# Patient Record
Sex: Female | Born: 1960 | Race: White | Hispanic: No | Marital: Married | Smoking: Former smoker
Health system: Southern US, Community
[De-identification: ages and names within clinical notes are randomized; demographics above are authoritative.]

## PROBLEM LIST (undated history)

## (undated) DIAGNOSIS — C801 Malignant (primary) neoplasm, unspecified: Secondary | ICD-10-CM

## (undated) DIAGNOSIS — F32A Depression, unspecified: Secondary | ICD-10-CM

## (undated) DIAGNOSIS — E039 Hypothyroidism, unspecified: Secondary | ICD-10-CM

## (undated) DIAGNOSIS — K5790 Diverticulosis of intestine, part unspecified, without perforation or abscess without bleeding: Secondary | ICD-10-CM

## (undated) DIAGNOSIS — K219 Gastro-esophageal reflux disease without esophagitis: Secondary | ICD-10-CM

## (undated) DIAGNOSIS — M1990 Unspecified osteoarthritis, unspecified site: Secondary | ICD-10-CM

## (undated) DIAGNOSIS — F329 Major depressive disorder, single episode, unspecified: Secondary | ICD-10-CM

## (undated) DIAGNOSIS — F419 Anxiety disorder, unspecified: Secondary | ICD-10-CM

## (undated) DIAGNOSIS — I10 Essential (primary) hypertension: Secondary | ICD-10-CM

## (undated) DIAGNOSIS — R51 Headache: Secondary | ICD-10-CM

## (undated) DIAGNOSIS — J449 Chronic obstructive pulmonary disease, unspecified: Secondary | ICD-10-CM

## (undated) DIAGNOSIS — R0602 Shortness of breath: Secondary | ICD-10-CM

## (undated) HISTORY — PX: OTHER SURGICAL HISTORY: SHX169

## (undated) HISTORY — PX: INNER EAR SURGERY: SHX679

## (undated) HISTORY — PX: HEMORRHOID SURGERY: SHX153

## (undated) HISTORY — PX: ABDOMINAL HYSTERECTOMY: SHX81

## (undated) HISTORY — PX: COLONOSCOPY W/ BIOPSIES AND POLYPECTOMY: SHX1376

---

## 2011-04-02 ENCOUNTER — Other Ambulatory Visit: Payer: Self-pay | Admitting: Obstetrics and Gynecology

## 2011-04-02 DIAGNOSIS — Z1231 Encounter for screening mammogram for malignant neoplasm of breast: Secondary | ICD-10-CM

## 2011-04-02 DIAGNOSIS — M1990 Unspecified osteoarthritis, unspecified site: Secondary | ICD-10-CM

## 2011-04-17 ENCOUNTER — Ambulatory Visit
Admission: RE | Admit: 2011-04-17 | Discharge: 2011-04-17 | Disposition: A | Discharge status: Home / Self Care | Payer: BC Managed Care – PPO | Source: Ambulatory Visit | Attending: Obstetrics and Gynecology | Admitting: Obstetrics and Gynecology

## 2011-04-17 DIAGNOSIS — M1990 Unspecified osteoarthritis, unspecified site: Secondary | ICD-10-CM

## 2011-04-17 DIAGNOSIS — Z1231 Encounter for screening mammogram for malignant neoplasm of breast: Secondary | ICD-10-CM

## 2013-01-14 ENCOUNTER — Inpatient Hospital Stay (HOSPITAL_COMMUNITY): Payer: BC Managed Care – PPO

## 2013-01-14 ENCOUNTER — Inpatient Hospital Stay (HOSPITAL_COMMUNITY)
Admission: RE | Admit: 2013-01-14 | Discharge: 2013-01-15 | DRG: 086 | Disposition: A | Discharge status: Home / Self Care | Payer: BC Managed Care – PPO | Source: Other Acute Inpatient Hospital | Attending: Internal Medicine | Admitting: Internal Medicine

## 2013-01-14 ENCOUNTER — Encounter (HOSPITAL_COMMUNITY): Payer: Self-pay | Admitting: *Deleted

## 2013-01-14 DIAGNOSIS — F3289 Other specified depressive episodes: Secondary | ICD-10-CM

## 2013-01-14 DIAGNOSIS — I10 Essential (primary) hypertension: Secondary | ICD-10-CM

## 2013-01-14 DIAGNOSIS — IMO0002 Reserved for concepts with insufficient information to code with codable children: Secondary | ICD-10-CM

## 2013-01-14 DIAGNOSIS — F329 Major depressive disorder, single episode, unspecified: Secondary | ICD-10-CM | POA: Diagnosis present

## 2013-01-14 DIAGNOSIS — J90 Pleural effusion, not elsewhere classified: Secondary | ICD-10-CM

## 2013-01-14 DIAGNOSIS — Z720 Tobacco use: Secondary | ICD-10-CM | POA: Diagnosis present

## 2013-01-14 DIAGNOSIS — C3490 Malignant neoplasm of unspecified part of unspecified bronchus or lung: Secondary | ICD-10-CM | POA: Diagnosis present

## 2013-01-14 DIAGNOSIS — K219 Gastro-esophageal reflux disease without esophagitis: Secondary | ICD-10-CM | POA: Diagnosis present

## 2013-01-14 DIAGNOSIS — R599 Enlarged lymph nodes, unspecified: Secondary | ICD-10-CM | POA: Diagnosis present

## 2013-01-14 DIAGNOSIS — F411 Generalized anxiety disorder: Secondary | ICD-10-CM | POA: Diagnosis present

## 2013-01-14 DIAGNOSIS — Z87891 Personal history of nicotine dependence: Secondary | ICD-10-CM

## 2013-01-14 DIAGNOSIS — R590 Localized enlarged lymph nodes: Secondary | ICD-10-CM

## 2013-01-14 DIAGNOSIS — E039 Hypothyroidism, unspecified: Secondary | ICD-10-CM

## 2013-01-14 DIAGNOSIS — R918 Other nonspecific abnormal finding of lung field: Secondary | ICD-10-CM

## 2013-01-14 HISTORY — DX: Gastro-esophageal reflux disease without esophagitis: K21.9

## 2013-01-14 HISTORY — DX: Major depressive disorder, single episode, unspecified: F32.9

## 2013-01-14 HISTORY — DX: Hypothyroidism, unspecified: E03.9

## 2013-01-14 HISTORY — DX: Essential (primary) hypertension: I10

## 2013-01-14 HISTORY — DX: Anxiety disorder, unspecified: F41.9

## 2013-01-14 HISTORY — DX: Depression, unspecified: F32.A

## 2013-01-14 LAB — COMPREHENSIVE METABOLIC PANEL
ALT: 48 U/L — ABNORMAL HIGH (ref 0–35)
Alkaline Phosphatase: 100 U/L (ref 39–117)
CO2: 25 mEq/L (ref 19–32)
Chloride: 95 mEq/L — ABNORMAL LOW (ref 96–112)
GFR calc Af Amer: >90 mL/min (ref >90)
GFR calc non Af Amer: >90 mL/min (ref >90)
Glucose, Bld: 86 mg/dL (ref 70–99)
Potassium: 4 mEq/L (ref 3.5–5.1)
Sodium: 133 mEq/L — ABNORMAL LOW (ref 135–145)
Total Bilirubin: 0.2 mg/dL — ABNORMAL LOW (ref 0.3–1.2)
Total Protein: 6.2 g/dL (ref 6.0–8.3)

## 2013-01-14 LAB — BODY FLUID CELL COUNT WITH DIFFERENTIAL
Eos, Fluid: 2 %
Monocyte-Macrophage-Serous Fluid: 4 % — ABNORMAL LOW (ref 50–90)
Neutrophil Count, Fluid: 38 % — ABNORMAL HIGH (ref 0–25)
Total Nucleated Cell Count, Fluid: 1160 cu mm — ABNORMAL HIGH (ref 0–1000)

## 2013-01-14 LAB — CBC WITH DIFFERENTIAL/PLATELET
Basophils Absolute: 0 10*3/uL (ref 0.0–0.1)
Basophils Relative: 0 % (ref 0–1)
HCT: 38.2 % (ref 36.0–46.0)
MCHC: 33.8 g/dL (ref 30.0–36.0)
Monocytes Absolute: 1.5 10*3/uL — ABNORMAL HIGH (ref 0.1–1.0)
Neutro Abs: 11.5 10*3/uL — ABNORMAL HIGH (ref 1.7–7.7)
Platelets: 290 10*3/uL (ref 150–400)
RDW: 13.8 % (ref 11.5–15.5)

## 2013-01-14 LAB — TSH: TSH: 0.824 u[IU]/mL (ref 0.350–4.500)

## 2013-01-14 LAB — MRSA PCR SCREENING: MRSA by PCR: NEGATIVE

## 2013-01-14 MED ORDER — ONDANSETRON HCL 4 MG/2ML IJ SOLN
4.0000 mg | Freq: Four times a day (QID) | INTRAMUSCULAR | Status: DC | PRN
  Administered 2013-01-15: 4 mg via INTRAVENOUS
  Filled 2013-01-14: qty 2

## 2013-01-14 MED ORDER — OXYBUTYNIN CHLORIDE ER 10 MG PO TB24
10.0000 mg | ORAL_TABLET | Freq: Every day | ORAL | Status: DC
  Administered 2013-01-14: 10 mg via ORAL
  Filled 2013-01-14 (×2): qty 1

## 2013-01-14 MED ORDER — IPRATROPIUM BROMIDE 0.02 % IN SOLN
0.5000 mg | Freq: Four times a day (QID) | RESPIRATORY_TRACT | Status: DC
  Administered 2013-01-14 – 2013-01-15 (×6): 0.5 mg via RESPIRATORY_TRACT
  Filled 2013-01-14 (×6): qty 2.5

## 2013-01-14 MED ORDER — ALBUTEROL SULFATE (5 MG/ML) 0.5% IN NEBU: 2.5000 mg | INHALATION_SOLUTION | RESPIRATORY_TRACT | Status: DC | PRN

## 2013-01-14 MED ORDER — HYDROMORPHONE HCL PF 1 MG/ML IJ SOLN
2.0000 mg | INTRAMUSCULAR | Status: DC | PRN
  Administered 2013-01-14: 1 mg via INTRAVENOUS
  Filled 2013-01-14: qty 1

## 2013-01-14 MED ORDER — ENSURE COMPLETE PO LIQD
237.0000 mL | Freq: Two times a day (BID) | ORAL | Status: DC
  Administered 2013-01-15 (×2): 237 mL via ORAL

## 2013-01-14 MED ORDER — HYDROMORPHONE HCL PF 1 MG/ML IJ SOLN: 1.0000 mg | INTRAMUSCULAR | Status: DC | PRN

## 2013-01-14 MED ORDER — HYDROCODONE-ACETAMINOPHEN 5-325 MG PO TABS
1.0000 | ORAL_TABLET | Freq: Four times a day (QID) | ORAL | Status: DC | PRN
  Administered 2013-01-14 – 2013-01-15 (×4): 2 via ORAL
  Filled 2013-01-14 (×4): qty 2

## 2013-01-14 MED ORDER — HYDRALAZINE HCL 20 MG/ML IJ SOLN: 10.0000 mg | INTRAMUSCULAR | Status: DC | PRN

## 2013-01-14 MED ORDER — SODIUM CHLORIDE 0.9 % IV SOLN
INTRAVENOUS | Status: AC
  Administered 2013-01-14: 06:00:00 via INTRAVENOUS

## 2013-01-14 MED ORDER — ACETAMINOPHEN 325 MG PO TABS: 650.0000 mg | ORAL_TABLET | Freq: Four times a day (QID) | ORAL | Status: DC | PRN

## 2013-01-14 MED ORDER — AZITHROMYCIN 500 MG IV SOLR
500.0000 mg | INTRAVENOUS | Status: DC
  Administered 2013-01-14 – 2013-01-15 (×2): 500 mg via INTRAVENOUS
  Filled 2013-01-14 (×3): qty 500

## 2013-01-14 MED ORDER — ACETAMINOPHEN 650 MG RE SUPP: 650.0000 mg | Freq: Four times a day (QID) | RECTAL | Status: DC | PRN

## 2013-01-14 MED ORDER — DEXTROSE 5 % IV SOLN
1.0000 g | INTRAVENOUS | Status: DC
  Administered 2013-01-14 – 2013-01-15 (×2): 1 g via INTRAVENOUS
  Filled 2013-01-14 (×3): qty 10

## 2013-01-14 MED ORDER — SODIUM CHLORIDE 0.9 % IJ SOLN
3.0000 mL | Freq: Two times a day (BID) | INTRAMUSCULAR | Status: DC
  Administered 2013-01-14 – 2013-01-15 (×2): 3 mL via INTRAVENOUS

## 2013-01-14 MED ORDER — ADULT MULTIVITAMIN W/MINERALS CH
1.0000 | ORAL_TABLET | Freq: Every day | ORAL | Status: DC
  Administered 2013-01-14 – 2013-01-15 (×2): 1 via ORAL
  Filled 2013-01-14 (×2): qty 1

## 2013-01-14 MED ORDER — LEVOTHYROXINE SODIUM 50 MCG PO TABS
50.0000 ug | ORAL_TABLET | Freq: Every day | ORAL | Status: DC
  Administered 2013-01-15: 50 ug via ORAL
  Filled 2013-01-14 (×2): qty 1

## 2013-01-14 MED ORDER — ALBUTEROL SULFATE (5 MG/ML) 0.5% IN NEBU
2.5000 mg | INHALATION_SOLUTION | Freq: Four times a day (QID) | RESPIRATORY_TRACT | Status: DC
  Administered 2013-01-14 – 2013-01-15 (×6): 2.5 mg via RESPIRATORY_TRACT
  Filled 2013-01-14 (×6): qty 0.5

## 2013-01-14 MED ORDER — IOHEXOL 300 MG/ML  SOLN
80.0000 mL | Freq: Once | INTRAMUSCULAR | Status: AC | PRN
  Administered 2013-01-14: 80 mL via INTRAVENOUS

## 2013-01-14 MED ORDER — ATENOLOL 50 MG PO TABS
75.0000 mg | ORAL_TABLET | Freq: Two times a day (BID) | ORAL | Status: DC
  Administered 2013-01-14 – 2013-01-15 (×2): 75 mg via ORAL
  Filled 2013-01-14 (×3): qty 1

## 2013-01-14 MED ORDER — ONDANSETRON HCL 4 MG PO TABS: 4.0000 mg | ORAL_TABLET | Freq: Four times a day (QID) | ORAL | Status: DC | PRN

## 2013-01-14 NOTE — H&P (Signed)
[facility] History and Physical  Jodi Shepherd WUJ:[id number] DOB: 1960/10/01 DOA: 01/14/2013  Referring physician: Patient was transferred from [place]. PCP: No PCP Per Patient   Chief Complaint: Shortness of breath and bilateral bloody pleural effusions.  HPI: Jodi Shepherd is a 52 y.o. female with history of hypertension, hypothyroidism and depression was admitted at [facility] in [place] for shortness of breath and was found to have bilateral pleural effusion loculated with bilateral airspace disease. Patient was started on empiric antibiotics and had CT chest the results of which are not available and eventually patient underwent right-sided thoracentesis followed by left-sided thoracentesis each day and patient has been transferred to [facility] for further management. As per the report received patient had bloody pleural effusion. Patient states that she has been having shortness of breath for some time over the last few weeks but acutely worsened since May 13. Patient also has been having pleuritic type of chest pain. She went to the hospital 3 days ago. Patient denies any productive cough or any fever chills. Has noticed 30 pounds weight loss in last 3 months. Denies any nausea vomiting diarrhea. Has not had any joint swellings. Denies any insect bite or any foreign travel. Had no contact with people with TB.   Labs done at the other hospital showed hemoglobin of 4.2 WBC of 14.3 platelets of 310 creatinine 0.6 glucose 101 albumin of 2 and chest x-ray was read as bilateral loculated pleural effusion with bilateral airspace disease. CT chest results are not available.  Review of Systems: As presented in the history of presenting illness, rest negative.  Past Medical History  Diagnosis Date  . Hypertension   . Hypothyroidism   . Anxiety   . Depression   . GERD (gastroesophageal reflux disease)    Past Surgical History  Procedure Laterality Date  . Abdominal  hysterectomy    . Inner ear surgery     Social History:  reports that she quit smoking about 2 weeks ago. Her smoking use included Cigarettes. She smoked 0.00 packs per day. She has never used smokeless tobacco. She reports that she does not drink alcohol or use illicit drugs. Lives at home. where does patient live-- Can do ADLs. Can patient participate in ADLs?  No Known Allergies  Family History  Problem Relation Age of Onset  . Diabetes Mellitus II Father       Prior to Admission medications   Not on File   Physical Exam: Filed Vitals:   01/14/13 0236 01/14/13 0242  BP: 137/69   Pulse: 71 72  Temp: 98.7 F (37.1 C)   TempSrc: Oral   Resp: 24 16  Height: 5\' 5"  (1.651 m)   Weight: 76 kg (167 lb 8.8 oz)   SpO2: 89% 91%     General:  Well-developed and nourished.  Eyes: Anicteric no pallor.  ENT: No discharge from the ears eyes nose mouth.  Neck: No mass felt.  Cardiovascular: S1-S2 heard.  Respiratory: No rhonchi or crepitations.  Abdomen: Soft nontender bowel sounds present.  Skin: No rash.  Musculoskeletal: No edema.  Psychiatric: Appears normal.  Neurologic: Alert awake oriented to time place and person. Moves all extremities.  Labs on Admission:  Basic Metabolic Panel: No results found for this basename: NA, K, CL, CO2, GLUCOSE, BUN, CREATININE, CALCIUM, MG, PHOS,  in the last 168 hours Liver Function Tests: No results found for this basename: AST, ALT, ALKPHOS, BILITOT, PROT, ALBUMIN,  in the last 168 hours No results found for  this basename: LIPASE, AMYLASE,  in the last 168 hours No results found for this basename: AMMONIA,  in the last 168 hours CBC: No results found for this basename: WBC, NEUTROABS, HGB, HCT, MCV, PLT,  in the last 168 hours Cardiac Enzymes: No results found for this basename: CKTOTAL, CKMB, CKMBINDEX, TROPONINI,  in the last 168 hours  BNP (last 3 results) No results found for this basename: PROBNP,  in the last 8760  hours CBG: No results found for this basename: GLUCAP,  in the last 168 hours  Radiological Exams on Admission: No results found.   Assessment/Plan Principal Problem:   Pleural effusion, bilateral Active Problems:   HTN (hypertension)   Depression   Hypothyroidism   Tobacco abuse   1. Bilateral bloody pleural effusion loculated with possible bilateral airspace disease and mediastinal adenopathy - at this time I have discussed with on-call pulmonologist. Patient will be continued on antibiotics. Patient's thoracentesis fluid is available done from the hospital. This will be sent for cytology. Further recommendations per pulmonary. I have ordered a repeat chest x-ray. We need to get patient's blood culture reports and CT reports from [facility] in [place]. 2. Hypertension - need to verify home medications and start. For now I have placed patient on when necessary IV hydralazine for systolic blood pressure more than 160. 3. Hypothyroidism - continue Synthroid check TSH. 4. History of depression - continue home medications. 5. Tobacco abuse - patient recently quit after patient was having shortness of breath.  Patient's labs and chest x-ray are pending.    Code Status: Full code.  Family Communication: None.  Disposition Plan: Admit to inpatient.    Jazz Biddy N. [facility] Pager [phone].  If 7PM-7AM, please contact night-coverage [email or web address] Password [facility] 01/14/2013, 4:26 AM

## 2013-01-14 NOTE — Progress Notes (Signed)
[facility] Progress Note  TEAM 1 - Stepdown/ICU TEAM   Talana Slatten ZOX:[id number] DOB: 10-Jun-1961 DOA: 01/14/2013 PCP: No PCP Per Patient  Brief narrative: Jodi Shepherd is a 52 y.o. female with history of hypertension, hypothyroidism and depression was admitted at [facility] in [place] for shortness of breath and was found to have bilateral pleural effusion loculated with bilateral airspace disease. Patient was started on empiric antibiotics and had CT chest the results of which are not available and eventually patient underwent right-sided thoracentesis followed by left-sided thoracentesis each day and patient has been transferred to [facility] for further management. As per the report received patient had bloody pleural effusion. Patient states that she has been having shortness of breath for some time over the last few weeks but acutely worsened since May 13. Patient also has been having pleuritic type of chest pain. She went to the hospital 3 days ago. Patient denies any productive cough or any fever chills. Has noticed 30 pounds weight loss in last 3 months. Denies any nausea vomiting diarrhea. Has not had any joint swellings. Denies any insect bite or any foreign travel. Had no contact with people with TB.   Assessment/Plan: Principal Problem:   Pleural effusion, bilateral/ lymphadenopathy/ lung mass with mets - pt understands that diagnosis is likely cancer and would like to go home from the hospital and have outpt work up and treatment- not very keen on getting chemo but amenable to radiation if needed.  - await cytology from pleural fluid -understands that pleural fluid may re-collect rapidly - titrate down O2- discussed need for home O2 with case manager  Active Problems:   HTN (hypertension) -resume Atenolol    Hypothyroidism -resume synthroid    Tobacco abuse -quit 2 wks ago    Code Status: full code Family Communication: none Disposition Plan:  follow in SDU  Consultants: pulm  Procedures: none  Antibiotics: Rocephin/ Zithromax   DVT prophylaxis: SCDs  HPI/Subjective: Cough present/ no significant dyspnea or pain today   Objective: Blood pressure 150/73, pulse 85, temperature 97.8 F (36.6 C), temperature source Oral, resp. rate 11, height 5\' 5"  (1.651 m), weight 76 kg (167 lb 8.8 oz), SpO2 96.00%.  Intake/Output Summary (Last 24 hours) at 01/14/13 2152 Last data filed at 01/14/13 2000  Gross per 24 hour  Intake 1307.5 ml  Output    550 ml  Net  757.5 ml     Exam: General: No acute respiratory distress Lungs: b/l basilar crackles Cardiovascular: Regular rate and rhythm without murmur gallop or rub normal S1 and S2 Abdomen: Nontender, nondistended, soft, bowel sounds positive, no rebound, no ascites, no appreciable mass Extremities: No significant cyanosis, clubbing, or edema bilateral lower extremities  Data Reviewed: Basic Metabolic Panel:  Recent Labs Lab 01/14/13 0530  NA 133*  K 4.0  CL 95*  CO2 25  GLUCOSE 86  BUN 8  CREATININE 0.34*  CALCIUM 8.7   Liver Function Tests:  Recent Labs Lab 01/14/13 0530  AST 25  ALT 48*  ALKPHOS 100  BILITOT 0.2*  PROT 6.2  ALBUMIN 2.1*   No results found for this basename: LIPASE, AMYLASE,  in the last 168 hours No results found for this basename: AMMONIA,  in the last 168 hours CBC:  Recent Labs Lab 01/14/13 0530  WBC 14.7*  NEUTROABS 11.5*  HGB 12.9  HCT 38.2  MCV 85.5  PLT 290   Cardiac Enzymes: No results found for this basename: CKTOTAL, CKMB, CKMBINDEX, TROPONINI,  in the last 168 hours BNP (last 3 results) No results found for this basename: PROBNP,  in the last 8760 hours CBG: No results found for this basename: GLUCAP,  in the last 168 hours  Recent Results (from the past 240 hour(s))  MRSA PCR SCREENING     Status: None   Collection Time    01/14/13  2:37 AM      Result Value Range Status   MRSA by PCR NEGATIVE  NEGATIVE  Final   Comment:            The GeneXpert MRSA Assay (FDA     approved for NASAL specimens     only), is one component of a     comprehensive MRSA colonization     surveillance program. It is not     intended to diagnose MRSA     infection nor to guide or     monitor treatment for     MRSA infections.     Studies:  Recent x-ray studies have been reviewed in detail by the Attending Physician  Scheduled Meds:  Scheduled Meds: . albuterol  2.5 mg Nebulization Q6H  . azithromycin  500 mg Intravenous Q24H  . cefTRIAXone (ROCEPHIN)  IV  1 g Intravenous Q24H  . feeding supplement  237 mL Oral BID BM  . ipratropium  0.5 mg Nebulization Q6H  . multivitamin with minerals  1 tablet Oral Daily  . sodium chloride  3 mL Intravenous Q12H   Continuous Infusions: . sodium chloride 50 mL/hr at 01/14/13 1900    Time spent on care of this patient: 35 min   Dulse Rutan,MD [phone]  [facility] Office  [phone] Pager - Text Page per Loretha Stapler as per below:  On-Call/Text Page:      [email or web address]      password [email or web address]  If 7PM-7AM, please contact night-coverage [email or web address] Password [facility] 01/14/2013, 9:52 PM   LOS: 0 days

## 2013-01-14 NOTE — Progress Notes (Signed)
INITIAL NUTRITION ASSESSMENT  DOCUMENTATION CODES Per approved criteria  -Not Applicable   INTERVENTION: Provide Ensure Complete po BID, each supplement provides 350 kcal and 13 grams of protein. Provide Multivitamin with minerals daily  NUTRITION DIAGNOSIS: Inadequate oral intake related to poor appetite as evidenced by pt's report of no PO intake today and 1 meal yesterday.   Goal: Pt to meet >/= 90% of their estimated nutrition needs  Monitor:  PO intake Weight Labs  Reason for Assessment: Malnutrition Screening Tool, Score of 4  52 y.o. female  Admitting Dx: Pleural effusion, bilateral  ASSESSMENT: 52 y.o. female with history of hypertension, hypothyroidism and depression was admitted at [facility] in [place] for shortness of breath and was found to have bilateral pleural effusion loculated with bilateral airspace disease. Patient was started on empiric antibiotics and had CT chest the results of which are not available and eventually patient underwent right-sided thoracentesis followed by left-sided thoracentesis each day and patient has been transferred to [facility] for further management.   Pt reports that she has had a poor appetite for the past 2 days with nothing to eat today and only 1 meal yesterday. Pt states that she never eats much but, is eating less than usual. Pt reports that her usual body weight is 170 lbs. Pt states that she eats healthy at home and she has no further questions or concerns at this time.   Height: Ht Readings from Last 1 Encounters:  01/14/13 5\' 5"  (1.651 m)    Weight: Wt Readings from Last 1 Encounters:  01/14/13 167 lb 8.8 oz (76 kg)    Ideal Body Weight: 125 lbs  % Ideal Body Weight: 134%  Wt Readings from Last 10 Encounters:  01/14/13 167 lb 8.8 oz (76 kg)    Usual Body Weight: 170 lbs  % Usual Body Weight: 98%  BMI:  Body mass index is 27.88 kg/(m^2).  Estimated Nutritional Needs: Kcal: 1900-2050 Protein:  76-91 grams Fluid: 2.3 L  Skin: dry, intact; incision on back  Diet Order: Cardiac  EDUCATION NEEDS: -No education needs identified at this time   Intake/Output Summary (Last 24 hours) at 01/14/13 1354 Last data filed at 01/14/13 0951  Gross per 24 hour  Intake  442.5 ml  Output    550 ml  Net -107.5 ml    Last BM: 5/24   Labs:   Recent Labs Lab 01/14/13 0530  NA 133*  K 4.0  CL 95*  CO2 25  BUN 8  CREATININE 0.34*  CALCIUM 8.7  GLUCOSE 86    CBG (last 3)  No results found for this basename: GLUCAP,  in the last 72 hours  Scheduled Meds: . albuterol  2.5 mg Nebulization Q6H  . azithromycin  500 mg Intravenous Q24H  . cefTRIAXone (ROCEPHIN)  IV  1 g Intravenous Q24H  . ipratropium  0.5 mg Nebulization Q6H  . sodium chloride  3 mL Intravenous Q12H    Continuous Infusions: . sodium chloride 50 mL/hr at 01/14/13 0533    Past Medical History  Diagnosis Date  . Hypertension   . Hypothyroidism   . Anxiety   . Depression   . GERD (gastroesophageal reflux disease)     Past Surgical History  Procedure Laterality Date  . Abdominal hysterectomy    . Inner ear surgery      Ian Malkin RD, LDN Inpatient Clinical Dietitian Pager: [phone] After Hours Pager: [phone]

## 2013-01-14 NOTE — Progress Notes (Signed)
PT transferred from [facility] via [facility]. Pt Ax4, able to move self to bed, sats 92% on 5L O2, all other VVS. Pt transferred with approximately 1500cc of thoracic fluid from thoracentesis earlier on 5/23. Pt having 8/10 mid upper back pain. Admitting MD paged. Will continue to monitor.

## 2013-01-14 NOTE — Consult Note (Signed)
PULMONARY  / CRITICAL CARE MEDICINE  Name: Jodi Shepherd MRN: [id number] DOB: 1961/04/03    ADMISSION DATE:  01/14/2013 CONSULTATION DATE:  01/14/13  REFERRING MD :  Eduard Clos, MD PRIMARY SERVICE: [facility]  CHIEF COMPLAINT:  Pleural Effusions  BRIEF PATIENT DESCRIPTION: 52 yo former smoker with bilateral lymphocytic bloody pleural effusions.   CULTURES: None  ANTIBIOTICS: Ceftriaxone 5/21 >> Azithromycin 5/21 >>>  HISTORY OF PRESENT ILLNESS:  52 yo F transferred from OSH for evaluation and management of bilateral pleural effusions.  She presented to [facility] in [place] on 5/21. CXR showed bilateral pleural effusions.  She was started on ceftriaxone and azithromycin.  CT scan showed "multiple pulmonary and hilar masses with pleural effusions".  Right thora performed on 5/22 with 1300 ml of bloody fluid removed.  Left performed yesterday and pleural fluid was sent with her on transfer which appears serosanguinous.   Her shortness of breath started at the beginning of this month and progressed.  She has had ~ 25 lbs of weight loss over 6 months and has had pleuritic chest pain. She has a chronic cough which has not worsened and not been productive. She denies fever, chills, night sweats, or hemoptysis.  She denies TB and HIV risk factors or known exposure.  She denies recent sick contacts. 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 She quit smoking earlier this month.  PAST MEDICAL HISTORY :  Past Medical History  Diagnosis Date  . Hypertension   . Hypothyroidism   . Anxiety   . Depression   . GERD (gastroesophageal reflux disease)    Past Surgical History  Procedure Laterality Date  . Abdominal hysterectomy    . Inner ear surgery     Prior to Admission medications   Not on File   No Known Allergies  FAMILY HISTORY:  Family History  Problem Relation Age of Onset  . Diabetes Mellitus II Father    SOCIAL  HISTORY:  She reports that she quit smoking about 2 weeks ago. Her smoking use included Cigarettes. She smoked up to 1.5 packs per day. She has never used smokeless tobacco. She reports that she does not drink alcohol or use illicit drugs.  REVIEW OF SYSTEMS:  Review of 10 systems negative except as listed in HPI  SUBJECTIVE:   VITAL SIGNS: Temp:  [98.7 F (37.1 C)] 98.7 F (37.1 C) (05/24 0236) Pulse Rate:  [71-72] 72 (05/24 0242) Resp:  [16-24] 16 (05/24 0242) BP: (137)/(69) 137/69 mmHg (05/24 0236) SpO2:  [89 %-91 %] 91 % (05/24 0242) Weight:  [76 kg (167 lb 8.8 oz)] 76 kg (167 lb 8.8 oz) (05/24 0236)   PHYSICAL EXAMINATION: General:  Sitting in bed, NAD Neuro:  A&Ox3, no focal deficits HEENT:  Sclera clear, MMM, no oral lesions, EOMI Neck:Supple, no thyromegaly or cervical LAD Cardiovascular:  RRR no m/r/g Lungs: Decreased in bases Abdomen:  Soft, NT, ND, normal BS Musculoskeletal:  Joints wnl Skin:  No rash   LABS: No results found for this basename: HGB, WBC, PLT, NA, K, CL, CO2, GLUCOSE, BUN, CREATININE, CALCIUM, MG, PHOS, AST, ALT, ALKPHOS, BILITOT, PROT, ALBUMIN, APTT, INR, LATICACIDVEN, TROPONINI, PROCALCITON, PROBNP, O2SATVEN, PHART, PCO2ART, PO2ART,  in the last 168 hours No results found for this basename: GLUCAP,  in the last 168 hours  CXR: pending  OSH CXR report - loculated bilateral pleural effusions with bibasilar airspace disease  CT scan report not provided - per  notes  "multiple pulmonary and hilar masses with pleural effusions"  Disc with OSH imaging does not seem to have any imaged  OSH pleural fluid cell count Red, slightly cloudy 6.7 WBC 38% PMNs 52% lymphs  ASSESSMENT / PLAN:  52 yo F with bilateral bloody lymphocytic pleural effusions.  Given OSH radiology reports and clinical history appear most likely to be malignant.  Alternative DDX that can cause this type of effusion include parapneumonic, TB and rhematologic disease which all seem  less likely based on history and reports.  We will need to repeat imaging here to confirm OSH findings.  Recs: - Agree with CAP coverage for now - Fluid sent from OSH sent for cytology, cell count, LD, protein and ADA. - Will repeat CT scan as previous unavailable and to evaulate for improvement following previous thoracenteses. - If cytology is positive would provide diagnosis and staging.  I have personally obtained a history, examined the patient, evaluated laboratory and imaging results, formulated the assessment and plan and placed orders.   Daniel Ritthaler M.D. Pulmonary and Critical Care Medicine [facility] Pager: [phone]  01/14/2013, 5:42 AM

## 2013-01-14 NOTE — Progress Notes (Signed)
Patient desat 87-88% on 4L O2 via [place] increased to 5L via [place] with minimal change 87%. Increased to venti mask and escalated 40% O2 to 50%. Patient SpO2 response 88-89%.  Currently patient on non rebreather SpO2 94-96%. Patient not exhibiting overt signs of distress and denies any difficulty breathing. Notified respiratory for additional interventions and notified Dr. Butler Denmark of assessment and interventions

## 2013-01-15 ENCOUNTER — Inpatient Hospital Stay (HOSPITAL_COMMUNITY): Payer: BC Managed Care – PPO

## 2013-01-15 DIAGNOSIS — R599 Enlarged lymph nodes, unspecified: Secondary | ICD-10-CM

## 2013-01-15 DIAGNOSIS — R222 Localized swelling, mass and lump, trunk: Secondary | ICD-10-CM

## 2013-01-15 DIAGNOSIS — C3490 Malignant neoplasm of unspecified part of unspecified bronchus or lung: Secondary | ICD-10-CM | POA: Diagnosis present

## 2013-01-15 MED ORDER — ALPRAZOLAM 0.25 MG PO TABS
0.2500 mg | ORAL_TABLET | Freq: Three times a day (TID) | ORAL | Status: DC | PRN
  Administered 2013-01-15 (×2): 0.25 mg via ORAL
  Filled 2013-01-15 (×2): qty 1

## 2013-01-15 MED ORDER — ALPRAZOLAM 0.25 MG PO TABS: 0.2500 mg | ORAL_TABLET | Freq: Three times a day (TID) | ORAL | Status: AC | PRN

## 2013-01-15 MED ORDER — OXYCODONE HCL 5 MG PO TABS
5.0000 mg | ORAL_TABLET | ORAL | Status: DC | PRN
  Administered 2013-01-15: 10 mg via ORAL
  Filled 2013-01-15: qty 2

## 2013-01-15 NOTE — Progress Notes (Signed)
Subjective: Pt stable overnight.  No increased wob.  2  Objective: Vital signs in last 24 hours: Blood pressure 119/64, pulse 63, temperature 98.3 F (36.8 C), temperature source Oral, resp. rate 18, height 5\' 5"  (1.651 m), weight 76 kg (167 lb 8.8 oz), SpO2 92.00%.  Intake/Output from previous day: 05/24 0701 - 05/25 0700 In: 957.5 [P.O.:360; I.V.:597.5] Out: 0    Physical Exam:   overweight female in nad Nose without purulence or d/c OP clear Neck with +left cervical LN, no TMG Chest with crackles both bases, no wheezing Cor with rrr abd soft, nt/nd, bs+ LE without edema, no cyanosis Alert and oriented, moves all 4.    Lab Results:  Recent Labs  01/14/13 0530  WBC 14.7*  HGB 12.9  HCT 38.2  PLT 290   BMET  Recent Labs  01/14/13 0530  NA 133*  K 4.0  CL 95*  CO2 25  GLUCOSE 86  BUN 8  CREATININE 0.34*  CALCIUM 8.7    Studies/Results: Ct Chest W Contrast  01/14/2013   *RADIOLOGY REPORT*  Clinical Data: Malignancy suspected.  Abnormal radiograph.  CT CHEST WITH CONTRAST  Technique:  Multidetector CT imaging of the chest was performed following the standard protocol during bolus administration of intravenous contrast.  Contrast: 80mL OMNIPAQUE IOHEXOL 300 MG/ML  SOLN  Comparison: None.  Findings: Abnormal cervical and mediastinal adenopathy is present. 2.0 cm right level IV cervical node.  1.8 cm left level IV cervical node.  Several mediastinal nodes at the thoracic inlet.  2.9 cm right nodal mass short axis diameter on image 19.  1.7 cm prevascular node on image 20.  2.6 cm prevascular node on image 24. 3.8 cm subcarinal lymph node mass.  2.4 cm left hilar lymph node. 1.9 cm precarinal lymph node.  1.2 cm right hilar node.  10 mm right pericardiophrenic angle node. Bilateral and large supraclavicular nodes.  Abnormal adenopathy about the celiac axis in the upper abdomen is suspected.  Bilateral small adrenal nodules are worrisome for metastatic disease.  Bilateral  pleural effusions are present.  They extend anteriorly suggesting complicated pleural effusion that one would have with malignancy.  There is a small loculated retrosternal fluid collection on image 22 extending inferiorly of unknown significance.  This may represent a segment of the loculated pleural effusion.  Advanced centrilobular emphysema.  There is volume loss at both lung bases associated with heterogeneous pulmonary opacities. There is no dominant lung mass.  The left hilar mass designated  as a lymph node may actually represent the primary lung mass.  No destructive bone lesion.  Ground-glass opacity and interlobular septal thickening in both upper lobes anteriorly is present worse on the left.  Lymphangitic spread of tumor cannot be excluded.  IMPRESSION: The above findings are compatible with advanced thoracic malignancy characterized by mediastinal adenopathy, neck adenopathy, abdominal metastatic disease, pulmonary disease, and pleural disease.  Neck adenopathy is amenable to percutaneous biopsy.   Original Report Authenticated By: Jolaine Click, M.D.   Dg Chest Port 1 View  01/15/2013   *RADIOLOGY REPORT*  Clinical Data: Follow-up effusions  PORTABLE CHEST - 1 VIEW  Comparison: 01/14/2013  Findings: Artifact overlies the chest.  Bilateral pleural effusions persist, the same more minimally larger.  There is associated atelectasis in the lower lungs.  Vascularity shows venous hypertension possibly with early interstitial edema.  The aorta is unfolded.  There is mediastinal widening.  IMPRESSION: Persistent bilateral effusions, possibly minimally larger.  Lower lobe atelectasis and/or infiltrate.  Mediastinal widening.   Original Report Authenticated By: Paulina Fusi, M.D.   Dg Chest Port 1 View  01/14/2013   *RADIOLOGY REPORT*  Clinical Data: Bilateral pleural effusions.  PORTABLE CHEST - 1 VIEW 01/14/2013 0609 hours:  Comparison: None.  Findings: Cardiac silhouette upper normal in size for AP  portable technique. Thoracic aorta tortuous and atherosclerotic.  Right paratracheal opacity which may represent an ectatic ascending thoracic aorta.  Mild pulmonary venous hypertension without overt edema.  Large bilateral pleural effusions and associated consolidation in the lower lobes.  Patchy airspace opacities in the lingula and right middle lobe.  Biapical pleuroparenchymal scarring.  IMPRESSION:  1.  Large bilateral pleural effusions with associated atelectasis and/or pneumonia in the lower lobes.  Patchy pneumonia is suspected in the lingula and right middle lobe. 2.  Right paratracheal opacity which may represent an ectatic ascending thoracic aorta.   Original Report Authenticated By: Hulan Saas, M.D.    Assessment/Plan:  Extensive Mediastinal LN with bilat effusion This most likely represents small cell lung cancer clinically, although would also consider lymphoma.  Await path on pleural fluid (exudative).  She has left cervical LN that is large and easily palpable on exam.  This could be easily biopsied if needed.   -f/u pleural fluid cytology.  If +, refer to oncology -if pleural fluid cytology negative, will need FNA/core bx of left cervical node for tissue diagnosis.   Will check again on Tues.  Please call if problems arise.    Barbaraann Share, M.D. 01/15/2013, 12:36 PM

## 2013-01-15 NOTE — Progress Notes (Signed)
Discharge instructions given to pt and her husband. Verbalized and taught-back understanding of activity level, diet, follow-up appts, home meds, when to call MD/EMS and O2 use. Renette Butters, Viona Gilmore

## 2013-01-15 NOTE — Progress Notes (Addendum)
Pt to discharge home with needs for home oxygen.  The address in EPIC is correct, [address], [place], [place] [address].  That is in [place]. The phone number to use [phone].  Alternate number is [phone].  Pt reports that [employer] is in her area. Will start with that company for her.   1620pm:  Faxed orders and other requested documentation to [employer] in [place].  All documents sent to 3300 unit for RN in case there is an issue before discharge since it is the end of the day. [place] office of [employer] notified of need for a portable tank for d/c home.  They are planning delivery in next hour.  Assigned RN is aware of all of this and patient is aware that they should call the [place] office when they are ready to drive home so that the technician can meet them at their home upon their arrival to set up the oxygen concentrator.

## 2013-01-15 NOTE — Progress Notes (Signed)
Pt's O2 sat 87% at rest while on room air. With ambulation, pt's sat 83% on RA, 86% on 1L [place] O2, and 92% on 2L [place] O2. Pt's sat 93% at rest on 2L [place]. Renette Butters, Viona Gilmore

## 2013-01-17 ENCOUNTER — Telehealth: Payer: Self-pay

## 2013-01-17 NOTE — Telephone Encounter (Signed)
Patient husband calling requesting results from lung fluid labs done while in hospital 01/14/13 for Bil. PE. Message to be forwarded to Dr Shelle Iron to advise if final results are available for all that was ordered in hospital.

## 2013-01-17 NOTE — Telephone Encounter (Signed)
Please let pt know that I checked this afternoon, and are still not back. Will check again in am.

## 2013-01-18 NOTE — Telephone Encounter (Signed)
Left detailed message on machine in regards to labs

## 2013-01-18 NOTE — Progress Notes (Signed)
Utilization review completed.  P.J. Quinnten Calvin,RN,BSN Case Manager [phone]  

## 2013-01-19 ENCOUNTER — Telehealth: Payer: Self-pay | Admitting: *Deleted

## 2013-01-19 NOTE — Telephone Encounter (Signed)
I spoke with pt and advised her Jodi Shepherd was not in this afternoon but will get message over to him when he returns tomorrow. Please advise Jodi Shepherd thanks

## 2013-01-19 NOTE — Telephone Encounter (Signed)
Error.  Duplicate message.  Holly D Pryor °- °

## 2013-01-19 NOTE — Telephone Encounter (Signed)
Pt called checking on status of results.  Can be reached at [phone] or w/in the hour at [phone]. Pt is calling to get results from [facility] or his nurse. Antionette Fairy

## 2013-01-20 ENCOUNTER — Other Ambulatory Visit: Payer: Self-pay | Admitting: Pulmonary Disease

## 2013-01-20 ENCOUNTER — Telehealth: Payer: Self-pay | Admitting: Pulmonary Disease

## 2013-01-20 DIAGNOSIS — R918 Other nonspecific abnormal finding of lung field: Secondary | ICD-10-CM

## 2013-01-20 DIAGNOSIS — J90 Pleural effusion, not elsewhere classified: Secondary | ICD-10-CM

## 2013-01-20 MED ORDER — HYDROCODONE-ACETAMINOPHEN 5-325 MG PO TABS: ORAL_TABLET | ORAL | Status: DC

## 2013-01-20 NOTE — Telephone Encounter (Signed)
Please call in to [phone] vicodin 5mg  tabs, take 1-2 every 6 hrs if needed for pain #30 no fills.

## 2013-01-20 NOTE — Telephone Encounter (Signed)
Called and spoke with pt and she is aware that the vicodin has been called to the new pharmacy since [facility] has a power outage.  Nothing further is needed.

## 2013-01-20 NOTE — Telephone Encounter (Signed)
Called and spoke with Tammy at [facility]ology She states that the specimen was received late Wed 5/28 and needs to be reviewed by Pathologist Dr Ferne Reus She will ask him to review today and he will call us with report and then will forward to [facility] Will hold in triage to ensure this is done

## 2013-01-20 NOTE — Telephone Encounter (Signed)
Spoke with [facility] [place] [facility] and he wanted vicodin 5/325 mg  Can be called into the pharmacy for the pt.  This has been called to her pharmacy and i called and spoke with pt and she is aware. Nothing further is needed.

## 2013-01-20 NOTE — Telephone Encounter (Signed)
Called and spoke with pt and she stated that she is having pain in her neck and pain behind her left breast.  She stated that it feels like a chest type pressure in her chest.  She stated that she knows she is not having a heart attack, and is not having any swelling.. She is taking in plenty of fluids but she feels that she is not putting out enough of fluids.  She is having a little more of SOB and sleeps with her pillows propped up.  She is on chronic pain meds but she cannot get these filled until June 1 st.  Takes oxycodone 20 mg.  She is not requesting anything very strong just enough to get her through the weekend or until she can be seen.  Maybe something to take to get some extra fluid off too.  KC please advise. She still has not heard anything about the results of the fluid.   Thanks.    No Known Allergies

## 2013-01-20 NOTE — Telephone Encounter (Signed)
I do not see her results in computer under labs.  These should be back already.  Please call pathology at [facility] and see if ready.  If so, let them know not in computer, and have them fax written report to [place] so I can call pt.

## 2013-01-23 ENCOUNTER — Telehealth: Payer: Self-pay | Admitting: Pulmonary Disease

## 2013-01-23 ENCOUNTER — Other Ambulatory Visit: Payer: Self-pay | Admitting: Radiology

## 2013-01-23 NOTE — Telephone Encounter (Signed)
Have already spoken with pt.

## 2013-01-23 NOTE — Telephone Encounter (Signed)
Spoke with pt and her spouse and notified of recs per [facility] She is set to see TP tomorrow am at 11:30

## 2013-01-23 NOTE — Telephone Encounter (Signed)
Spoke with tammy at [facility]. Per Tammy Dr. Ferne Reus has already spoken with Dr. Shelle Iron (on Friday)  Dr. Shelle Iron please advise if patient has been made aware of results. Thank you

## 2013-01-23 NOTE — Telephone Encounter (Signed)
She probably needs to be seen sooner to have cxr and make sure her pleural effusions have not worsened.  See if I have any available spots, and if not, TP or someone else.  Will need cxr same day as apptm.

## 2013-01-23 NOTE — Telephone Encounter (Signed)
I spoke with the pt and she states she feels that she needs a follow-up with Dr. Shelle Iron. She states she is still having a pain in hr chest "that feels like the cancer is squeezing her heart" and some increased SOB. Pt denies any cough at this time. She states she does nto feel like the SOB is "life threatening", but does feel like it is worse then it was while she was inpatient.  I do not see any mention of follow-up. Pt states she is schedule to have biopsy of her lymph node on Thursday and was wanting to f/u with [facility] then. I advised there are no appts available on Thursday.  KC please advise if this pt needs to f/u here? Oncology? PCP? Carron Curie, CMA

## 2013-01-24 ENCOUNTER — Ambulatory Visit (INDEPENDENT_AMBULATORY_CARE_PROVIDER_SITE_OTHER): Payer: BC Managed Care – PPO | Admitting: Adult Health

## 2013-01-24 ENCOUNTER — Encounter: Payer: Self-pay | Admitting: Adult Health

## 2013-01-24 ENCOUNTER — Ambulatory Visit (INDEPENDENT_AMBULATORY_CARE_PROVIDER_SITE_OTHER)
Admission: RE | Admit: 2013-01-24 | Discharge: 2013-01-24 | Disposition: A | Discharge status: Home / Self Care | Payer: BC Managed Care – PPO | Source: Ambulatory Visit | Attending: Adult Health | Admitting: Adult Health

## 2013-01-24 VITALS — BP 114/70 | HR 66 | Temp 98.6°F | Ht 66.0 in | Wt 163.6 lb

## 2013-01-24 DIAGNOSIS — J90 Pleural effusion, not elsewhere classified: Secondary | ICD-10-CM

## 2013-01-24 NOTE — Assessment & Plan Note (Signed)
NO significant increase in effusion size , sats adequate  Advised to continue on O2.  Will cont to follow , once tissue dx made , next step to be determined with PET /Oncology referral .  If increased size in effusion may need to consider [facility] referral for consideration of pleurX vs therapeutic tap.  Case discussed with Dr. Shelle Iron

## 2013-01-24 NOTE — Patient Instructions (Addendum)
Continue on current regimen  Follow up on 01/26/13 for Biopsy .  We will be in touch with results.  Please contact office for sooner follow up if symptoms do not improve or worsen or seek emergency care

## 2013-01-24 NOTE — Progress Notes (Signed)
  Subjective:    Patient ID: Jodi Shepherd, female    DOB: 12/25/1960, 52 y.o.   MRN: [id number]  HPI 52 yo female transferred from [facility] in [place] for bilateral bloody plureral effusions on 01/14/13 to [facility] .  Pulmonary was consulted > CT chest showed large bil. Pleural effusion w/ patchy PNA in lower lobes. With extensive cervical /medistinal adenopathy . Worrisome adrenal nodules .  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  She was set up for OP IR bx for amenable cervical adenopathy on 01/26/13 .  Discharged on o2. At 2l/m  Of note no discharge available for todays visit.   Since discharge she feels more shortness of breath, anxiety and  chest tightness .  CXR today shows Cardiomegaly without pulmonary edema.  Bilateral pleural effusions with loculated component seen in the right lung apex.. Bulky mediastinal and hilar adenopathy. Using oxycodone for chronic pain.    SH:  Lives [place]  Retired Paramedic-disablity for chronic back pain (on chronic pain  meds w/ oxycodone)  Former smoker - quit 11/2012  No etoh use.  No rec drugs  Neg HIV 01/2013  Adult kids-2 , grand kids.      Review of Systems Constitutional:   No  weight loss, night sweats,  Fevers, chills, +fatigue, or  lassitude.  HEENT:   No headaches,  Difficulty swallowing,  Tooth/dental problems, or  Sore throat,                No sneezing, itching, ear ache, nasal congestion, post nasal drip,   CV:  No chest pain,  Orthopnea, PND, swelling in lower extremities, anasarca, dizziness, palpitations, syncope.   GI  No heartburn, indigestion, abdominal pain, nausea, vomiting, diarrhea, change in bowel habits, loss of appetite, bloody stools.   Resp:    No change in color of mucus.  No wheezing.  No chest wall deformity  Skin: no rash or lesions.  GU: no dysuria, change in color of urine, no urgency or frequency.  No flank pain, no hematuria   MS:  No joint pain or swelling.  No decreased  range of motion.  No back pain.  Psych:  No change in mood or affect. No depression or anxiety.  No memory loss.         Objective:   Physical Exam GEN: A/Ox3; pleasant , NAD, well nourished -on O2   HEENT:  [place]/AT,  EACs-clear, TMs-wnl, NOSE-clear, THROAT-clear, no lesions, no postnasal drip or exudate noted.   NECK:  Supple w/ fair ROM; no JVD; normal carotid impulses w/o bruits; no thyromegaly or nodules palpated; +cervical adenopathy.  RESP  Decreased BS in bases, faint crackles in left baseno accessory muscle use, no dullness to percussion  CARD:  RRR, no m/r/g  , no peripheral edema, pulses intact, no cyanosis or clubbing.   GI:   Soft & nt; nml bowel sounds; no organomegaly or masses detected.  Musco: Warm bil, no deformities or joint swelling noted.   Neuro: alert, no focal deficits noted.    Skin: Warm, no lesions or rashes   CXR 01/24/2013 Cardiomegaly without pulmonary edema. Bilateral pleural effusions with loculated component seen in the right lung apex.. Bulky mediastinal and hilar adenopathy.        Assessment & Plan:

## 2013-01-25 LAB — MISCELLANEOUS TEST

## 2013-01-26 ENCOUNTER — Telehealth: Payer: Self-pay | Admitting: *Deleted

## 2013-01-26 ENCOUNTER — Ambulatory Visit (HOSPITAL_COMMUNITY)
Admission: RE | Admit: 2013-01-26 | Discharge: 2013-01-26 | Disposition: A | Discharge status: Home / Self Care | Payer: BC Managed Care – PPO | Source: Ambulatory Visit | Attending: Pulmonary Disease | Admitting: Pulmonary Disease

## 2013-01-26 ENCOUNTER — Encounter (HOSPITAL_COMMUNITY): Payer: Self-pay

## 2013-01-26 ENCOUNTER — Other Ambulatory Visit: Payer: Self-pay | Admitting: Pulmonary Disease

## 2013-01-26 DIAGNOSIS — K219 Gastro-esophageal reflux disease without esophagitis: Secondary | ICD-10-CM | POA: Insufficient documentation

## 2013-01-26 DIAGNOSIS — F3289 Other specified depressive episodes: Secondary | ICD-10-CM | POA: Insufficient documentation

## 2013-01-26 DIAGNOSIS — C801 Malignant (primary) neoplasm, unspecified: Secondary | ICD-10-CM | POA: Insufficient documentation

## 2013-01-26 DIAGNOSIS — J90 Pleural effusion, not elsewhere classified: Secondary | ICD-10-CM | POA: Insufficient documentation

## 2013-01-26 DIAGNOSIS — C770 Secondary and unspecified malignant neoplasm of lymph nodes of head, face and neck: Secondary | ICD-10-CM | POA: Insufficient documentation

## 2013-01-26 DIAGNOSIS — R918 Other nonspecific abnormal finding of lung field: Secondary | ICD-10-CM

## 2013-01-26 DIAGNOSIS — Z87891 Personal history of nicotine dependence: Secondary | ICD-10-CM | POA: Insufficient documentation

## 2013-01-26 DIAGNOSIS — F411 Generalized anxiety disorder: Secondary | ICD-10-CM | POA: Insufficient documentation

## 2013-01-26 DIAGNOSIS — R222 Localized swelling, mass and lump, trunk: Secondary | ICD-10-CM | POA: Insufficient documentation

## 2013-01-26 DIAGNOSIS — I10 Essential (primary) hypertension: Secondary | ICD-10-CM | POA: Insufficient documentation

## 2013-01-26 DIAGNOSIS — E039 Hypothyroidism, unspecified: Secondary | ICD-10-CM | POA: Insufficient documentation

## 2013-01-26 DIAGNOSIS — F329 Major depressive disorder, single episode, unspecified: Secondary | ICD-10-CM | POA: Insufficient documentation

## 2013-01-26 NOTE — Telephone Encounter (Signed)
Called pt left VM message regarding appt 02/02/13 at 2:00 arrive at 1:45.  Left my phone number to call with concerns or questions

## 2013-01-26 NOTE — Progress Notes (Signed)
Discharged home with spouse via w/c

## 2013-01-26 NOTE — Procedures (Signed)
Ultrasound guided left cervical lymph node biopsy.  4 cores obtained and placed in saline.  No immediate complication.

## 2013-01-26 NOTE — H&P (Signed)
Jodi Shepherd is an 52 y.o. female.   Chief Complaint: Left cervical lymphnode enlargement Bilateral pleural effusions Bilat pulmonary ground glass opacities: worse on left Scheduled for left neck LAN biopsy HPI: HTN; hypothyroid; anxiety; smoker  Past Medical History  Diagnosis Date  . Hypertension   . Hypothyroidism   . Anxiety   . Depression   . GERD (gastroesophageal reflux disease)     Past Surgical History  Procedure Laterality Date  . Abdominal hysterectomy    . Inner ear surgery      Family History  Problem Relation Age of Onset  . Diabetes Mellitus II Father    Social History:  reports that she quit smoking about 3 weeks ago. Her smoking use included Cigarettes. She has a 20 pack-year smoking history. She has never used smokeless tobacco. She reports that she does not drink alcohol or use illicit drugs.  Allergies: No Known Allergies   (Not in a hospital admission)  No results found for this or any previous visit (from the past 48 hour(s)). No results found.  Review of Systems  Constitutional: Negative for fever.  HENT: Positive for neck pain.   Respiratory: Negative for shortness of breath.   Cardiovascular: Negative for chest pain.  Gastrointestinal: Negative for nausea, vomiting and abdominal pain.  Neurological: Negative for weakness.    There were no vitals taken for this visit. Physical Exam  Constitutional: She is oriented to person, place, and time.  Neck: Normal range of motion.  Left cervical LAN  Cardiovascular: Normal rate, regular rhythm and normal heart sounds.   No murmur heard. Respiratory: Effort normal. She has wheezes.  GI: Soft. Bowel sounds are normal. There is no tenderness.  Musculoskeletal: Normal range of motion.  Lymphadenopathy:    She has cervical adenopathy.  Neurological: She is alert and oriented to person, place, and time.  Psychiatric: She has a normal mood and affect. Her behavior is normal. Judgment and thought  content normal.     Assessment/Plan Cervical LAN; B pleural effusions prob lung Ca Left neck lymph node biopsy Pt aware of procedure benefits and risks and agreeable to proceed Consent signed and in chart  Thurmon Mizell A 01/26/2013, 2:42 PM

## 2013-01-27 ENCOUNTER — Telehealth (HOSPITAL_COMMUNITY): Payer: Self-pay | Admitting: *Deleted

## 2013-01-30 ENCOUNTER — Telehealth: Payer: Self-pay | Admitting: Pulmonary Disease

## 2013-01-30 NOTE — Telephone Encounter (Signed)
Pt is aware of KC recommendations. She states that she is already taking Ibuprofen with Oxycodone with no relief. I advised that she go to the ER and she declined. I apologized for the inconvience but we could not increase her medication. She agreed and verbalized understanding. Nothing further was needed.

## 2013-01-30 NOTE — Telephone Encounter (Signed)
Called, spoke with pt.  States since the bx on Thursday, her pain is getting more intense.  States today she is in "pretty bad pain."  Pain is constant and is from her left lymph node into her shoulder, heart and shoulder blade.  No sweating, n/v, fever, or drainage from bx area.  She is unable to sleep d/t pain, has increased SOB, and can only lay in 1 position.  She is taking oxycodone 20 mg 5x daily.  States this used to work but is no longer helping.  Also using ice pack with no relief.  Her grandaugher is graduating on Saturday.  Pt would really like to get to feeling better to be able to attend the ceremony.  She is requesting any further recs KC may have.  She became tearful at the end of the conversation.  Dr. Shelle Iron, pls advise.  Thank you.  nkda- verified with pt  [facility] id [place], [place]

## 2013-01-30 NOTE — Telephone Encounter (Signed)
I am not willing to increase her pain med further.  I am concerned about over medicating her.  I suspect is not coming from lymph node but the cancer in her chest.  She can try adding ibuprofen 600mg  every 8 hrs to the oxycodone to see if it will help (it works very well with the narcotic).  If she feels this is not going to help her, or if it doesn't help her, then she may need to come to ER to possibly be admitted.  Then she can be given more pain med in a monitored situation.  I still have not seen the results of her biopsy as of yet.

## 2013-01-31 ENCOUNTER — Telehealth: Payer: Self-pay | Admitting: *Deleted

## 2013-01-31 NOTE — Telephone Encounter (Signed)
Spoke with pt regarding appt for [facility] 02/02/13 at 3:00 arrive at 2:45.  She verbalized understanding of time and place of appt

## 2013-02-01 NOTE — Discharge Summary (Signed)
Physician Discharge Summary  Jodi Shepherd ZOX:[id number] DOB: 08/03/1961 DOA: 01/14/2013  PCP: No PCP Per Patient  Admit date: 01/14/2013 Discharge date: 02/01/2013  Time spent: 45 minutes Discharge Diagnoses:  Principal Problem:   Pleural effusion, bilateral Active Problems:   HTN (hypertension)   Depression   Hypothyroidism   Tobacco abuse   Lung cancer   Discharge Condition: stable  Diet recommendation: heart healthy  Filed Weights   01/14/13 0236  Weight: 167 lb 8.8 oz (76 kg)    History of present illness:  Jodi Shepherd is a 52 y.o. female with history of hypertension, hypothyroidism and depression was admitted at [facility] in [place] for shortness of breath and was found to have bilateral pleural effusion loculated with bilateral airspace disease. Patient was started on empiric antibiotics and had CT chest the results of which are not available and eventually patient underwent right-sided thoracentesis followed by left-sided thoracentesis each day and patient has been transferred to [facility] for further management. As per the report received patient had bloody pleural effusion. Patient states that she has been having shortness of breath for some time over the last few weeks but acutely worsened since May 13. Patient also has been having pleuritic type of chest pain. She went to the hospital 3 days ago. Patient denies any productive cough or any fever chills. Has noticed 30 pounds weight loss in last 3 months. Denies any nausea vomiting diarrhea. Has not had any joint swellings. Denies any insect bite or any foreign travel. Had no contact with people with TB.      Hospital Course:  Pleural effusion, bilateral/ lymphadenopathy/ lung mass with mets  - pt understands that diagnosis is likely cancer and would like to go home from the hospital and have outpt work up and treatment- not very keen on getting chemo but amenable to radiation if needed.  - await cytology from  pleural fluid  -understands that pleural fluid may re-collect rapidly  - titrate down O2- discussed need for home O2 with case manager  - oupt f/u with Pulmonary   Active Problems:  HTN (hypertension)  -resume Atenolol   Hypothyroidism  -resume synthroid   Tobacco abuse   Consultations:  pulmonary  Discharge Exam: Filed Vitals:   01/15/13 1200 01/15/13 1210 01/15/13 1452 01/15/13 1635  BP: 119/64   129/75  Pulse: 63   85  Temp:  98.3 F (36.8 C)    TempSrc:  Oral    Resp: 18   18  Height:      Weight:      SpO2: 92%  97% 93%    General: AAO x 3, no distress Cardiovascular: RRR, no murmurs  Respiratory: CTA b/l   Discharge Instructions  Discharge Orders   Future Appointments Provider Department Dept Phone   02/02/2013 10:00 AM [facility] [phone]   Pt should arrive15 minutes prior to scheduled appt time. Please inform patient that exam will take a minimum of 1 1/2 hours. Patient to be NPO 6 hours prior to exam  and should not take any insulin the day of exam.   02/02/2013 3:00 PM Si Gaul, MD [facility] MEDICAL ONCOLOGY [phone]   Future Orders Complete By Expires     Diet - low sodium heart healthy  As directed     Discharge instructions  As directed     Comments:      Follow up with pulmonary for fluid cytology results Follow up  with Enid Cutter, PA- this week.    For home use only DME oxygen  As directed     Comments:      Pt's O2 sat 87% at rest while on room air. With ambulation, pt's sat 83% on RA, 86% on 1L [place] O2, and 92% on 2L [place] O2. Pt's sat 93% at rest on 2L [place].  Length of need 99 months  [id number]  NPI number    Questions:      Mode or (Route):  Nasal cannula    Liters per Minute:  2    Frequency:  Continuous    Oxygen conserving device:  Yes    For home use only DME oxygen  As directed     Questions:      Mode or (Route):  Nasal cannula    Liters per Minute:  2     Frequency:  Continuous    Oxygen conserving device:  Yes    Increase activity slowly  As directed         Medication List    TAKE these medications       ALPRAZolam 0.25 MG tablet  Commonly known as:  XANAX  Take 1 tablet (0.25 mg total) by mouth 3 (three) times daily as needed for anxiety.     atenolol 25 MG tablet  Commonly known as:  TENORMIN  Take 75 mg by mouth 2 (two) times daily.     carisoprodol 350 MG tablet  Commonly known as:  SOMA  Take 350 mg by mouth 4 (four) times daily as needed.     estradiol 2 MG tablet  Commonly known as:  ESTRACE  Take 2 mg by mouth daily.     levothyroxine 50 MCG tablet  Commonly known as:  SYNTHROID, LEVOTHROID  Take 50 mcg by mouth daily before breakfast.     lisinopril 30 MG tablet  Commonly known as:  PRINIVIL,ZESTRIL  Take 30 mg by mouth daily.     oxybutynin 10 MG 24 hr tablet  Commonly known as:  DITROPAN-XL  Take 10 mg by mouth at bedtime.     simvastatin 40 MG tablet  Commonly known as:  ZOCOR  Take 40 mg by mouth every evening.       No Known Allergies     Follow-up Information   Follow up with [facility]. (they will call you- )    Contact information:   [address] [place] [address]        The results of significant diagnostics from this hospitalization (including imaging, microbiology, ancillary and laboratory) are listed below for reference.    Significant Diagnostic Studies: Dg Chest 2 View  01/24/2013   *RADIOLOGY REPORT*  Clinical Data: Follow-up pleural effusion.  Shortness of breath, cough, hypertension.  History of smoking, lung cancer.  CHEST - 2 VIEW  Comparison: 01/15/2013 chest x-ray and chest CT 01/14/2013  Findings: There are bilateral pleural effusions, similar in quantity compared to prior study.  Probable loculated component is identified laterally at the right lung apex.  Bulky mediastinal and hilar adenopathy are noted, as seen on prior chest CT.  There is  patchy density at the lung bases, consistent with atelectasis / consolidation.  The heart is enlarged.  No pulmonary edema.  IMPRESSION:  1.  Cardiomegaly without pulmonary edema. 2.  Bilateral pleural effusions with loculated component seen in the right lung apex. 3.  Bulky mediastinal and hilar adenopathy.   Original Report Authenticated By: Lanora Manis  Manson Passey, M.D.   Ct Chest W Contrast  01/14/2013   *RADIOLOGY REPORT*  Clinical Data: Malignancy suspected.  Abnormal radiograph.  CT CHEST WITH CONTRAST  Technique:  Multidetector CT imaging of the chest was performed following the standard protocol during bolus administration of intravenous contrast.  Contrast: 80mL OMNIPAQUE IOHEXOL 300 MG/ML  SOLN  Comparison: None.  Findings: Abnormal cervical and mediastinal adenopathy is present. 2.0 cm right level IV cervical node.  1.8 cm left level IV cervical node.  Several mediastinal nodes at the thoracic inlet.  2.9 cm right nodal mass short axis diameter on image 19.  1.7 cm prevascular node on image 20.  2.6 cm prevascular node on image 24. 3.8 cm subcarinal lymph node mass.  2.4 cm left hilar lymph node. 1.9 cm precarinal lymph node.  1.2 cm right hilar node.  10 mm right pericardiophrenic angle node. Bilateral and large supraclavicular nodes.  Abnormal adenopathy about the celiac axis in the upper abdomen is suspected.  Bilateral small adrenal nodules are worrisome for metastatic disease.  Bilateral pleural effusions are present.  They extend anteriorly suggesting complicated pleural effusion that one would have with malignancy.  There is a small loculated retrosternal fluid collection on image 22 extending inferiorly of unknown significance.  This may represent a segment of the loculated pleural effusion.  Advanced centrilobular emphysema.  There is volume loss at both lung bases associated with heterogeneous pulmonary opacities. There is no dominant lung mass.  The left hilar mass designated  as a lymph node may  actually represent the primary lung mass.  No destructive bone lesion.  Ground-glass opacity and interlobular septal thickening in both upper lobes anteriorly is present worse on the left.  Lymphangitic spread of tumor cannot be excluded.  IMPRESSION: The above findings are compatible with advanced thoracic malignancy characterized by mediastinal adenopathy, neck adenopathy, abdominal metastatic disease, pulmonary disease, and pleural disease.  Neck adenopathy is amenable to percutaneous biopsy.   Original Report Authenticated By: Jolaine Click, M.D.   US Biopsy  01/26/2013   *RADIOLOGY REPORT*  Clinical history:52 year old with extensive is neck and thoracic lymphadenopathy.  No malignant cells identified on pleural fluid cytology.  Definitive diagnosis is needed.  PROCEDURE(S): ULTRASOUND GUIDED CORE BIOPSIES OF A LEFT SUPRACLAVICULAR LYMPH NODE  Physician: Rachelle Hora. Henn, MD  Medications:None  Moderate sedation time:None  Fluoroscopy time: None  Procedure:The procedure was explained to the patient.  The risks and benefits of the procedure were discussed and the patient's questions were addressed.  Informed consent was obtained from the patient.  The neck was evaluated with ultrasound.  Multiple large lymph nodes were identified on both sides of the neck.  A left supraclavicular lymph node was targeted for biopsy.  The left side of the neck was prepped and draped in a sterile fashion.  The skin was anesthetized with 1% lidocaine.  Using ultrasound guidance, four core biopsies were obtained with an 18 gauge core device. Samples placed in saline.  Findings:Multiple enlarged hypoechoic lymph nodes on both sides of the neck.  Needle position confirmed within the left supraclavicular lymph node.  Complications: None  Impression:Ultrasound guided core biopsies of a left supraclavicular lymph node.   Original Report Authenticated By: Richarda Overlie, M.D.   Dg Chest Port 1 View  01/15/2013   *RADIOLOGY REPORT*  Clinical Data:  Follow-up effusions  PORTABLE CHEST - 1 VIEW  Comparison: 01/14/2013  Findings: Artifact overlies the chest.  Bilateral pleural effusions persist, the same more minimally larger.  There  is associated atelectasis in the lower lungs.  Vascularity shows venous hypertension possibly with early interstitial edema.  The aorta is unfolded.  There is mediastinal widening.  IMPRESSION: Persistent bilateral effusions, possibly minimally larger.  Lower lobe atelectasis and/or infiltrate.  Mediastinal widening.   Original Report Authenticated By: Paulina Fusi, M.D.   Dg Chest Port 1 View  01/14/2013   *RADIOLOGY REPORT*  Clinical Data: Bilateral pleural effusions.  PORTABLE CHEST - 1 VIEW 01/14/2013 0609 hours:  Comparison: None.  Findings: Cardiac silhouette upper normal in size for AP portable technique. Thoracic aorta tortuous and atherosclerotic.  Right paratracheal opacity which may represent an ectatic ascending thoracic aorta.  Mild pulmonary venous hypertension without overt edema.  Large bilateral pleural effusions and associated consolidation in the lower lobes.  Patchy airspace opacities in the lingula and right middle lobe.  Biapical pleuroparenchymal scarring.  IMPRESSION:  1.  Large bilateral pleural effusions with associated atelectasis and/or pneumonia in the lower lobes.  Patchy pneumonia is suspected in the lingula and right middle lobe. 2.  Right paratracheal opacity which may represent an ectatic ascending thoracic aorta.   Original Report Authenticated By: Hulan Saas, M.D.    Microbiology: No results found for this or any previous visit (from the past 240 hour(s)).   Labs: Basic Metabolic Panel: No results found for this basename: NA, K, CL, CO2, GLUCOSE, BUN, CREATININE, CALCIUM, MG, PHOS,  in the last 168 hours Liver Function Tests: No results found for this basename: AST, ALT, ALKPHOS, BILITOT, PROT, ALBUMIN,  in the last 168 hours No results found for this basename: LIPASE, AMYLASE,   in the last 168 hours No results found for this basename: AMMONIA,  in the last 168 hours CBC: No results found for this basename: WBC, NEUTROABS, HGB, HCT, MCV, PLT,  in the last 168 hours Cardiac Enzymes: No results found for this basename: CKTOTAL, CKMB, CKMBINDEX, TROPONINI,  in the last 168 hours BNP: BNP (last 3 results) No results found for this basename: PROBNP,  in the last 8760 hours CBG: No results found for this basename: GLUCAP,  in the last 168 hours     Signed:  Darrek Leasure  [facility] 02/01/2013, 3:24 PM

## 2013-02-02 ENCOUNTER — Ambulatory Visit (HOSPITAL_BASED_OUTPATIENT_CLINIC_OR_DEPARTMENT_OTHER): Payer: BC Managed Care – PPO | Admitting: Internal Medicine

## 2013-02-02 ENCOUNTER — Encounter: Payer: Self-pay | Admitting: *Deleted

## 2013-02-02 ENCOUNTER — Other Ambulatory Visit: Payer: Self-pay | Admitting: Pulmonary Disease

## 2013-02-02 ENCOUNTER — Encounter (HOSPITAL_COMMUNITY): Payer: Self-pay

## 2013-02-02 ENCOUNTER — Telehealth: Payer: Self-pay | Admitting: Internal Medicine

## 2013-02-02 ENCOUNTER — Ambulatory Visit (INDEPENDENT_AMBULATORY_CARE_PROVIDER_SITE_OTHER): Payer: BC Managed Care – PPO | Admitting: Cardiothoracic Surgery

## 2013-02-02 ENCOUNTER — Ambulatory Visit (HOSPITAL_COMMUNITY)
Admission: RE | Admit: 2013-02-02 | Discharge: 2013-02-02 | Disposition: A | Discharge status: Home / Self Care | Payer: BC Managed Care – PPO | Source: Ambulatory Visit | Attending: Pulmonary Disease | Admitting: Pulmonary Disease

## 2013-02-02 ENCOUNTER — Telehealth: Payer: Self-pay | Admitting: Pulmonary Disease

## 2013-02-02 VITALS — BP 126/67 | HR 74 | Temp 98.4°F | Resp 18 | Ht 66.0 in | Wt 186.7 lb

## 2013-02-02 DIAGNOSIS — J910 Malignant pleural effusion: Secondary | ICD-10-CM

## 2013-02-02 DIAGNOSIS — M549 Dorsalgia, unspecified: Secondary | ICD-10-CM

## 2013-02-02 DIAGNOSIS — C3490 Malignant neoplasm of unspecified part of unspecified bronchus or lung: Secondary | ICD-10-CM | POA: Insufficient documentation

## 2013-02-02 DIAGNOSIS — C778 Secondary and unspecified malignant neoplasm of lymph nodes of multiple regions: Secondary | ICD-10-CM

## 2013-02-02 DIAGNOSIS — C50919 Malignant neoplasm of unspecified site of unspecified female breast: Secondary | ICD-10-CM | POA: Insufficient documentation

## 2013-02-02 DIAGNOSIS — C779 Secondary and unspecified malignant neoplasm of lymph node, unspecified: Secondary | ICD-10-CM | POA: Insufficient documentation

## 2013-02-02 DIAGNOSIS — C3491 Malignant neoplasm of unspecified part of right bronchus or lung: Secondary | ICD-10-CM

## 2013-02-02 DIAGNOSIS — Z90710 Acquired absence of both cervix and uterus: Secondary | ICD-10-CM | POA: Insufficient documentation

## 2013-02-02 MED ORDER — GADOBENATE DIMEGLUMINE 529 MG/ML IV SOLN
15.0000 mL | Freq: Once | INTRAVENOUS | Status: AC | PRN
  Administered 2013-02-02: 15 mL via INTRAVENOUS

## 2013-02-02 MED ORDER — FLUDEOXYGLUCOSE F - 18 (FDG) INJECTION
19.0000 | Freq: Once | INTRAVENOUS | Status: AC | PRN
  Administered 2013-02-02: 19 via INTRAVENOUS

## 2013-02-02 MED ORDER — PROCHLORPERAZINE MALEATE 10 MG PO TABS: 10.0000 mg | ORAL_TABLET | Freq: Four times a day (QID) | ORAL | Status: DC | PRN

## 2013-02-02 MED ORDER — LIDOCAINE-PRILOCAINE 2.5-2.5 % EX CREA: TOPICAL_CREAM | CUTANEOUS | Status: DC | PRN

## 2013-02-02 NOTE — Telephone Encounter (Signed)
I spoke with Jodi Shepherd. He did want to order MRI of the head not MRA. It was just ordered wrong. I called and made aaron aware. He stated he will change the order in EPIC. Nothing further was needed

## 2013-02-02 NOTE — Progress Notes (Signed)
   Thoracic Treatment Summary Name: Azyriah Nevins Date: 02/02/13 DOB: 11/04/2060 Your Medical Team Medical Oncologist: Dr. Arbutus Ped Radiation Oncologist: Pulmonologist: Surgeon: Dr. Maren Beach Type and Stage of Lung Cancer Non-Small Cell Carcinoma:  Squamous Cell Clinical Stage:  IV   Clinical stage is based on radiology exams.  Pathological stage will be determined after surgery.  Staging is based on the size of the tumor, involvement of lymph nodes or not, and whether or not the cancer center has spread. Recommendations Recommendations: Chemotherapy, Chemo Class, Port placement  These recommendations are based on information available as of today's consult.  This is subject to change depending further testing or exams. Next Steps Next Step: 1. Dr. Despina Hidden office will with appointment for port cath [phone]) 2. Scheduling for chemo and chemo class Barriers to Care What do you perceive as a potential barrier that may prevent you from receiving your treatment plan? Financial difficulties.  Set up to see Financial Ad.  Information on lung cancer booklet from [employer] given.  Resource information given and explained for [facility] Questions Willette Pa, RN BSN Thoracic Oncology Nurse Navigator at [phone]  Annabelle Harman is a nurse navigator that is available to assist you through your cancer journey.  She can answer your questions and/or provide resources regarding your treatment plan, emotional support, or financial concerns.

## 2013-02-02 NOTE — Telephone Encounter (Signed)
gv adn printed appt sched...emailed MW to add tx...pt ok and aware

## 2013-02-02 NOTE — Progress Notes (Addendum)
[facility] Clinical Social Work  Clinical Social Work met with patient at [employer] today.  CSW briefly discussed support services and CSW role at [facility].  CSW encouraged patient to call with any questions or concerns. Patient scored a 6 on the DT. Patient reports history of anxiety and depression and marked her biggest causes of distress were pain and fear.  CSW encouraged patient to utilize counseling services at [facility] to cope with recent diagnosis.  Kathrin Penner, MSW, LCSW Clinical Social Worker [facility] [phone]

## 2013-02-03 ENCOUNTER — Other Ambulatory Visit: Payer: Self-pay | Admitting: *Deleted

## 2013-02-03 ENCOUNTER — Encounter: Payer: Self-pay | Admitting: Internal Medicine

## 2013-02-03 ENCOUNTER — Encounter: Payer: Self-pay | Admitting: *Deleted

## 2013-02-03 ENCOUNTER — Telehealth: Payer: Self-pay | Admitting: *Deleted

## 2013-02-03 DIAGNOSIS — C801 Malignant (primary) neoplasm, unspecified: Secondary | ICD-10-CM

## 2013-02-03 NOTE — Telephone Encounter (Signed)
Per staff message and POF I have scheduled appts. Sent scheduler message to move lab on 7/3. JMW

## 2013-02-03 NOTE — Progress Notes (Signed)
Gas card application completed 

## 2013-02-03 NOTE — Patient Instructions (Signed)
You are recently diagnosed with a stage IV non-small cell lung cancer, squamous cell carcinoma. We discussed treatment options including systemic chemotherapy with carboplatin and Abraxane. The first cycle he scheduled next week. Followup visit in 2 weeks

## 2013-02-03 NOTE — Progress Notes (Signed)
[facility] [phone]   [phone] Multidisciplinary Thoracic Oncology Clinic [facility])  CONSULT NOTE  REFERRING PHYSICIAN: Dr. Marcelyn Bruins  REASON FOR CONSULTATION:  52 years old white female recently diagnosed with lung cancer.  HPI Jodi Shepherd is a 52 y.o. female who currently lives in [place], [place] after moving from [place], [place]. The patient has a past medical history significant for hypertension, hypothyroidism, depression, anxiety, dyslipidemia as well as chronic back pain and long history of smoking but quit 6 weeks ago. The patient mentions that she has been complaining of increasing shortness breath over the last couple of months and she did not pay it much attention since she was moving to [place]. It was getting worse. She was admitted to [facility] in [place]. Chest x-ray performed at that time showed bilateral pleural effusion. The patient was treated with antibiotics and was transferred to [facility] for further evaluation. CT scan of the chest on 01/14/2013 showed abnormal cervical and mediastinal adenopathy is present. 2.0 cm right level IV cervical node. 1.8 cm left level IV cervical node. Several mediastinal nodes at the thoracic inlet. 2.9 cm  right nodal mass short axis diameter on image 19. 1.7 cm prevascular node on image 20. 2.6 cm prevascular node. 3.8 cm subcarinal lymph node mass. 2.4 cm left hilar lymph node. 1.9 cm precarinal lymph node. 1.2 cm right hilar node. 10 mm  right pericardiophrenic angle node. Bilateral and large supraclavicular nodes.Abnormal adenopathy about the celiac axis in the upper abdomen is  suspected. Bilateral small adrenal nodules are worrisome for metastatic disease.  Bilateral pleural effusions are present. They extend anteriorly suggesting complicated pleural effusion that one would have with malignancy. Ultrasound right sided thoracentesis was performed and the final  cytology showed malignant cells. On 01/26/2013 the patient underwent ultrasound-guided core biopsies of a left supraclavicular lymph node by interventional radiology and the final pathology (Accession: [id number]) showed metastatic carcinoma. The malignant cells are positive for cytokeratin 7 and cytokeratin AE1/AE3 with faint staining for cytokeratin 5/6. They are negative for TTF-1, cytokeratin 20, S-100, and estrogen receptor. 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 Earlier today the patient had MRI of the brain that showed no acute or metastatic intracranial abnormality.  A PET scan on 02/02/2013 showed extensive metastatic disease throughout the thorax and upper abdomen. An exact primary lesion in the lung is not  confidently identified on today's examination, although any one of these pleural based hypermetabolic nodules can certainly be a primary lung lesion, this is not strongly favored. The overall appearance of today's study favors a diagnosis of small cell carcinoma, however, this is a discordant with the pathology results  from recent supraclavicular nodal biopsy. Potential small right adrenal metastasis also noted. Bilateral malignant pleural effusions. The patient was seen by Dr. Shelle Iron and he kindly referred her to me today for further evaluation and recommendation regarding treatment of her condition. For the back pain she is currently followed by a pain clinic. When seen today the patient continues to complain of central chest pain as well as shortness breath and she is currently on home oxygen 2 L per minute. The patient also has cough productive of a foamy sputum. She has occasional nausea. She lost around 32 pounds in the last 6 months. The patient is married and has 3 children. She was accompanied today by her husband Jodi Shepherd.  She used to work as a Radiation protection practitioner. She  has a history of smoking one pack per day for over 30 years and quit 6 weeks ago. She has no history of alcohol or drug abuse.  @SFHPI @  Past Medical History  Diagnosis Date  . Hypertension   . Hypothyroidism   . Anxiety   . Depression   . GERD (gastroesophageal reflux disease)     Past Surgical History  Procedure Laterality Date  . Abdominal hysterectomy    . Inner ear surgery      Family History  Problem Relation Age of Onset  . Diabetes Mellitus II Father     Social History History  Substance Use Topics  . Smoking status: Former Smoker -- 1.00 packs/day for 20 years    Types: Cigarettes    Quit date: 12/31/2012  . Smokeless tobacco: Never Used  . Alcohol Use: No    No Known Allergies  Current Outpatient Prescriptions  Medication Sig Dispense Refill  . ALPRAZolam (XANAX) 0.25 MG tablet Take 1 tablet (0.25 mg total) by mouth 3 (three) times daily as needed for anxiety.  30 tablet  0  . atenolol (TENORMIN) 25 MG tablet Take 75 mg by mouth 2 (two) times daily.      . Calcium-Magnesium-Vitamin D (CALCIUM 1200+D3) 600-40-500 MG-MG-UNIT TB24 Take 1 tablet by mouth daily.      . carisoprodol (SOMA) 350 MG tablet Take 350 mg by mouth 4 (four) times daily as needed.       Marland Kitchen co-enzyme Q-10 30 MG capsule Take 100 mg by mouth daily.      Marland Kitchen esomeprazole (NEXIUM) 40 MG capsule Take 40 mg by mouth daily before breakfast.      . estradiol (ESTRACE) 2 MG tablet Take 2 mg by mouth daily.      . fexofenadine (ALLEGRA) 180 MG tablet Take 180 mg by mouth daily.      Marland Kitchen FLUoxetine (PROZAC) 20 MG capsule Take 1 capsule by mouth daily.      . fluticasone (FLONASE) 50 MCG/ACT nasal spray Place 2 sprays into the nose 2 (two) times daily.      Marland Kitchen lamoTRIgine (LAMICTAL) 100 MG tablet Take 1 tablet by mouth 2 (two) times daily.      Marland Kitchen levothyroxine (SYNTHROID, LEVOTHROID) 50 MCG tablet Take 50 mcg by mouth daily before breakfast.      . lisinopril (PRINIVIL,ZESTRIL) 30 MG tablet Take 30 mg by  mouth daily.      . Multiple Vitamin (MULTIVITAMIN) tablet Take 1 tablet by mouth daily.      Marland Kitchen oxybutynin (DITROPAN-XL) 10 MG 24 hr tablet Take 10 mg by mouth at bedtime.      Marland Kitchen oxyCODONE (OXYCONTIN) 20 MG 12 hr tablet 1/2 tab by mouth every 2 hours as needed      . PROAIR HFA 108 (90 BASE) MCG/ACT inhaler 2 puffs every 4-6 hours as needed      . simvastatin (ZOCOR) 40 MG tablet Take 40 mg by mouth every evening.      . lidocaine-prilocaine (EMLA) cream Apply topically as needed.  30 g  0  . prochlorperazine (COMPAZINE) 10 MG tablet Take 1 tablet (10 mg total) by mouth every 6 (six) hours as needed.  60 tablet  0   No current facility-administered medications for this visit.    Review of Systems  A comprehensive review of systems was negative except for: Constitutional: positive for fatigue and weight loss Respiratory: positive for cough, dyspnea on exertion,  pleurisy/chest pain, sputum and wheezing Gastrointestinal: positive for nausea Musculoskeletal: positive for back pain Neurological: positive for headaches  Physical Exam  ZOX:WRUEA, healthy, no distress, well nourished and well developed SKIN: skin color, texture, turgor are normal HEAD: Normocephalic, No masses, lesions, tenderness or abnormalities EYES: normal, PERRLA EARS: External ears normal OROPHARYNX:no exudate and no erythema  NECK: supple, significant lymphadenopathy bilaterally and the right and left supraclavicular as well as low cervical lymph nodes. LYMPH:  significant lymphadenopathy bilaterally and the right and left supraclavicular as well as low cervical lymph nodes. BREAST:not examined LUNGS: expiratory wheezes bilaterally HEART: regular rate & rhythm and no murmurs ABDOMEN:abdomen soft, non-tender, obese, normal bowel sounds and no masses or organomegaly BACK: Back symmetric, no curvature. EXTREMITIES:no joint deformities, effusion, or inflammation, no edema, no skin discoloration  NEURO: alert & oriented  x 3 with fluent speech, no focal motor/sensory deficits  PERFORMANCE STATUS: ECOG 1  LABORATORY DATA: Lab Results  Component Value Date   WBC 14.7* 01/14/2013   HGB 12.9 01/14/2013   HCT 38.2 01/14/2013   MCV 85.5 01/14/2013   PLT 290 01/14/2013      Chemistry      Component Value Date/Time   NA 133* 01/14/2013 0530   K 4.0 01/14/2013 0530   CL 95* 01/14/2013 0530   CO2 25 01/14/2013 0530   BUN 8 01/14/2013 0530   CREATININE 0.34* 01/14/2013 0530      Component Value Date/Time   CALCIUM 8.7 01/14/2013 0530   ALKPHOS 100 01/14/2013 0530   AST 25 01/14/2013 0530   ALT 48* 01/14/2013 0530   BILITOT 0.2* 01/14/2013 0530       RADIOGRAPHIC STUDIES: Dg Chest 2 View  01/24/2013   *RADIOLOGY REPORT*  Clinical Data: Follow-up pleural effusion.  Shortness of breath, cough, hypertension.  History of smoking, lung cancer.  CHEST - 2 VIEW  Comparison: 01/15/2013 chest x-ray and chest CT 01/14/2013  Findings: There are bilateral pleural effusions, similar in quantity compared to prior study.  Probable loculated component is identified laterally at the right lung apex.  Bulky mediastinal and hilar adenopathy are noted, as seen on prior chest CT.  There is patchy density at the lung bases, consistent with atelectasis / consolidation.  The heart is enlarged.  No pulmonary edema.  IMPRESSION:  1.  Cardiomegaly without pulmonary edema. 2.  Bilateral pleural effusions with loculated component seen in the right lung apex. 3.  Bulky mediastinal and hilar adenopathy.   Original Report Authenticated By: Norva Pavlov, M.D.   Ct Chest W Contrast  01/14/2013   *RADIOLOGY REPORT*  Clinical Data: Malignancy suspected.  Abnormal radiograph.  CT CHEST WITH CONTRAST  Technique:  Multidetector CT imaging of the chest was performed following the standard protocol during bolus administration of intravenous contrast.  Contrast: 80mL OMNIPAQUE IOHEXOL 300 MG/ML  SOLN  Comparison: None.  Findings: Abnormal cervical and  mediastinal adenopathy is present. 2.0 cm right level IV cervical node.  1.8 cm left level IV cervical node.  Several mediastinal nodes at the thoracic inlet.  2.9 cm right nodal mass short axis diameter on image 19.  1.7 cm prevascular node on image 20.  2.6 cm prevascular node on image 24. 3.8 cm subcarinal lymph node mass.  2.4 cm left hilar lymph node. 1.9 cm precarinal lymph node.  1.2 cm right hilar node.  10 mm right pericardiophrenic angle node. Bilateral and large supraclavicular nodes.  Abnormal adenopathy about the celiac axis in the upper abdomen is suspected.  Bilateral small adrenal nodules are worrisome for metastatic disease.  Bilateral pleural effusions are present.  They extend anteriorly suggesting complicated pleural effusion that one would have with malignancy.  There is a small loculated retrosternal fluid collection on image 22 extending inferiorly of unknown significance.  This may represent a segment of the loculated pleural effusion.  Advanced centrilobular emphysema.  There is volume loss at both lung bases associated with heterogeneous pulmonary opacities. There is no dominant lung mass.  The left hilar mass designated  as a lymph node may actually represent the primary lung mass.  No destructive bone lesion.  Ground-glass opacity and interlobular septal thickening in both upper lobes anteriorly is present worse on the left.  Lymphangitic spread of tumor cannot be excluded.  IMPRESSION: The above findings are compatible with advanced thoracic malignancy characterized by mediastinal adenopathy, neck adenopathy, abdominal metastatic disease, pulmonary disease, and pleural disease.  Neck adenopathy is amenable to percutaneous biopsy.   Original Report Authenticated By: Jolaine Click, M.D.   Mr Laqueta Jean Wo Contrast  02/02/2013   *RADIOLOGY REPORT*  Clinical Data: 52 year old female with advanced thoracic malignancy, metastatic carcinoma most suggestive of non-small cell carcinoma on the left  supraclavicular lymph node biopsy. Staging.  MRI HEAD WITHOUT AND WITH CONTRAST  Technique:  Multiplanar, multiecho pulse sequences of the brain and surrounding structures were obtained according to standard protocol without and with intravenous contrast  Contrast: 15mL MULTIHANCE GADOBENATE DIMEGLUMINE 529 MG/ML IV SOLN  Comparison: Chest CT 01/14/2013.  Findings: No abnormal enhancement identified.  No midline shift, mass effect, or evidence of mass lesion.  No abnormal diffusion signal. No restricted diffusion to suggest acute infarction.  No acute intracranial hemorrhage identified. Normal cerebral volume.  No ventriculomegaly.  Negative pituitary, cervicomedullary junction and visualized cervical spine.  Skull and visualized upper cervical spine bone marrow signal is within normal limits.  No dural thickening identified.  Small chronic lacunar infarct right caudate nucleus.  Otherwise minimal cerebral white matter T2 and FLAIR hyperintensity.  Deep gray matter nuclei, brainstem and cerebellum within normal limits. Major intracranial vascular flow voids are preserved.  Mastoids are clear.  Grossly normal visualized internal auditory structures.  Paranasal sinuses are clear. Visualized orbit soft tissues are within normal limits.  Negative scalp soft tissues. Previously seen lower cervical lymph node enlargement not included on these images.  IMPRESSION: 1. No acute or metastatic intracranial abnormality. 2.  Minimal to mild chronic small vessel ischemia.   Original Report Authenticated By: Erskine Speed, M.D.   Nm Pet Image Initial (pi) Skull Base To Thigh  02/02/2013   *RADIOLOGY REPORT*  Clinical Data: Initial treatment strategy for lung cancer. Staging scan.  NUCLEAR MEDICINE PET SKULL BASE TO THIGH  Fasting Blood Glucose:  95  Technique:  19 mCi F-18 FDG was injected intravenously. CT data was obtained and used for attenuation correction and anatomic localization only.  (This was not acquired as a  diagnostic CT examination.) Additional exam technical data entered on technologist worksheet.  Comparison:  Chest CT 01/14/2013.  Findings:  Neck: A small focus of hypermetabolic activity in the left side of the mandible (SUVmax = 6.8)favored to represent underlying dental disease.  Extensive bilateral supraclavicular lymphadenopathy which is markedly hypermetabolic (SUVmax = 45.6 - 54.0), measuring up to 2.2 x 3.5 cm in the right supraclavicular station.  Chest:  Innumerable enlarged and hypermetabolic lymph nodes throughout the mediastinum and bilateral hilar regions (SUVmax = 56.0), measuring up to 4.1 x 3.5 cm and 4.0  x 3.3 cm in the high and low right paratracheal stations respectively.  A subcarinal nodal mass is present measuring up to 3.0 x 6.4 cm (SUVmax = 42.3). No definite intraparenchymal nodule or mass is confidently identified within the lungs.  However, there is extensive pleural nodularity which is hypermetabolic (SUVmax = 17.3 - 26.5). Moderate bilateral malignant pleural effusions are noted with associated passive atelectasis in portions of the lungs bilaterally.  Thickening of the peribronchovascular interstitium is noted, most pronounced in the lower lobes of the lungs bilaterally. Moderate centrilobular and paraseptal emphysema. Heart size is normal. There is no significant pericardial fluid, thickening or pericardial calcification.  The esophagus is displaced by subcarinal nodal mass, but is otherwise unremarkable in appearance.  Abdomen/Pelvis:  Extensive hypermetabolic activity throughout the upper abdomen and retroperitoneum, with numerous enlarged lymph nodes throughout these regions, measuring up to 18 x 25 mm in the gastrohepatic ligament, with a malignant range metabolic activity (SUVmax = 43.0).  No hypermetabolic activity or focal lesion identified within the liver, pancreas, spleen, left adrenal gland or either kidney.  In the lateral limb of the right adrenal gland there is a 2.0 x  0.7 cm intermediate attenuation nodule (39 HU) which demonstrates some low level of metabolic activity (SUVmax = 3.7), which could represent a metastatic lesion.  Retroperitoneal lymphadenopathy extends into the immediate infrarenal region, but does not extend into the anatomic pelvis.  No significant volume of ascites.  No pneumoperitoneum.  No pathologic distension of small bowel.  Status post hysterectomy.  Ovaries are unremarkable in appearance.  The bladder is normal in appearance.  Skeleton:  No focal hypermetabolic activity to suggest skeletal metastasis.  IMPRESSION: 1.  Extensive metastatic disease throughout the thorax and upper abdomen, as above.  An exact primary lesion in the lung is not confidently identified on today's examination, although any one of these pleural based hypermetabolic nodules can certainly be a primary lung lesion, this is not strongly favored.  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 2.  Potential small right adrenal metastasis also noted. 3.  Bilateral malignant pleural effusions. 4.  Additional incidental findings, as above.   Original Report Authenticated By: Trudie Reed, M.D.   US Biopsy  01/26/2013   *RADIOLOGY REPORT*  Clinical history:52 year old with extensive is neck and thoracic lymphadenopathy.  No malignant cells identified on pleural fluid cytology.  Definitive diagnosis is needed.  PROCEDURE(S): ULTRASOUND GUIDED CORE BIOPSIES OF A LEFT SUPRACLAVICULAR LYMPH NODE  Physician: Rachelle Hora. Henn, MD  Medications:None  Moderate sedation time:None  Fluoroscopy time: None  Procedure:The procedure was explained to the patient.  The risks and benefits of the procedure were discussed and the patient's questions were addressed.  Informed consent was obtained from the patient.  The neck was evaluated with ultrasound.  Multiple large lymph  nodes were identified on both sides of the neck.  A left supraclavicular lymph node was targeted for biopsy.  The left side of the neck was prepped and draped in a sterile fashion.  The skin was anesthetized with 1% lidocaine.  Using ultrasound guidance, four core biopsies were obtained with an 18 gauge core device. Samples placed in saline.  Findings:Multiple enlarged hypoechoic lymph nodes on both sides of the neck.  Needle position confirmed within the left supraclavicular lymph node.  Complications: None  Impression:Ultrasound guided core biopsies of a left supraclavicular lymph node.  Original Report Authenticated By: Richarda Overlie, M.D.   Dg Chest Port 1 View  01/15/2013   *RADIOLOGY REPORT*  Clinical Data: Follow-up effusions  PORTABLE CHEST - 1 VIEW  Comparison: 01/14/2013  Findings: Artifact overlies the chest.  Bilateral pleural effusions persist, the same more minimally larger.  There is associated atelectasis in the lower lungs.  Vascularity shows venous hypertension possibly with early interstitial edema.  The aorta is unfolded.  There is mediastinal widening.  IMPRESSION: Persistent bilateral effusions, possibly minimally larger.  Lower lobe atelectasis and/or infiltrate.  Mediastinal widening.   Original Report Authenticated By: Paulina Fusi, M.D.   Dg Chest Port 1 View  01/14/2013   *RADIOLOGY REPORT*  Clinical Data: Bilateral pleural effusions.  PORTABLE CHEST - 1 VIEW 01/14/2013 0609 hours:  Comparison: None.  Findings: Cardiac silhouette upper normal in size for AP portable technique. Thoracic aorta tortuous and atherosclerotic.  Right paratracheal opacity which may represent an ectatic ascending thoracic aorta.  Mild pulmonary venous hypertension without overt edema.  Large bilateral pleural effusions and associated consolidation in the lower lobes.  Patchy airspace opacities in the lingula and right middle lobe.  Biapical pleuroparenchymal scarring.  IMPRESSION:  1.  Large bilateral pleural  effusions with associated atelectasis and/or pneumonia in the lower lobes.  Patchy pneumonia is suspected in the lingula and right middle lobe. 2.  Right paratracheal opacity which may represent an ectatic ascending thoracic aorta.   Original Report Authenticated By: Hulan Saas, M.D.    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 She presented with extensive lymphadenopathy in the neck, mediastinum as well as abdomen and bilateral pleural effusion.    PLAN: I had a lengthy discussion with the patient and her husband today about her disease stage, prognosis and treatment options. I explained to the patient that she has incurable condition. I discussed with the patient her treatment options including palliative chemotherapy versus referral to palliative care and hospice. The patient is interested in proceeding with chemotherapy. I recommended for her regimen consisting of carboplatin for AUC of 5 on day 1 and Abraxane 100MG /M2 on days 1, 8 and 15 every 3 weeks. I discussed with the patient adverse effect of the chemotherapy including but not limited to alopecia, myelosuppression, nausea and vomiting, peripheral neuropathy, liver or renal dysfunction. I will arrange for the patient to have a chemotherapy education class before starting the first cycle of her chemotherapy. I will refer the patient to Dr. Donata Clay for consideration of Port-A-Cath placement before starting her chemotherapy. I will call her pharmacy was prescription for Compazine 10 mg by mouth every 6 hours as needed for nausea as well as EMLA cream to be applied to the Port-A-Cath site before chemotherapy. The patient is expected to start the first cycle of her chemotherapy next week. She would come back for follow up visit in 2 weeks for evaluation and management any adverse effects of her  chemotherapy. For the chronic back pain, the patient will continue with her pain clinic as previously scheduled.  The patient was advised to call me immediately if she has any concerning symptoms in the interval. Smoke cessation: she quit smoking 6 weeks ago. All questions were answered. The patient knows to call the clinic with any problems, questions or concerns. We can certainly see the patient much sooner if necessary.  Thank you so much for allowing me to  participate in the care of Jodi Shepherd. I will continue to follow up the patient with you and assist in her care.  I spent 45 minutes counseling the patient face to face. The total time spent in the appointment was 70 minutes.   Joseandres Mazer K. 02/03/2013, 7:15 PM

## 2013-02-06 ENCOUNTER — Telehealth: Payer: Self-pay | Admitting: *Deleted

## 2013-02-06 NOTE — Telephone Encounter (Signed)
Called pt to make aware of chemo time change for 6/26.  Port placement at 1 and chemo at 4pm.  She verbalized understanding of time and place

## 2013-02-06 NOTE — Telephone Encounter (Signed)
Pt called to request progress note to be faxed to pain management doctor and NP.  Called both facilities to get fax number and will get to Selena Batten to fax information.  I also left vm message regarding [facility] radiology dept to reschedule port

## 2013-02-07 ENCOUNTER — Other Ambulatory Visit: Payer: BC Managed Care – PPO

## 2013-02-07 ENCOUNTER — Encounter: Payer: Self-pay | Admitting: *Deleted

## 2013-02-07 NOTE — Progress Notes (Signed)
PCP is No PCP Per Patient Referring Provider is No ref. provider found  No chief complaint on file.   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 She is being prepared to undergo systemic chemotherapy and a Port-A-Cath placement has been requested. She denies prior history of thoracic surgery or thoracic trauma. Scan show minimal bilateral pleural effusion.   Past Medical History  Diagnosis Date  . Hypertension   . Hypothyroidism   . Anxiety   . Depression   . GERD (gastroesophageal reflux disease)     Past Surgical History  Procedure Laterality Date  . Abdominal hysterectomy    . Inner ear surgery      Family History  Problem Relation Age of Onset  . Diabetes Mellitus II Father     Social History History  Substance Use Topics  . Smoking status: Former Smoker -- 1.00 packs/day for 20 years    Types: Cigarettes    Quit date: 12/31/2012  . Smokeless tobacco: Never Used  . Alcohol Use: No    Current Outpatient Prescriptions  Medication Sig Dispense Refill  . ALPRAZolam (XANAX) 0.25 MG tablet Take 1 tablet (0.25 mg total) by mouth 3 (three) times daily as needed for anxiety.  30 tablet  0  . atenolol (TENORMIN) 25 MG tablet Take 75 mg by mouth 2 (two) times daily.      . Calcium-Magnesium-Vitamin D (CALCIUM 1200+D3) 600-40-500 MG-MG-UNIT TB24 Take 1 tablet by mouth daily.      . carisoprodol (SOMA) 350 MG tablet Take 350 mg by mouth 4 (four) times daily as needed.       Marland Kitchen co-enzyme Q-10 30 MG capsule Take 100 mg by mouth daily.      Marland Kitchen esomeprazole (NEXIUM) 40 MG capsule Take 40 mg by mouth daily before breakfast.      . estradiol (ESTRACE) 2 MG tablet Take 2 mg by mouth daily.      . fexofenadine (ALLEGRA) 180 MG tablet Take 180 mg by mouth daily.      Marland Kitchen FLUoxetine (PROZAC) 20 MG capsule Take 1 capsule by mouth daily.      . fluticasone (FLONASE) 50 MCG/ACT nasal spray Place 2 sprays  into the nose 2 (two) times daily.      Marland Kitchen lamoTRIgine (LAMICTAL) 100 MG tablet Take 1 tablet by mouth 2 (two) times daily.      Marland Kitchen levothyroxine (SYNTHROID, LEVOTHROID) 50 MCG tablet Take 50 mcg by mouth daily before breakfast.      . lidocaine-prilocaine (EMLA) cream Apply topically as needed.  30 g  0  . lisinopril (PRINIVIL,ZESTRIL) 30 MG tablet Take 30 mg by mouth daily.      . Multiple Vitamin (MULTIVITAMIN) tablet Take 1 tablet by mouth daily.      Marland Kitchen oxybutynin (DITROPAN-XL) 10 MG 24 hr tablet Take 10 mg by mouth at bedtime.      Marland Kitchen oxyCODONE (OXYCONTIN) 20 MG 12 hr tablet 1/2 tab by mouth every 2 hours as needed      . PROAIR HFA 108 (90 BASE) MCG/ACT inhaler 2 puffs every 4-6 hours as needed      . prochlorperazine (COMPAZINE) 10 MG tablet Take 1 tablet (10 mg total) by mouth every 6 (six) hours as needed.  60 tablet  0  . simvastatin (ZOCOR) 40 MG tablet Take 40 mg by mouth every evening.       No current facility-administered medications for this visit.  No Known Allergies  Review of Systems Gen. anxious and depressed recent diagnosis of advanced lung cancer, will stop smoking Cardiac no history of CAD heart failure or arrhythmia Endocrine positive for diabetes Hematologic no problems with bleeding disorder or previous blood transfusion Neurologic no history stroke or seizure There were no vitals taken for this visit. Physical Exam Gen. middle-aged female anxious but in no acute distress HEENT normocephalic pupils equal Neck fullness left supraclavicular fossa Breath sounds clear and equal Cardiac rhythm regular no murmur Abdomen soft Extremities nontender no edema Neuro no focal motor deficit  Diagnostic Tests: CT scan, PET scan reviewed  Impression: Stage IV squamous cell carcinoma left lung  Plan: Right Port-A-Cath placement plan for June 26 at [facility]-outpatient. Procedure indications benefits alternatives discussed with patient.

## 2013-02-09 ENCOUNTER — Ambulatory Visit (HOSPITAL_BASED_OUTPATIENT_CLINIC_OR_DEPARTMENT_OTHER): Payer: BC Managed Care – PPO

## 2013-02-09 ENCOUNTER — Other Ambulatory Visit (HOSPITAL_BASED_OUTPATIENT_CLINIC_OR_DEPARTMENT_OTHER): Payer: BC Managed Care – PPO | Admitting: Lab

## 2013-02-09 ENCOUNTER — Telehealth: Payer: Self-pay | Admitting: *Deleted

## 2013-02-09 VITALS — BP 102/67 | HR 68 | Temp 97.5°F | Resp 20

## 2013-02-09 DIAGNOSIS — C770 Secondary and unspecified malignant neoplasm of lymph nodes of head, face and neck: Secondary | ICD-10-CM

## 2013-02-09 DIAGNOSIS — C3491 Malignant neoplasm of unspecified part of right bronchus or lung: Secondary | ICD-10-CM

## 2013-02-09 DIAGNOSIS — C3490 Malignant neoplasm of unspecified part of unspecified bronchus or lung: Secondary | ICD-10-CM

## 2013-02-09 DIAGNOSIS — Z5111 Encounter for antineoplastic chemotherapy: Secondary | ICD-10-CM

## 2013-02-09 LAB — CBC WITH DIFFERENTIAL/PLATELET
Basophils Absolute: 0 10*3/uL (ref 0.0–0.1)
EOS%: 1.1 % (ref 0.0–7.0)
Eosinophils Absolute: 0.1 10*3/uL (ref 0.0–0.5)
HGB: 11.9 g/dL (ref 11.6–15.9)
LYMPH%: 11.3 % — ABNORMAL LOW (ref 14.0–49.7)
MCH: 27.7 pg (ref 25.1–34.0)
MCV: 84.7 fL (ref 79.5–101.0)
MONO%: 12.2 % (ref 0.0–14.0)
NEUT#: 7.4 10*3/uL — ABNORMAL HIGH (ref 1.5–6.5)
Platelets: 563 10*3/uL — ABNORMAL HIGH (ref 145–400)

## 2013-02-09 LAB — COMPREHENSIVE METABOLIC PANEL (CC13)
Alkaline Phosphatase: 110 U/L (ref 40–150)
BUN: 9.5 mg/dL (ref 7.0–26.0)
CO2: 28 mEq/L (ref 22–29)
Creatinine: 0.5 mg/dL — ABNORMAL LOW (ref 0.6–1.1)
Glucose: 124 mg/dl — ABNORMAL HIGH (ref 70–99)
Total Bilirubin: <0.2 mg/dL (ref 0.20–1.20)

## 2013-02-09 MED ORDER — PACLITAXEL PROTEIN-BOUND CHEMO INJECTION 100 MG
100.0000 mg/m2 | Freq: Once | INTRAVENOUS | Status: AC
  Administered 2013-02-09: 200 mg via INTRAVENOUS
  Filled 2013-02-09: qty 40

## 2013-02-09 MED ORDER — SODIUM CHLORIDE 0.9 % IV SOLN
Freq: Once | INTRAVENOUS | Status: AC
  Administered 2013-02-09: 13:00:00 via INTRAVENOUS

## 2013-02-09 MED ORDER — DEXAMETHASONE SODIUM PHOSPHATE 20 MG/5ML IJ SOLN
20.0000 mg | Freq: Once | INTRAMUSCULAR | Status: AC
  Administered 2013-02-09: 20 mg via INTRAVENOUS

## 2013-02-09 MED ORDER — ONDANSETRON 16 MG/50ML IVPB (CHCC)
16.0000 mg | Freq: Once | INTRAVENOUS | Status: AC
  Administered 2013-02-09: 16 mg via INTRAVENOUS

## 2013-02-09 MED ORDER — SODIUM CHLORIDE 0.9 % IV SOLN
750.0000 mg | Freq: Once | INTRAVENOUS | Status: AC
  Administered 2013-02-09: 750 mg via INTRAVENOUS
  Filled 2013-02-09: qty 75

## 2013-02-09 NOTE — Telephone Encounter (Signed)
Called pt left vm message regarding appt for chemo on 02/17/13 at 1:15.  The change is due to port placement

## 2013-02-09 NOTE — Patient Instructions (Signed)
[facility] Discharge Instructions for Patients Receiving Chemotherapy  Today you received the following chemotherapy agents :  Abraxane, Carboplatin.  To help prevent nausea and vomiting after your treatment, we encourage you to take your nausea medication as instructed by your physician.  Take Compazine 10 mg every 6 hrs as needed for nausea.  DO Not Drive after taking compazine since it can cause drowsiness.  DO NOT Eat greasy nor spicy foods;  DO Drink lots of fluids as tolerated.   If you develop nausea and vomiting that is not controlled by your nausea medication, call the clinic.   BELOW ARE SYMPTOMS THAT SHOULD BE REPORTED IMMEDIATELY:  *FEVER GREATER THAN 100.5 F  *CHILLS WITH OR WITHOUT FEVER  NAUSEA AND VOMITING THAT IS NOT CONTROLLED WITH YOUR NAUSEA MEDICATION  *UNUSUAL SHORTNESS OF BREATH  *UNUSUAL BRUISING OR BLEEDING  TENDERNESS IN MOUTH AND THROAT WITH OR WITHOUT PRESENCE OF ULCERS  *URINARY PROBLEMS  *BOWEL PROBLEMS  UNUSUAL RASH Items with * indicate a potential emergency and should be followed up as soon as possible.  Feel free to call the clinic you have any questions or concerns. The clinic phone number is [phone].

## 2013-02-10 ENCOUNTER — Encounter (HOSPITAL_COMMUNITY): Payer: Self-pay | Admitting: Pharmacy Technician

## 2013-02-10 ENCOUNTER — Telehealth: Payer: Self-pay | Admitting: *Deleted

## 2013-02-10 NOTE — Telephone Encounter (Signed)
PT.'S TEMPERATURE HAS BEEN 99 AT TIMES. INSTRUCTED PT. TO CALL IF TEMPERATURE REACHES 100.5 OR IF SHE HAS SHAKING CHILLS. AT TIMES PT. HAS BEEN A LITTLE DIZZY WHEN SHE GETS UP. ENCOURAGED PT. TO FORCE FLUIDS. PT. WAS CONSTIPATED BUT USED A STOOL SOFTENER WITH GOOD RESULTS TODAY. INSTRUCTED PT. THAT SHE MAY NEED TO USE A STOOL SOFTENER DAILY. PT. HAS THE CHEMOTHERAPY INFORMATION SHEET FOR A REFERENCE. SHE WILL CALL THIS OFFICE OR THE ON CALL PHYSICIAN IF ANY PROBLEMS ARISE.

## 2013-02-10 NOTE — Telephone Encounter (Signed)
Called & left message at home & cell # for return call to f/u on chemotherapy done yest.

## 2013-02-15 ENCOUNTER — Encounter (HOSPITAL_COMMUNITY): Payer: Self-pay | Admitting: *Deleted

## 2013-02-15 MED ORDER — CEFUROXIME SODIUM 1.5 G IJ SOLR
1.5000 g | INTRAMUSCULAR | Status: AC
  Administered 2013-02-16: 1.5 g via INTRAVENOUS
  Filled 2013-02-15: qty 1.5

## 2013-02-15 NOTE — Progress Notes (Signed)
Pt has been experiencing SOB; pt states " it is because of the cancer. " Pt denies chest pain and being under the care of a cardiologist. Pt made aware to stop taking Aspirin  and herbal medications. Do not take any NSAIDs ie: Ibuprofen, Advil, Naproxen or any medication containing Aspirin (Aspirin-Cinnamedrine-Caffeine (MIDOL MAXIMUM STRENGTH).   PO). Pt advised to bring inhaler on DOS.

## 2013-02-16 ENCOUNTER — Ambulatory Visit: Payer: BC Managed Care – PPO

## 2013-02-16 ENCOUNTER — Ambulatory Visit (HOSPITAL_COMMUNITY): Payer: BC Managed Care – PPO

## 2013-02-16 ENCOUNTER — Ambulatory Visit: Payer: BC Managed Care – PPO | Admitting: Internal Medicine

## 2013-02-16 ENCOUNTER — Ambulatory Visit (HOSPITAL_COMMUNITY): Payer: BC Managed Care – PPO | Admitting: Anesthesiology

## 2013-02-16 ENCOUNTER — Encounter (HOSPITAL_COMMUNITY): Payer: Self-pay | Admitting: *Deleted

## 2013-02-16 ENCOUNTER — Other Ambulatory Visit: Payer: BC Managed Care – PPO | Admitting: Lab

## 2013-02-16 ENCOUNTER — Telehealth: Payer: Self-pay | Admitting: Medical Oncology

## 2013-02-16 ENCOUNTER — Encounter (HOSPITAL_COMMUNITY)
Admission: RE | Disposition: A | Discharge status: Home / Self Care | Payer: Self-pay | Source: Ambulatory Visit | Attending: Cardiothoracic Surgery

## 2013-02-16 ENCOUNTER — Encounter (HOSPITAL_COMMUNITY): Payer: Self-pay | Admitting: Anesthesiology

## 2013-02-16 ENCOUNTER — Ambulatory Visit (HOSPITAL_COMMUNITY)
Admission: RE | Admit: 2013-02-16 | Discharge: 2013-02-16 | Disposition: A | Discharge status: Home / Self Care | Payer: BC Managed Care – PPO | Source: Ambulatory Visit | Attending: Cardiothoracic Surgery | Admitting: Cardiothoracic Surgery

## 2013-02-16 DIAGNOSIS — E039 Hypothyroidism, unspecified: Secondary | ICD-10-CM | POA: Insufficient documentation

## 2013-02-16 DIAGNOSIS — C3490 Malignant neoplasm of unspecified part of unspecified bronchus or lung: Secondary | ICD-10-CM

## 2013-02-16 DIAGNOSIS — C801 Malignant (primary) neoplasm, unspecified: Secondary | ICD-10-CM

## 2013-02-16 DIAGNOSIS — F3289 Other specified depressive episodes: Secondary | ICD-10-CM | POA: Insufficient documentation

## 2013-02-16 DIAGNOSIS — J4489 Other specified chronic obstructive pulmonary disease: Secondary | ICD-10-CM | POA: Insufficient documentation

## 2013-02-16 DIAGNOSIS — F411 Generalized anxiety disorder: Secondary | ICD-10-CM | POA: Insufficient documentation

## 2013-02-16 DIAGNOSIS — F329 Major depressive disorder, single episode, unspecified: Secondary | ICD-10-CM | POA: Insufficient documentation

## 2013-02-16 DIAGNOSIS — Z87891 Personal history of nicotine dependence: Secondary | ICD-10-CM | POA: Insufficient documentation

## 2013-02-16 DIAGNOSIS — Z79899 Other long term (current) drug therapy: Secondary | ICD-10-CM | POA: Insufficient documentation

## 2013-02-16 DIAGNOSIS — K219 Gastro-esophageal reflux disease without esophagitis: Secondary | ICD-10-CM | POA: Insufficient documentation

## 2013-02-16 DIAGNOSIS — I10 Essential (primary) hypertension: Secondary | ICD-10-CM | POA: Insufficient documentation

## 2013-02-16 DIAGNOSIS — J449 Chronic obstructive pulmonary disease, unspecified: Secondary | ICD-10-CM | POA: Insufficient documentation

## 2013-02-16 HISTORY — PX: PORTACATH PLACEMENT: SHX2246

## 2013-02-16 HISTORY — DX: Headache: R51

## 2013-02-16 HISTORY — DX: Malignant (primary) neoplasm, unspecified: C80.1

## 2013-02-16 HISTORY — DX: Unspecified osteoarthritis, unspecified site: M19.90

## 2013-02-16 HISTORY — DX: Diverticulosis of intestine, part unspecified, without perforation or abscess without bleeding: K57.90

## 2013-02-16 HISTORY — DX: Chronic obstructive pulmonary disease, unspecified: J44.9

## 2013-02-16 HISTORY — DX: Shortness of breath: R06.02

## 2013-02-16 LAB — COMPREHENSIVE METABOLIC PANEL
ALT: 30 U/L (ref 0–35)
AST: 27 U/L (ref 0–37)
Albumin: 2.3 g/dL — ABNORMAL LOW (ref 3.5–5.2)
Alkaline Phosphatase: 126 U/L — ABNORMAL HIGH (ref 39–117)
BUN: 13 mg/dL (ref 6–23)
CO2: 26 mEq/L (ref 19–32)
Calcium: 9.2 mg/dL (ref 8.4–10.5)
Chloride: 92 mEq/L — ABNORMAL LOW (ref 96–112)
Creatinine, Ser: 0.57 mg/dL (ref 0.50–1.10)
GFR calc Af Amer: >90 mL/min (ref >90)
GFR calc non Af Amer: >90 mL/min (ref >90)
Glucose, Bld: 97 mg/dL (ref 70–99)
Potassium: 3.9 mEq/L (ref 3.5–5.1)
Sodium: 131 mEq/L — ABNORMAL LOW (ref 135–145)
Total Bilirubin: 0.3 mg/dL (ref 0.3–1.2)
Total Protein: 7.1 g/dL (ref 6.0–8.3)

## 2013-02-16 LAB — CBC
HCT: 30.6 % — ABNORMAL LOW (ref 36.0–46.0)
Hemoglobin: 10.6 g/dL — ABNORMAL LOW (ref 12.0–15.0)
MCH: 28.1 pg (ref 26.0–34.0)
MCHC: 34.6 g/dL (ref 30.0–36.0)
MCV: 81.2 fL (ref 78.0–100.0)
Platelets: 469 10*3/uL — ABNORMAL HIGH (ref 150–400)
RBC: 3.77 MIL/uL — ABNORMAL LOW (ref 3.87–5.11)
RDW: 13.7 % (ref 11.5–15.5)
WBC: 11.5 10*3/uL — ABNORMAL HIGH (ref 4.0–10.5)

## 2013-02-16 LAB — SURGICAL PCR SCREEN
MRSA, PCR: NEGATIVE
Staphylococcus aureus: NEGATIVE

## 2013-02-16 LAB — PROTIME-INR
INR: 1.13 (ref 0.00–1.49)
Prothrombin Time: 14.3 seconds (ref 11.6–15.2)

## 2013-02-16 LAB — APTT: aPTT: 38 seconds — ABNORMAL HIGH (ref 24–37)

## 2013-02-16 SURGERY — INSERTION, TUNNELED CENTRAL VENOUS DEVICE, WITH PORT
Anesthesia: Monitor Anesthesia Care | Site: Chest | Laterality: Bilateral | Wound class: Clean

## 2013-02-16 MED ORDER — LACTATED RINGERS IV SOLN: INTRAVENOUS | Status: DC

## 2013-02-16 MED ORDER — FENTANYL CITRATE 0.05 MG/ML IJ SOLN
INTRAMUSCULAR | Status: DC | PRN
  Administered 2013-02-16: 25 ug via INTRAVENOUS
  Administered 2013-02-16: 150 ug via INTRAVENOUS

## 2013-02-16 MED ORDER — OXYCODONE HCL 5 MG PO TABS
5.0000 mg | ORAL_TABLET | Freq: Once | ORAL | Status: AC | PRN
  Administered 2013-02-16: 5 mg via ORAL

## 2013-02-16 MED ORDER — OXYCODONE HCL 5 MG/5ML PO SOLN: 5.0000 mg | Freq: Once | ORAL | Status: AC | PRN

## 2013-02-16 MED ORDER — LIDOCAINE-EPINEPHRINE (PF) 1 %-1:200000 IJ SOLN
INTRAMUSCULAR | Status: DC | PRN
  Administered 2013-02-16: 20 mL

## 2013-02-16 MED ORDER — FENTANYL CITRATE 0.05 MG/ML IJ SOLN: 25.0000 ug | INTRAMUSCULAR | Status: DC | PRN

## 2013-02-16 MED ORDER — MUPIROCIN 2 % EX OINT
TOPICAL_OINTMENT | Freq: Two times a day (BID) | CUTANEOUS | Status: DC
  Filled 2013-02-16: qty 22

## 2013-02-16 MED ORDER — MIDAZOLAM HCL 5 MG/5ML IJ SOLN
INTRAMUSCULAR | Status: DC | PRN
  Administered 2013-02-16: 2 mg via INTRAVENOUS

## 2013-02-16 MED ORDER — LACTATED RINGERS IV SOLN
INTRAVENOUS | Status: DC | PRN
  Administered 2013-02-16: 11:00:00 via INTRAVENOUS

## 2013-02-16 MED ORDER — LIDOCAINE-EPINEPHRINE (PF) 1 %-1:200000 IJ SOLN
INTRAMUSCULAR | Status: AC
  Filled 2013-02-16: qty 10

## 2013-02-16 MED ORDER — HEPARIN SODIUM (PORCINE) 10000 UNIT/ML IJ SOLN
INTRAMUSCULAR | Status: DC | PRN
  Administered 2013-02-16: 2 mL via SUBCUTANEOUS

## 2013-02-16 MED ORDER — SODIUM CHLORIDE 0.9 % IR SOLN
Status: DC | PRN
  Administered 2013-02-16: 11:00:00

## 2013-02-16 MED ORDER — HEPARIN SODIUM (PORCINE) 1000 UNIT/ML IJ SOLN
INTRAMUSCULAR | Status: AC
  Filled 2013-02-16: qty 1

## 2013-02-16 MED ORDER — MUPIROCIN 2 % EX OINT
TOPICAL_OINTMENT | CUTANEOUS | Status: AC
  Filled 2013-02-16: qty 22

## 2013-02-16 MED ORDER — PROMETHAZINE HCL 25 MG/ML IJ SOLN: 6.2500 mg | INTRAMUSCULAR | Status: DC | PRN

## 2013-02-16 MED ORDER — PROPOFOL INFUSION 10 MG/ML OPTIME
INTRAVENOUS | Status: DC | PRN
  Administered 2013-02-16: 120 ug/kg/min via INTRAVENOUS

## 2013-02-16 MED ORDER — SODIUM CHLORIDE 0.9 % IR SOLN
Status: DC | PRN
  Administered 2013-02-16: 1000 mL

## 2013-02-16 MED ORDER — OXYCODONE HCL 5 MG PO TABS
ORAL_TABLET | ORAL | Status: AC
  Filled 2013-02-16: qty 1

## 2013-02-16 SURGICAL SUPPLY — 45 items
BAG DECANTER FOR FLEXI CONT (MISCELLANEOUS) ×2 IMPLANT
BENZOIN TINCTURE PRP APPL 2/3 (GAUZE/BANDAGES/DRESSINGS) ×2 IMPLANT
BLADE SURG 11 STRL SS (BLADE) ×2 IMPLANT
CANISTER SUCTION 2500CC (MISCELLANEOUS) ×2 IMPLANT
CLOTH BEACON ORANGE TIMEOUT ST (SAFETY) ×2 IMPLANT
CLSR STERI-STRIP ANTIMIC 1/2X4 (GAUZE/BANDAGES/DRESSINGS) ×2 IMPLANT
COVER SURGICAL LIGHT HANDLE (MISCELLANEOUS) ×4 IMPLANT
DRAPE C-ARM 42X72 X-RAY (DRAPES) ×2 IMPLANT
DRAPE CHEST BREAST 15X10 FENES (DRAPES) ×2 IMPLANT
DRAPE LAPAROTOMY TRNSV 102X78 (DRAPE) IMPLANT
ELECT CAUTERY BLADE 6.4 (BLADE) ×2 IMPLANT
ELECT REM PT RETURN 9FT ADLT (ELECTROSURGICAL) ×2
ELECTRODE REM PT RTRN 9FT ADLT (ELECTROSURGICAL) ×1 IMPLANT
GLOVE BIO SURGEON STRL SZ 6 (GLOVE) ×4 IMPLANT
GLOVE BIO SURGEON STRL SZ7.5 (GLOVE) ×4 IMPLANT
GLOVE BIOGEL PI IND STRL 6 (GLOVE) ×1 IMPLANT
GLOVE BIOGEL PI INDICATOR 6 (GLOVE) ×1
GOWN STRL NON-REIN LRG LVL3 (GOWN DISPOSABLE) ×6 IMPLANT
GUIDEWIRE UNCOATED ST S 7038 (WIRE) IMPLANT
INTRODUCER 13FR (MISCELLANEOUS) IMPLANT
INTRODUCER COOK 11FR (CATHETERS) IMPLANT
KIT BASIN OR (CUSTOM PROCEDURE TRAY) ×2 IMPLANT
KIT PORT POWER 9.6FR MRI PREA (Catheter) IMPLANT
KIT PORT POWER ISP 8FR (Catheter) IMPLANT
KIT POWER CATH 8FR (Catheter) ×2 IMPLANT
KIT ROOM TURNOVER OR (KITS) ×2 IMPLANT
NEEDLE 18GX1X1/2 (RX/OR ONLY) (NEEDLE) ×2 IMPLANT
NEEDLE 22X1 1/2 (OR ONLY) (NEEDLE) ×2 IMPLANT
NEEDLE 25GX 5/8IN NON SAFETY (NEEDLE) ×2 IMPLANT
NS IRRIG 1000ML POUR BTL (IV SOLUTION) ×2 IMPLANT
PACK GENERAL/GYN (CUSTOM PROCEDURE TRAY) ×2 IMPLANT
PAD ARMBOARD 7.5X6 YLW CONV (MISCELLANEOUS) ×4 IMPLANT
SET SHEATH INTRODUCER 10FR (MISCELLANEOUS) IMPLANT
SPONGE GAUZE 4X4 12PLY (GAUZE/BANDAGES/DRESSINGS) ×2 IMPLANT
STRIP CLOSURE SKIN 1/2X4 (GAUZE/BANDAGES/DRESSINGS) IMPLANT
SUT ETHILON 3 0 FSL (SUTURE) ×2 IMPLANT
SUT VIC AB 3-0 SH 8-18 (SUTURE) ×2 IMPLANT
SUT VIC AB 3-0 X1 27 (SUTURE) ×2 IMPLANT
SYR 20CC LL (SYRINGE) ×2 IMPLANT
SYR CONTROL 10ML LL (SYRINGE) ×2 IMPLANT
SYRINGE 10CC LL (SYRINGE) ×2 IMPLANT
TAPE CLOTH SURG 4X10 WHT LF (GAUZE/BANDAGES/DRESSINGS) ×2 IMPLANT
TOWEL OR 17X24 6PK STRL BLUE (TOWEL DISPOSABLE) ×2 IMPLANT
TOWEL OR 17X26 10 PK STRL BLUE (TOWEL DISPOSABLE) ×2 IMPLANT
WATER STERILE IRR 1000ML POUR (IV SOLUTION) ×2 IMPLANT

## 2013-02-16 NOTE — Progress Notes (Signed)
TCTS  The patient was examined and preop studies reviewed. There has been no change from the prior exam and the patient is ready for surgery.  Plan L portacath on Lovenia Kim today

## 2013-02-16 NOTE — Anesthesia Postprocedure Evaluation (Signed)
  Anesthesia Post-op Note  Patient: Jodi Shepherd  Procedure(s) Performed: Procedure(s): INSERTION PORT-A-CATH (Bilateral)  Patient Location: PACU  Anesthesia Type:MAC  Level of Consciousness: awake  Airway and Oxygen Therapy: Patient Spontanous Breathing  Post-op Pain: mild  Post-op Assessment: Post-op Vital signs reviewed  Post-op Vital Signs: stable  Complications: No apparent anesthesia complications

## 2013-02-16 NOTE — Anesthesia Procedure Notes (Signed)
Procedure Name: MAC Date/Time: 02/16/2013 11:23 AM Performed by: Jerilee Hoh Pre-anesthesia Checklist: Patient identified, Emergency Drugs available, Suction available and Patient being monitored Patient Re-evaluated:Patient Re-evaluated prior to inductionOxygen Delivery Method: Simple face mask Intubation Type: IV induction Placement Confirmation: positive ETCO2 and breath sounds checked- equal and bilateral

## 2013-02-16 NOTE — Transfer of Care (Signed)
Immediate Anesthesia Transfer of Care Note  Patient: Jodi Shepherd  Procedure(s) Performed: Procedure(s): INSERTION PORT-A-CATH (Bilateral)  Patient Location: PACU  Anesthesia Type:MAC  Level of Consciousness: awake, alert , oriented and patient cooperative  Airway & Oxygen Therapy: Patient Spontanous Breathing and Patient connected to nasal cannula oxygen  Post-op Assessment: Report given to PACU RN, Post -op Vital signs reviewed and stable and Patient moving all extremities  Post vital signs: Reviewed and stable  Complications: No apparent anesthesia complications

## 2013-02-16 NOTE — Preoperative (Signed)
Beta Blockers   Reason not to administer Beta Blockers:Not Applicable 

## 2013-02-16 NOTE — Anesthesia Preprocedure Evaluation (Addendum)
Anesthesia Evaluation  Patient identified by MRN, date of birth, ID band Patient awake    Airway Mallampati: I  Neck ROM: Full    Dental   Pulmonary shortness of breath, COPDformer smoker,  Lung ca breath sounds clear to auscultation        Cardiovascular hypertension, Rhythm:Regular Rate:Normal     Neuro/Psych    GI/Hepatic GERD-  ,  Endo/Other  Hypothyroidism   Renal/GU      Musculoskeletal   Abdominal   Peds  Hematology   Anesthesia Other Findings   Reproductive/Obstetrics                           Anesthesia Physical Anesthesia Plan  ASA: III  Anesthesia Plan: MAC   Post-op Pain Management:    Induction: Intravenous  Airway Management Planned: Natural Airway and Simple Face Mask  Additional Equipment:   Intra-op Plan:   Post-operative Plan:   Informed Consent: I have reviewed the patients History and Physical, chart, labs and discussed the procedure including the risks, benefits and alternatives for the proposed anesthesia with the patient or authorized representative who has indicated his/her understanding and acceptance.   Dental advisory given  Plan Discussed with: CRNA and Surgeon  Anesthesia Plan Comments:        Anesthesia Quick Evaluation

## 2013-02-16 NOTE — Progress Notes (Signed)
Transported to X-ray

## 2013-02-16 NOTE — Brief Op Note (Signed)
02/16/2013  12:36 PM  PATIENT:  Floydene Flock  52 y.o. female  PRE-OPERATIVE DIAGNOSIS:  lung cancer  POST-OPERATIVE DIAGNOSIS:  lung cancer  PROCEDURE:  Procedure(s): INSERTION PORT-A-CATH (Bilateral)  SURGEON:  Surgeon(s) and Role:    * Kerin Perna, MD - Primary  PHYSICIAN ASSISTANT:   ASSISTANTS: none   ANESTHESIA:   local and MAC  EBL:     BLOOD ADMINISTERED:none  DRAINS: none   LOCAL MEDICATIONS USED:  LIDOCAINE  and Amount: 7ml  SPECIMEN:  No Specimen  DISPOSITION OF SPECIMEN:  N/A  COUNTS:  YES DICTATION: .Dragon Dictation  PLAN OF CARE: Discharge to home after PACU  PATIENT DISPOSITION:  PACU - hemodynamically stable.   Delay start of Pharmacological VTE agent (>24hrs) due to surgical blood loss or risk of bleeding: yes

## 2013-02-16 NOTE — Telephone Encounter (Signed)
Pt is on the way home after port placement. Per Dr Arbutus Ped he will see her tomorrow in am .

## 2013-02-17 ENCOUNTER — Encounter: Payer: Self-pay | Admitting: *Deleted

## 2013-02-17 ENCOUNTER — Ambulatory Visit (HOSPITAL_BASED_OUTPATIENT_CLINIC_OR_DEPARTMENT_OTHER): Payer: BC Managed Care – PPO

## 2013-02-17 ENCOUNTER — Other Ambulatory Visit (HOSPITAL_BASED_OUTPATIENT_CLINIC_OR_DEPARTMENT_OTHER): Payer: BC Managed Care – PPO | Admitting: Lab

## 2013-02-17 ENCOUNTER — Other Ambulatory Visit: Payer: Self-pay | Admitting: Medical Oncology

## 2013-02-17 ENCOUNTER — Ambulatory Visit: Payer: BC Managed Care – PPO | Admitting: Internal Medicine

## 2013-02-17 ENCOUNTER — Encounter (HOSPITAL_COMMUNITY): Payer: Self-pay | Admitting: Cardiothoracic Surgery

## 2013-02-17 ENCOUNTER — Telehealth: Payer: Self-pay | Admitting: Medical Oncology

## 2013-02-17 DIAGNOSIS — C770 Secondary and unspecified malignant neoplasm of lymph nodes of head, face and neck: Secondary | ICD-10-CM

## 2013-02-17 DIAGNOSIS — Z5111 Encounter for antineoplastic chemotherapy: Secondary | ICD-10-CM

## 2013-02-17 DIAGNOSIS — C3490 Malignant neoplasm of unspecified part of unspecified bronchus or lung: Secondary | ICD-10-CM

## 2013-02-17 LAB — CBC WITH DIFFERENTIAL/PLATELET
BASO%: 0.2 % (ref 0.0–2.0)
Eosinophils Absolute: 0 10*3/uL (ref 0.0–0.5)
HCT: 31.5 % — ABNORMAL LOW (ref 34.8–46.6)
LYMPH%: 8.4 % — ABNORMAL LOW (ref 14.0–49.7)
MCHC: 33 g/dL (ref 31.5–36.0)
MCV: 82.9 fL (ref 79.5–101.0)
MONO#: 1.5 10*3/uL — ABNORMAL HIGH (ref 0.1–0.9)
MONO%: 11.8 % (ref 0.0–14.0)
NEUT%: 79.4 % — ABNORMAL HIGH (ref 38.4–76.8)
Platelets: 438 10*3/uL — ABNORMAL HIGH (ref 145–400)
RBC: 3.8 10*6/uL (ref 3.70–5.45)
WBC: 12.5 10*3/uL — ABNORMAL HIGH (ref 3.9–10.3)

## 2013-02-17 LAB — COMPREHENSIVE METABOLIC PANEL (CC13)
ALT: 45 U/L (ref 0–55)
CO2: 25 mEq/L (ref 22–29)
Creatinine: 0.6 mg/dL (ref 0.6–1.1)
Glucose: 105 mg/dl (ref 70–140)
Total Bilirubin: 0.21 mg/dL (ref 0.20–1.20)

## 2013-02-17 MED ORDER — SODIUM CHLORIDE 0.9 % IJ SOLN
10.0000 mL | INTRAMUSCULAR | Status: DC | PRN
  Administered 2013-02-17: 10 mL
  Filled 2013-02-17: qty 10

## 2013-02-17 MED ORDER — SODIUM CHLORIDE 0.9 % IV SOLN
Freq: Once | INTRAVENOUS | Status: AC
  Administered 2013-02-17: 14:00:00 via INTRAVENOUS

## 2013-02-17 MED ORDER — HEPARIN SOD (PORK) LOCK FLUSH 100 UNIT/ML IV SOLN
500.0000 [IU] | Freq: Once | INTRAVENOUS | Status: AC | PRN
  Administered 2013-02-17: 500 [IU]
  Filled 2013-02-17: qty 5

## 2013-02-17 MED ORDER — PACLITAXEL PROTEIN-BOUND CHEMO INJECTION 100 MG
100.0000 mg/m2 | Freq: Once | INTRAVENOUS | Status: AC
  Administered 2013-02-17: 200 mg via INTRAVENOUS
  Filled 2013-02-17: qty 40

## 2013-02-17 MED ORDER — DEXAMETHASONE SODIUM PHOSPHATE 10 MG/ML IJ SOLN
10.0000 mg | Freq: Once | INTRAMUSCULAR | Status: AC
  Administered 2013-02-17: 10 mg via INTRAVENOUS

## 2013-02-17 MED ORDER — ONDANSETRON 8 MG/50ML IVPB (CHCC)
8.0000 mg | Freq: Once | INTRAVENOUS | Status: AC
  Administered 2013-02-17: 8 mg via INTRAVENOUS

## 2013-02-17 NOTE — Telephone Encounter (Signed)
Pt coming in today for chemo.

## 2013-02-17 NOTE — Progress Notes (Signed)
Per Dr Arbutus Ped -give chemotherapy today

## 2013-02-17 NOTE — Op Note (Signed)
Jodi Shepherd, Jodi Shepherd NO.:  [id number]  MEDICAL RECORD NO.:  [id number]  LOCATION:  [facility]                         FACILITY:  [facility]  PHYSICIAN:  Kerin Perna, M.D.  DATE OF BIRTH:  [birth year]  DATE OF PROCEDURE:  02/16/2013 DATE OF DISCHARGE:  02/16/2013                              OPERATIVE REPORT   OPERATION:  Placement of left subclavian Port-A-Cath.  SURGEON:  Kerin Perna, M.D.  PREOPERATIVE DIAGNOSIS:  Advanced non-small cell carcinoma of the lung.  POSTOPERATIVE DIAGNOSIS:  Advanced non-small cell carcinoma of the lung.  ANESTHESIA:  Local 1% lidocaine with IV conscious sedation, monitored by Anesthesia.  INDICATIONS:  The patient is a very nice, but unfortunate 52 year old female with advanced stage lung cancer, being treated with chemotherapy by Dr. Arbutus Ped.  The Port-A-Cath was felt to be indicated for therapy and I discussed the procedure in detail with her including the indications, benefits, alternatives, and risks.  She agreed to proceed with the operation.  OPERATIVE PROCEDURE:  The patient was brought to the operating room and placed supine on the operating room table where the patient was given IV conscious sedation and monitored.  The left chest was prepped and draped as a sterile field.  A proper time-out was performed.  Local 1% lidocaine was infiltrated beneath the left clavicle and in the third interspace in the midclavicular line for the Port-A-Cath pocket.  First, the left subclavian vein was cannulated using the Seldinger technique and a guidewire was passed into the vena cava, confirmed by C-arm fluoroscopy.  Next, an incision was made in the anterior chest wall for the pocket, which was anesthetized with lidocaine.  Next, the catheter was flushed with heparinized saline and the catheter was tunneled from the lower incision to the upper incision.  Next, over the guidewire, dilator sheath system was inserted and advanced.   Then, through the tearaway sheath, the Port-A-Cath was advanced into the superior vena cava.  The Port-A-Cath was connected and secured to the reservoir and placed in the pocket and flushed with heparinized saline and then 2 mL of concentrated heparin.  The incision was then closed in layers using Vicryl and sterile dressings were applied.  The patient was then returned to the recovery room where chest x-ray showed no pneumothorax, adequate placement of the Port-A-Cath.    Kerin Perna, M.D.    PV/MEDQ  D:  02/16/2013  T:  02/17/2013  Job:  [id number]

## 2013-02-22 ENCOUNTER — Other Ambulatory Visit: Payer: Self-pay | Admitting: *Deleted

## 2013-02-22 ENCOUNTER — Telehealth: Payer: Self-pay | Admitting: Internal Medicine

## 2013-02-22 NOTE — Telephone Encounter (Signed)
Pt called and reqsted to see md tomorrow, nurse aware

## 2013-02-22 NOTE — Telephone Encounter (Signed)
Talked to pt, gave her appt date for tomorrow lab, MD and chemo

## 2013-02-23 ENCOUNTER — Encounter: Payer: Self-pay | Admitting: Internal Medicine

## 2013-02-23 ENCOUNTER — Ambulatory Visit (HOSPITAL_BASED_OUTPATIENT_CLINIC_OR_DEPARTMENT_OTHER): Payer: BC Managed Care – PPO

## 2013-02-23 ENCOUNTER — Other Ambulatory Visit: Payer: BC Managed Care – PPO | Admitting: Lab

## 2013-02-23 ENCOUNTER — Ambulatory Visit: Payer: BC Managed Care – PPO | Admitting: Internal Medicine

## 2013-02-23 ENCOUNTER — Other Ambulatory Visit (HOSPITAL_BASED_OUTPATIENT_CLINIC_OR_DEPARTMENT_OTHER): Payer: BC Managed Care – PPO | Admitting: Lab

## 2013-02-23 ENCOUNTER — Ambulatory Visit (HOSPITAL_BASED_OUTPATIENT_CLINIC_OR_DEPARTMENT_OTHER): Payer: BC Managed Care – PPO | Admitting: Internal Medicine

## 2013-02-23 VITALS — BP 122/77 | HR 80 | Temp 96.2°F | Resp 20 | Ht 65.0 in | Wt 157.2 lb

## 2013-02-23 DIAGNOSIS — C3490 Malignant neoplasm of unspecified part of unspecified bronchus or lung: Secondary | ICD-10-CM

## 2013-02-23 DIAGNOSIS — C770 Secondary and unspecified malignant neoplasm of lymph nodes of head, face and neck: Secondary | ICD-10-CM

## 2013-02-23 DIAGNOSIS — Z5111 Encounter for antineoplastic chemotherapy: Secondary | ICD-10-CM

## 2013-02-23 DIAGNOSIS — C3491 Malignant neoplasm of unspecified part of right bronchus or lung: Secondary | ICD-10-CM

## 2013-02-23 LAB — CBC WITH DIFFERENTIAL/PLATELET
Eosinophils Absolute: 0 10*3/uL (ref 0.0–0.5)
HCT: 28.9 % — ABNORMAL LOW (ref 34.8–46.6)
LYMPH%: 12.3 % — ABNORMAL LOW (ref 14.0–49.7)
MONO#: 0.6 10*3/uL (ref 0.1–0.9)
NEUT#: 4.4 10*3/uL (ref 1.5–6.5)
NEUT%: 76.4 % (ref 38.4–76.8)
Platelets: 324 10*3/uL (ref 145–400)
WBC: 5.8 10*3/uL (ref 3.9–10.3)

## 2013-02-23 LAB — COMPREHENSIVE METABOLIC PANEL (CC13)
CO2: 28 mEq/L (ref 22–29)
Creatinine: 0.6 mg/dL (ref 0.6–1.1)
Glucose: 150 mg/dl — ABNORMAL HIGH (ref 70–140)
Total Bilirubin: 0.2 mg/dL (ref 0.20–1.20)
Total Protein: 7.3 g/dL (ref 6.4–8.3)

## 2013-02-23 MED ORDER — SODIUM CHLORIDE 0.9 % IV SOLN
Freq: Once | INTRAVENOUS | Status: AC
  Administered 2013-02-23: 14:00:00 via INTRAVENOUS

## 2013-02-23 MED ORDER — SODIUM CHLORIDE 0.9 % IJ SOLN
10.0000 mL | INTRAMUSCULAR | Status: DC | PRN
  Administered 2013-02-23: 10 mL
  Filled 2013-02-23: qty 10

## 2013-02-23 MED ORDER — PACLITAXEL PROTEIN-BOUND CHEMO INJECTION 100 MG
100.0000 mg/m2 | Freq: Once | INTRAVENOUS | Status: AC
  Administered 2013-02-23: 200 mg via INTRAVENOUS
  Filled 2013-02-23: qty 40

## 2013-02-23 MED ORDER — DEXAMETHASONE SODIUM PHOSPHATE 10 MG/ML IJ SOLN
10.0000 mg | Freq: Once | INTRAMUSCULAR | Status: AC
  Administered 2013-02-23: 10 mg via INTRAVENOUS

## 2013-02-23 MED ORDER — HEPARIN SOD (PORK) LOCK FLUSH 100 UNIT/ML IV SOLN
500.0000 [IU] | Freq: Once | INTRAVENOUS | Status: AC | PRN
  Administered 2013-02-23: 500 [IU]
  Filled 2013-02-23: qty 5

## 2013-02-23 MED ORDER — ONDANSETRON 8 MG/50ML IVPB (CHCC)
8.0000 mg | Freq: Once | INTRAVENOUS | Status: AC
  Administered 2013-02-23: 8 mg via INTRAVENOUS

## 2013-02-23 NOTE — Patient Instructions (Signed)
You are tolerating your chemotherapy fairly well. We will proceed with day 15 of the first cycle today as scheduled. Followup visit in 2 weeks.

## 2013-02-23 NOTE — Progress Notes (Signed)
[facility] Telephone:([phone]) [phone]   [phone]  OFFICE PROGRESS NOTE  WHITLOW, [name], MD [address] [place] [address]  DIAGNOSIS: Stage IV non-small cell lung cancer, squamous cell carcinoma diagnosed in June of 2014.  PRIOR THERAPY: None  CURRENT THERAPY: Systemic chemotherapy with carboplatin for AUC of 5 on day 1 and Abraxane 100 mg/M2 on days 1, 8 and 15 every 3 weeks. First dose started 02/09/2013.  INTERVAL HISTORY: Aldonia Keeven 52 y.o. female returns to the clinic today for followup visit accompanied her husband. The patient tolerated the first dose of her systemic chemotherapy fairly well with no significant adverse effects except for mild nausea results with Compazine. She denied having any significant weight loss or night sweats. The patient denied having any chest pain, shortness of breath, cough or hemoptysis. She feels the lymph nodes in the supraclavicular area are getting smaller. She is here today to start day 15 of the first cycle of her chemotherapy.  MEDICAL HISTORY: Past Medical History  Diagnosis Date  . Hypertension   . Hypothyroidism   . Anxiety   . Depression   . GERD (gastroesophageal reflux disease)   . Cancer     non- small cell squamous  . Shortness of breath   . COPD (chronic obstructive pulmonary disease)   . Headache(784.0)   . Arthritis     Hx; of osteoarthritis  . Diverticulosis     Hx: of    ALLERGIES:  has No Known Allergies.  MEDICATIONS:  Current Outpatient Prescriptions  Medication Sig Dispense Refill  . ALPRAZolam (XANAX) 0.25 MG tablet Take 1 tablet (0.25 mg total) by mouth 3 (three) times daily as needed for anxiety.  30 tablet  0  . atenolol (TENORMIN) 25 MG tablet Take 75 mg by mouth 2 (two) times daily.      . Calcium-Magnesium-Vitamin D (CALCIUM 1200+D3) 600-40-500 MG-MG-UNIT TB24 Take 1 tablet by mouth daily.      . carisoprodol (SOMA) 350 MG tablet Take 350 mg by mouth 4 (four) times daily as  needed for muscle spasms.      . Coenzyme Q10 (CO Q 10) 100 MG CAPS Take 1 capsule by mouth daily.      Marland Kitchen docusate sodium (COLACE) 100 MG capsule Take 100 mg by mouth as needed for constipation.      Marland Kitchen esomeprazole (NEXIUM) 40 MG capsule Take 40 mg by mouth every other day.       . estradiol (ESTRACE) 2 MG tablet Take 2 mg by mouth daily.      . fexofenadine (ALLEGRA) 180 MG tablet Take 180 mg by mouth daily as needed (alleriges).       Marland Kitchen FLUoxetine (PROZAC) 20 MG capsule Take 20 mg by mouth daily.       . fluticasone (FLONASE) 50 MCG/ACT nasal spray Place 2 sprays into the nose 2 (two) times daily.      Marland Kitchen lamoTRIgine (LAMICTAL) 100 MG tablet Take 100 mg by mouth 2 (two) times daily.       Marland Kitchen levothyroxine (SYNTHROID, LEVOTHROID) 50 MCG tablet Take 50 mcg by mouth daily before breakfast.      . lidocaine-prilocaine (EMLA) cream Apply 1 application topically as needed (to port site).      Marland Kitchen lisinopril (PRINIVIL,ZESTRIL) 30 MG tablet Take 30 mg by mouth daily.      . Multiple Vitamin (MULTIVITAMIN) tablet Take 1 tablet by mouth daily.      . NONFORMULARY  OR COMPOUNDED ITEM Apply 1 application topically 3 (three) times daily as needed (for pain). Special compounded cream from [facility] in [place], [place] ; contains ketamine, ketoprofen, amitriptyline.      Marland Kitchen oxybutynin (DITROPAN-XL) 10 MG 24 hr tablet Take 10 mg by mouth at bedtime.      . Oxycodone HCl 20 MG TABS Take 20 mg by mouth 5 (five) times daily.      Marland Kitchen PROAIR HFA 108 (90 BASE) MCG/ACT inhaler Inhale 2 puffs into the lungs every 4 (four) hours as needed for wheezing or shortness of breath.       . prochlorperazine (COMPAZINE) 10 MG tablet Take 10 mg by mouth every 6 (six) hours as needed (nausea).      . simvastatin (ZOCOR) 40 MG tablet Take 40 mg by mouth every evening.      Marland Kitchen ascorbic acid (VITAMIN C) 1000 MG tablet Take 1,000 mg by mouth daily.      . Aspirin-Cinnamedrine-Caffeine (MIDOL MAXIMUM STRENGTH PO) Take 1 tablet by mouth 2  (two) times daily as needed (headaches).       No current facility-administered medications for this visit.    SURGICAL HISTORY:  Past Surgical History  Procedure Laterality Date  . Abdominal hysterectomy    . Inner ear surgery    . Right -sided thoracic outlet surgery      Hx: of  . Colonoscopy w/ biopsies and polypectomy      Hx: of  . Rectal fistula repair      Hx: of  . Hemorrhoid surgery      Hx: of  . Portacath placement Bilateral 02/16/2013    Procedure: INSERTION PORT-A-CATH;  Surgeon: Kerin Perna, MD;  Location: [facility] OR;  Service: Thoracic;  Laterality: Bilateral;    REVIEW OF SYSTEMS:  A comprehensive review of systems was negative except for: Constitutional: positive for fatigue   PHYSICAL EXAMINATION: General appearance: alert, cooperative, fatigued and no distress Head: Normocephalic, without obvious abnormality, atraumatic Neck: no adenopathy Lymph nodes: Cervical, supraclavicular, and axillary nodes normal. Resp: clear to auscultation bilaterally Cardio: regular rate and rhythm, S1, S2 normal, no murmur, click, rub or gallop GI: soft, non-tender; bowel sounds normal; no masses,  no organomegaly Extremities: extremities normal, atraumatic, no cyanosis or edema Neurologic: Alert and oriented X 3, normal strength and tone. Normal symmetric reflexes. Normal coordination and gait  ECOG PERFORMANCE STATUS: 1 - Symptomatic but completely ambulatory  Blood pressure 122/77, pulse 80, temperature 96.2 F (35.7 C), temperature source Oral, resp. rate 20, height 5\' 5"  (1.651 m), weight 157 lb 3.2 oz (71.305 kg).  LABORATORY DATA: Lab Results  Component Value Date   WBC 12.5* 02/17/2013   HGB 10.4* 02/17/2013   HCT 31.5* 02/17/2013   MCV 82.9 02/17/2013   PLT 438* 02/17/2013      Chemistry      Component Value Date/Time   NA 129* 02/17/2013 1338   NA 131* 02/16/2013 0953   K 4.0 02/17/2013 1338   K 3.9 02/16/2013 0953   CL 92* 02/16/2013 0953   CL 96* 02/09/2013 1131     CO2 25 02/17/2013 1338   CO2 26 02/16/2013 0953   BUN 10.2 02/17/2013 1338   BUN 13 02/16/2013 0953   CREATININE 0.6 02/17/2013 1338   CREATININE 0.57 02/16/2013 0953      Component Value Date/Time   CALCIUM 9.5 02/17/2013 1338   CALCIUM 9.2 02/16/2013 0953   ALKPHOS 138 02/17/2013 1338   ALKPHOS 126* 02/16/2013 1610  AST 33 02/17/2013 1338   AST 27 02/16/2013 0953   ALT 45 02/17/2013 1338   ALT 30 02/16/2013 0953   BILITOT 0.21 02/17/2013 1338   BILITOT 0.3 02/16/2013 0953       RADIOGRAPHIC STUDIES: Dg Chest 2 View Within Previous 72 Hours.  Films Obtained On Friday Are Acceptable For Monday And Tuesday Cases  02/16/2013   *RADIOLOGY REPORT*  Clinical Data: Preop, history of lung carcinoma  CHEST - 2 VIEW  Comparison: PET CT of 02/02/2013 and chest x-ray of 01/24/2013  Findings: There has been interval response to therapy with decrease in mediastinal adenopathy and decrease in pleural soft tissue masses.  Bilateral pleural effusions remain left larger than right. Mild cardiomegaly is stable.  No acute bony abnormalities seen  IMPRESSION:  1.  Interval response to therapy with some improvement in mediastinal adenopathy as well as decrease in pleural metastatic lesions. 2.  Bilateral pleural effusions left larger than right.   Original Report Authenticated By: Dwyane Dee, M.D.   Dg Chest 2 View  01/24/2013   *RADIOLOGY REPORT*  Clinical Data: Follow-up pleural effusion.  Shortness of breath, cough, hypertension.  History of smoking, lung cancer.  CHEST - 2 VIEW  Comparison: 01/15/2013 chest x-ray and chest CT 01/14/2013  Findings: There are bilateral pleural effusions, similar in quantity compared to prior study.  Probable loculated component is identified laterally at the right lung apex.  Bulky mediastinal and hilar adenopathy are noted, as seen on prior chest CT.  There is patchy density at the lung bases, consistent with atelectasis / consolidation.  The heart is enlarged.  No pulmonary edema.   IMPRESSION:  1.  Cardiomegaly without pulmonary edema. 2.  Bilateral pleural effusions with loculated component seen in the right lung apex. 3.  Bulky mediastinal and hilar adenopathy.   Original Report Authenticated By: Norva Pavlov, M.D.   Mr Laqueta Jean Wo Contrast  02/02/2013   *RADIOLOGY REPORT*  Clinical Data: 52 year old female with advanced thoracic malignancy, metastatic carcinoma most suggestive of non-small cell carcinoma on the left supraclavicular lymph node biopsy. Staging.  MRI HEAD WITHOUT AND WITH CONTRAST  Technique:  Multiplanar, multiecho pulse sequences of the brain and surrounding structures were obtained according to standard protocol without and with intravenous contrast  Contrast: 15mL MULTIHANCE GADOBENATE DIMEGLUMINE 529 MG/ML IV SOLN  Comparison: Chest CT 01/14/2013.  Findings: No abnormal enhancement identified.  No midline shift, mass effect, or evidence of mass lesion.  No abnormal diffusion signal. No restricted diffusion to suggest acute infarction.  No acute intracranial hemorrhage identified. Normal cerebral volume.  No ventriculomegaly.  Negative pituitary, cervicomedullary junction and visualized cervical spine.  Skull and visualized upper cervical spine bone marrow signal is within normal limits.  No dural thickening identified.  Small chronic lacunar infarct right caudate nucleus.  Otherwise minimal cerebral white matter T2 and FLAIR hyperintensity.  Deep gray matter nuclei, brainstem and cerebellum within normal limits. Major intracranial vascular flow voids are preserved.  Mastoids are clear.  Grossly normal visualized internal auditory structures.  Paranasal sinuses are clear. Visualized orbit soft tissues are within normal limits.  Negative scalp soft tissues. Previously seen lower cervical lymph node enlargement not included on these images.  IMPRESSION: 1. No acute or metastatic intracranial abnormality. 2.  Minimal to mild chronic small vessel ischemia.   Original  Report Authenticated By: Erskine Speed, M.D.   Nm Pet Image Initial (pi) Skull Base To Thigh  02/02/2013   *RADIOLOGY REPORT*  Clinical Data: Initial  treatment strategy for lung cancer. Staging scan.  NUCLEAR MEDICINE PET SKULL BASE TO THIGH  Fasting Blood Glucose:  95  Technique:  19 mCi F-18 FDG was injected intravenously. CT data was obtained and used for attenuation correction and anatomic localization only.  (This was not acquired as a diagnostic CT examination.) Additional exam technical data entered on technologist worksheet.  Comparison:  Chest CT 01/14/2013.  Findings:  Neck: A small focus of hypermetabolic activity in the left side of the mandible (SUVmax = 6.8)favored to represent underlying dental disease.  Extensive bilateral supraclavicular lymphadenopathy which is markedly hypermetabolic (SUVmax = 45.6 - 54.0), measuring up to 2.2 x 3.5 cm in the right supraclavicular station.  Chest:  Innumerable enlarged and hypermetabolic lymph nodes throughout the mediastinum and bilateral hilar regions (SUVmax = 56.0), measuring up to 4.1 x 3.5 cm and 4.0 x 3.3 cm in the high and low right paratracheal stations respectively.  A subcarinal nodal mass is present measuring up to 3.0 x 6.4 cm (SUVmax = 42.3). No definite intraparenchymal nodule or mass is confidently identified within the lungs.  However, there is extensive pleural nodularity which is hypermetabolic (SUVmax = 17.3 - 26.5). Moderate bilateral malignant pleural effusions are noted with associated passive atelectasis in portions of the lungs bilaterally.  Thickening of the peribronchovascular interstitium is noted, most pronounced in the lower lobes of the lungs bilaterally. Moderate centrilobular and paraseptal emphysema. Heart size is normal. There is no significant pericardial fluid, thickening or pericardial calcification.  The esophagus is displaced by subcarinal nodal mass, but is otherwise unremarkable in appearance.  Abdomen/Pelvis:  Extensive  hypermetabolic activity throughout the upper abdomen and retroperitoneum, with numerous enlarged lymph nodes throughout these regions, measuring up to 18 x 25 mm in the gastrohepatic ligament, with a malignant range metabolic activity (SUVmax = 43.0).  No hypermetabolic activity or focal lesion identified within the liver, pancreas, spleen, left adrenal gland or either kidney.  In the lateral limb of the right adrenal gland there is a 2.0 x 0.7 cm intermediate attenuation nodule (39 HU) which demonstrates some low level of metabolic activity (SUVmax = 3.7), which could represent a metastatic lesion.  Retroperitoneal lymphadenopathy extends into the immediate infrarenal region, but does not extend into the anatomic pelvis.  No significant volume of ascites.  No pneumoperitoneum.  No pathologic distension of small bowel.  Status post hysterectomy.  Ovaries are unremarkable in appearance.  The bladder is normal in appearance.  Skeleton:  No focal hypermetabolic activity to suggest skeletal metastasis.  IMPRESSION: 1.  Extensive metastatic disease throughout the thorax and upper abdomen, as above.  An exact primary lesion in the lung is not confidently identified on today's examination, although any one of these pleural based hypermetabolic nodules can certainly be a primary lung lesion, this is not strongly favored.  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 2.  Potential small right adrenal metastasis also noted. 3.  Bilateral malignant pleural effusions. 4.  Additional incidental findings, as above.   Original Report Authenticated By: Trudie Reed, M.D.   US Biopsy  01/26/2013   *RADIOLOGY REPORT*  Clinical history:52 year old with extensive is neck and thoracic lymphadenopathy.  No malignant cells identified on pleural fluid cytology.  Definitive diagnosis is needed.   PROCEDURE(S): ULTRASOUND GUIDED CORE BIOPSIES OF A LEFT SUPRACLAVICULAR LYMPH NODE  Physician: Rachelle Hora. Lowella Dandy, MD  Medications:None  Moderate sedation  time:None  Fluoroscopy time: None  Procedure:The procedure was explained to the patient.  The risks and benefits of the procedure were discussed and the patient's questions were addressed.  Informed consent was obtained from the patient.  The neck was evaluated with ultrasound.  Multiple large lymph nodes were identified on both sides of the neck.  A left supraclavicular lymph node was targeted for biopsy.  The left side of the neck was prepped and draped in a sterile fashion.  The skin was anesthetized with 1% lidocaine.  Using ultrasound guidance, four core biopsies were obtained with an 18 gauge core device. Samples placed in saline.  Findings:Multiple enlarged hypoechoic lymph nodes on both sides of the neck.  Needle position confirmed within the left supraclavicular lymph node.  Complications: None  Impression:Ultrasound guided core biopsies of a left supraclavicular lymph node.   Original Report Authenticated By: Richarda Overlie, M.D.   Dg Chest Port 1 View  02/16/2013   *RADIOLOGY REPORT*  Clinical Data: History of lung cancer, post porta-a-catheter placement  PORTABLE CHEST - 1 VIEW  Comparison: 02/16/2013; PET CT - 02/03/2023  Findings:  Grossly unchanged enlarged cardiac silhouette and mediastinal contours with nodular prominence of the mediastinum compatible with known bulky hilar lymphadenopathy.  Interval placement of a left anterior chest wall subclavian vein approach port catheter with tip terminating over the mid SVC.  No pneumothorax.  Grossly unchanged partially loculated small bilateral effusions and associated bibasilar opacities, left greater than right.  There is grossly unchanged diffuse slightly nodular interstitial thickening. No new focal airspace opacities.  No definite evidence of edema. Unchanged bones.  IMPRESSION: 1.  Interval placement of  left subclavian vein approach port-a- catheter with tip projecting over the mid SVC.  No pneumothorax. 2.  Grossly unchanged findings of extensive metastatic disease to the chest.   Original Report Authenticated By: Tacey Ruiz, MD   Dg Fluoro Guide Cv Line-no Report  02/16/2013   CLINICAL DATA: porta catheter insertion   FLOURO GUIDE CV LINE  Fluoroscopy was utilized by the requesting physician.  No radiographic  interpretation.     ASSESSMENT AND PLAN: This is a very pleasant 52 years old white female recently diagnosed with metastatic non-small cell lung cancer, squamous cell carcinoma. She is currently undergoing systemic chemotherapy with carboplatin and Abraxane and tolerating it fairly well. She denied having any significant adverse effect from the chemotherapy so far. I recommended for the patient to proceed with her treatment today as scheduled. She would come back for followup visit in 2 weeks for reevaluation. I advised her to call immediately if she has any concerning symptoms in the interval.  All questions were answered. The patient knows to call the clinic with any problems, questions or concerns. We can certainly see the patient much sooner if necessary.  I spent 15 minutes counseling the patient face to face. The total time spent in the appointment was 25 minutes.

## 2013-02-23 NOTE — Patient Instructions (Signed)
[facility] Discharge Instructions for Patients Receiving Chemotherapy  Today you received the following chemotherapy agents: abraxane  To help prevent nausea and vomiting after your treatment, we encourage you to take your nausea medication.  Take it as often as prescribed.     If you develop nausea and vomiting that is not controlled by your nausea medication, call the clinic. If it is after clinic hours your family physician or the after hours number for the clinic or go to the Emergency Department.   BELOW ARE SYMPTOMS THAT SHOULD BE REPORTED IMMEDIATELY:  *FEVER GREATER THAN 100.5 F  *CHILLS WITH OR WITHOUT FEVER  NAUSEA AND VOMITING THAT IS NOT CONTROLLED WITH YOUR NAUSEA MEDICATION  *UNUSUAL SHORTNESS OF BREATH  *UNUSUAL BRUISING OR BLEEDING  TENDERNESS IN MOUTH AND THROAT WITH OR WITHOUT PRESENCE OF ULCERS  *URINARY PROBLEMS  *BOWEL PROBLEMS  UNUSUAL RASH Items with * indicate a potential emergency and should be followed up as soon as possible.  Feel free to call the clinic you have any questions or concerns. The clinic phone number is [phone].   I have been informed and understand all the instructions given to me. I know to contact the clinic, my physician, or go to the Emergency Department if any problems should occur. I do not have any questions at this time, but understand that I may call the clinic during office hours   should I have any questions or need assistance in obtaining follow up care.    __________________________________________  _____________  __________ Signature of Patient or Authorized Representative            Date                   Time    __________________________________________ Nurse's Signature   

## 2013-02-27 ENCOUNTER — Telehealth: Payer: Self-pay | Admitting: *Deleted

## 2013-02-27 NOTE — Telephone Encounter (Signed)
sw the pt gv appt for a lab@ 12noon and tx@12 :30pm on 03/02/13. i also made the pt aware of her other appts that's scheduled for the next couple of Thursdays. Pt will get a print out of her schedule when she comes in on 03/02/13...td

## 2013-03-02 ENCOUNTER — Other Ambulatory Visit (HOSPITAL_BASED_OUTPATIENT_CLINIC_OR_DEPARTMENT_OTHER): Payer: BC Managed Care – PPO | Admitting: Lab

## 2013-03-02 ENCOUNTER — Ambulatory Visit (HOSPITAL_BASED_OUTPATIENT_CLINIC_OR_DEPARTMENT_OTHER): Payer: BC Managed Care – PPO

## 2013-03-02 VITALS — BP 134/71 | HR 65 | Temp 98.7°F | Resp 20

## 2013-03-02 DIAGNOSIS — C3491 Malignant neoplasm of unspecified part of right bronchus or lung: Secondary | ICD-10-CM

## 2013-03-02 DIAGNOSIS — C3490 Malignant neoplasm of unspecified part of unspecified bronchus or lung: Secondary | ICD-10-CM

## 2013-03-02 DIAGNOSIS — Z5111 Encounter for antineoplastic chemotherapy: Secondary | ICD-10-CM

## 2013-03-02 LAB — CBC WITH DIFFERENTIAL/PLATELET
BASO%: 0.6 % (ref 0.0–2.0)
EOS%: 0.3 % (ref 0.0–7.0)
LYMPH%: 15.9 % (ref 14.0–49.7)
MCH: 27.4 pg (ref 25.1–34.0)
MCHC: 33.3 g/dL (ref 31.5–36.0)
MONO#: 0.8 10*3/uL (ref 0.1–0.9)
Platelets: 248 10*3/uL (ref 145–400)
RBC: 3.61 10*6/uL — ABNORMAL LOW (ref 3.70–5.45)
WBC: 6.2 10*3/uL (ref 3.9–10.3)
nRBC: 0 % (ref 0–0)

## 2013-03-02 LAB — COMPREHENSIVE METABOLIC PANEL (CC13)
ALT: 35 U/L (ref 0–55)
AST: 23 U/L (ref 5–34)
Alkaline Phosphatase: 125 U/L (ref 40–150)
BUN: 14.5 mg/dL (ref 7.0–26.0)
Calcium: 9.2 mg/dL (ref 8.4–10.4)
Chloride: 98 mEq/L (ref 98–109)
Creatinine: 0.8 mg/dL (ref 0.6–1.1)
Total Bilirubin: <0.2 mg/dL (ref 0.20–1.20)

## 2013-03-02 MED ORDER — SODIUM CHLORIDE 0.9 % IJ SOLN
10.0000 mL | INTRAMUSCULAR | Status: DC | PRN
  Administered 2013-03-02: 10 mL
  Filled 2013-03-02: qty 10

## 2013-03-02 MED ORDER — PACLITAXEL PROTEIN-BOUND CHEMO INJECTION 100 MG
100.0000 mg/m2 | Freq: Once | INTRAVENOUS | Status: AC
  Administered 2013-03-02: 200 mg via INTRAVENOUS
  Filled 2013-03-02: qty 40

## 2013-03-02 MED ORDER — SODIUM CHLORIDE 0.9 % IV SOLN
Freq: Once | INTRAVENOUS | Status: AC
  Administered 2013-03-02: 13:00:00 via INTRAVENOUS

## 2013-03-02 MED ORDER — HEPARIN SOD (PORK) LOCK FLUSH 100 UNIT/ML IV SOLN
500.0000 [IU] | Freq: Once | INTRAVENOUS | Status: AC | PRN
  Administered 2013-03-02: 500 [IU]
  Filled 2013-03-02: qty 5

## 2013-03-02 MED ORDER — SODIUM CHLORIDE 0.9 % IV SOLN
750.0000 mg | Freq: Once | INTRAVENOUS | Status: AC
  Administered 2013-03-02: 750 mg via INTRAVENOUS
  Filled 2013-03-02: qty 75

## 2013-03-02 MED ORDER — DEXAMETHASONE SODIUM PHOSPHATE 20 MG/5ML IJ SOLN
20.0000 mg | Freq: Once | INTRAMUSCULAR | Status: AC
  Administered 2013-03-02: 20 mg via INTRAVENOUS

## 2013-03-02 MED ORDER — ONDANSETRON 16 MG/50ML IVPB (CHCC)
16.0000 mg | Freq: Once | INTRAVENOUS | Status: AC
  Administered 2013-03-02: 16 mg via INTRAVENOUS

## 2013-03-02 NOTE — Patient Instructions (Addendum)
[facility] Discharge Instructions for Patients Receiving Chemotherapy  Today you received the following chemotherapy agents Abraxane and Carboplatin.   To help prevent nausea and vomiting after your treatment, we encourage you to take your nausea medication.   If you develop nausea and vomiting that is not controlled by your nausea medication, call the clinic.   BELOW ARE SYMPTOMS THAT SHOULD BE REPORTED IMMEDIATELY:  *FEVER GREATER THAN 100.5 F  *CHILLS WITH OR WITHOUT FEVER  NAUSEA AND VOMITING THAT IS NOT CONTROLLED WITH YOUR NAUSEA MEDICATION  *UNUSUAL SHORTNESS OF BREATH  *UNUSUAL BRUISING OR BLEEDING  TENDERNESS IN MOUTH AND THROAT WITH OR WITHOUT PRESENCE OF ULCERS  *URINARY PROBLEMS  *BOWEL PROBLEMS  UNUSUAL RASH Items with * indicate a potential emergency and should be followed up as soon as possible.  Feel free to call the clinic you have any questions or concerns. The clinic phone number is [phone].    

## 2013-03-04 ENCOUNTER — Other Ambulatory Visit: Payer: Self-pay | Admitting: Internal Medicine

## 2013-03-07 ENCOUNTER — Telehealth: Payer: Self-pay | Admitting: *Deleted

## 2013-03-07 MED ORDER — PROCHLORPERAZINE 25 MG RE SUPP: 25.0000 mg | Freq: Two times a day (BID) | RECTAL | Status: AC | PRN

## 2013-03-07 NOTE — Telephone Encounter (Signed)
Pt called requesting an antiemetic suppository.  Per dr Donnald Garre, okay to call one in  SLJ

## 2013-03-07 NOTE — Telephone Encounter (Signed)
Late entry 03/06/13--pt called left msg stating that she has a rash on the top of her hand.  Called pt back, no answer, left msg to call back.  Per Dr Jodi Shepherd, she would just need to keep watching the rash and call if anything changes or gets worse.  SLJ

## 2013-03-07 NOTE — Telephone Encounter (Signed)
Spoke with pt, she stated that she verbalized understanding regarding just watching the rash and is to call is anything changes.  SLJ

## 2013-03-09 ENCOUNTER — Ambulatory Visit (HOSPITAL_BASED_OUTPATIENT_CLINIC_OR_DEPARTMENT_OTHER): Payer: BC Managed Care – PPO

## 2013-03-09 ENCOUNTER — Ambulatory Visit (HOSPITAL_BASED_OUTPATIENT_CLINIC_OR_DEPARTMENT_OTHER): Payer: BC Managed Care – PPO | Admitting: Physician Assistant

## 2013-03-09 ENCOUNTER — Encounter: Payer: Self-pay | Admitting: Physician Assistant

## 2013-03-09 ENCOUNTER — Telehealth: Payer: Self-pay | Admitting: Internal Medicine

## 2013-03-09 ENCOUNTER — Ambulatory Visit: Payer: BC Managed Care – PPO | Admitting: Internal Medicine

## 2013-03-09 ENCOUNTER — Other Ambulatory Visit (HOSPITAL_BASED_OUTPATIENT_CLINIC_OR_DEPARTMENT_OTHER): Payer: BC Managed Care – PPO | Admitting: Lab

## 2013-03-09 ENCOUNTER — Telehealth: Payer: Self-pay | Admitting: *Deleted

## 2013-03-09 VITALS — BP 113/63 | HR 56 | Temp 97.5°F | Resp 18 | Ht 65.0 in | Wt 152.4 lb

## 2013-03-09 DIAGNOSIS — C3491 Malignant neoplasm of unspecified part of right bronchus or lung: Secondary | ICD-10-CM

## 2013-03-09 DIAGNOSIS — C3490 Malignant neoplasm of unspecified part of unspecified bronchus or lung: Secondary | ICD-10-CM

## 2013-03-09 DIAGNOSIS — C770 Secondary and unspecified malignant neoplasm of lymph nodes of head, face and neck: Secondary | ICD-10-CM

## 2013-03-09 DIAGNOSIS — Z5111 Encounter for antineoplastic chemotherapy: Secondary | ICD-10-CM

## 2013-03-09 DIAGNOSIS — M25559 Pain in unspecified hip: Secondary | ICD-10-CM

## 2013-03-09 DIAGNOSIS — L989 Disorder of the skin and subcutaneous tissue, unspecified: Secondary | ICD-10-CM

## 2013-03-09 LAB — CBC WITH DIFFERENTIAL/PLATELET
BASO%: 0.6 % (ref 0.0–2.0)
HCT: 28.7 % — ABNORMAL LOW (ref 34.8–46.6)
LYMPH%: 19.1 % (ref 14.0–49.7)
MCHC: 32.8 g/dL (ref 31.5–36.0)
MCV: 81.8 fL (ref 79.5–101.0)
MONO#: 0.5 10*3/uL (ref 0.1–0.9)
MONO%: 10.8 % (ref 0.0–14.0)
NEUT%: 68.9 % (ref 38.4–76.8)
Platelets: 185 10*3/uL (ref 145–400)
RBC: 3.51 10*6/uL — ABNORMAL LOW (ref 3.70–5.45)

## 2013-03-09 LAB — COMPREHENSIVE METABOLIC PANEL (CC13)
ALT: 34 U/L (ref 0–55)
Alkaline Phosphatase: 122 U/L (ref 40–150)
CO2: 25 mEq/L (ref 22–29)
Creatinine: 0.8 mg/dL (ref 0.6–1.1)
Glucose: 93 mg/dl (ref 70–140)
Sodium: 133 mEq/L — ABNORMAL LOW (ref 136–145)
Total Bilirubin: <0.2 mg/dL (ref 0.20–1.20)
Total Protein: 7.2 g/dL (ref 6.4–8.3)

## 2013-03-09 MED ORDER — SODIUM CHLORIDE 0.9 % IJ SOLN
10.0000 mL | INTRAMUSCULAR | Status: DC | PRN
  Administered 2013-03-09: 10 mL
  Filled 2013-03-09: qty 10

## 2013-03-09 MED ORDER — HEPARIN SOD (PORK) LOCK FLUSH 100 UNIT/ML IV SOLN
500.0000 [IU] | Freq: Once | INTRAVENOUS | Status: AC | PRN
  Administered 2013-03-09: 500 [IU]
  Filled 2013-03-09: qty 5

## 2013-03-09 MED ORDER — SODIUM CHLORIDE 0.9 % IV SOLN
Freq: Once | INTRAVENOUS | Status: AC
  Administered 2013-03-09: 15:00:00 via INTRAVENOUS

## 2013-03-09 MED ORDER — DEXAMETHASONE SODIUM PHOSPHATE 10 MG/ML IJ SOLN
10.0000 mg | Freq: Once | INTRAMUSCULAR | Status: AC
  Administered 2013-03-09: 10 mg via INTRAVENOUS

## 2013-03-09 MED ORDER — PACLITAXEL PROTEIN-BOUND CHEMO INJECTION 100 MG
100.0000 mg/m2 | Freq: Once | INTRAVENOUS | Status: AC
  Administered 2013-03-09: 175 mg via INTRAVENOUS
  Filled 2013-03-09: qty 35

## 2013-03-09 MED ORDER — ONDANSETRON 8 MG/50ML IVPB (CHCC)
8.0000 mg | Freq: Once | INTRAVENOUS | Status: AC
  Administered 2013-03-09: 8 mg via INTRAVENOUS

## 2013-03-09 NOTE — Patient Instructions (Addendum)
Continue weekly labs and chemotherapy as scheduled Follow-up in 2 weeks 

## 2013-03-09 NOTE — Telephone Encounter (Signed)
Per staff message and POF I have scheduled appts.  JMW  

## 2013-03-09 NOTE — Patient Instructions (Signed)
[facility] Discharge Instructions for Patients Receiving Chemotherapy  Today you received the following chemotherapy agent; Abraxane.  To help prevent nausea and vomiting after your treatment, we encourage you to take your nausea medication.   If you develop nausea and vomiting that is not controlled by your nausea medication, call the clinic.   BELOW ARE SYMPTOMS THAT SHOULD BE REPORTED IMMEDIATELY:  *FEVER GREATER THAN 100.5 F  *CHILLS WITH OR WITHOUT FEVER  NAUSEA AND VOMITING THAT IS NOT CONTROLLED WITH YOUR NAUSEA MEDICATION  *UNUSUAL SHORTNESS OF BREATH  *UNUSUAL BRUISING OR BLEEDING  TENDERNESS IN MOUTH AND THROAT WITH OR WITHOUT PRESENCE OF ULCERS  *URINARY PROBLEMS  *BOWEL PROBLEMS  UNUSUAL RASH Items with * indicate a potential emergency and should be followed up as soon as possible.  Feel free to call the clinic you have any questions or concerns. The clinic phone number is [phone].    

## 2013-03-09 NOTE — Telephone Encounter (Signed)
Gave pt appt for July lab,ML and chemo, called Marcelino Duster regarding chemo for August and 7/31

## 2013-03-09 NOTE — Telephone Encounter (Signed)
gave pt appt for lab, MD and chemo pt requesting all appts to be afternoon, notfied Marcelino Duster

## 2013-03-10 ENCOUNTER — Emergency Department (HOSPITAL_COMMUNITY)
Admission: EM | Admit: 2013-03-10 | Discharge: 2013-03-10 | Disposition: A | Discharge status: Home / Self Care | Payer: BC Managed Care – PPO | Attending: Emergency Medicine | Admitting: Emergency Medicine

## 2013-03-10 ENCOUNTER — Emergency Department (HOSPITAL_COMMUNITY): Payer: BC Managed Care – PPO

## 2013-03-10 ENCOUNTER — Encounter (HOSPITAL_COMMUNITY): Payer: Self-pay | Admitting: *Deleted

## 2013-03-10 DIAGNOSIS — J441 Chronic obstructive pulmonary disease with (acute) exacerbation: Secondary | ICD-10-CM | POA: Insufficient documentation

## 2013-03-10 DIAGNOSIS — F3289 Other specified depressive episodes: Secondary | ICD-10-CM | POA: Insufficient documentation

## 2013-03-10 DIAGNOSIS — R300 Dysuria: Secondary | ICD-10-CM | POA: Insufficient documentation

## 2013-03-10 DIAGNOSIS — R410 Disorientation, unspecified: Secondary | ICD-10-CM

## 2013-03-10 DIAGNOSIS — R110 Nausea: Secondary | ICD-10-CM | POA: Insufficient documentation

## 2013-03-10 DIAGNOSIS — Z87891 Personal history of nicotine dependence: Secondary | ICD-10-CM | POA: Insufficient documentation

## 2013-03-10 DIAGNOSIS — F411 Generalized anxiety disorder: Secondary | ICD-10-CM | POA: Insufficient documentation

## 2013-03-10 DIAGNOSIS — R109 Unspecified abdominal pain: Secondary | ICD-10-CM | POA: Insufficient documentation

## 2013-03-10 DIAGNOSIS — Z85118 Personal history of other malignant neoplasm of bronchus and lung: Secondary | ICD-10-CM | POA: Insufficient documentation

## 2013-03-10 DIAGNOSIS — E039 Hypothyroidism, unspecified: Secondary | ICD-10-CM | POA: Insufficient documentation

## 2013-03-10 DIAGNOSIS — Z8739 Personal history of other diseases of the musculoskeletal system and connective tissue: Secondary | ICD-10-CM | POA: Insufficient documentation

## 2013-03-10 DIAGNOSIS — R4182 Altered mental status, unspecified: Secondary | ICD-10-CM | POA: Insufficient documentation

## 2013-03-10 DIAGNOSIS — F329 Major depressive disorder, single episode, unspecified: Secondary | ICD-10-CM | POA: Insufficient documentation

## 2013-03-10 DIAGNOSIS — Z79899 Other long term (current) drug therapy: Secondary | ICD-10-CM | POA: Insufficient documentation

## 2013-03-10 DIAGNOSIS — Z90710 Acquired absence of both cervix and uterus: Secondary | ICD-10-CM | POA: Insufficient documentation

## 2013-03-10 DIAGNOSIS — Z8719 Personal history of other diseases of the digestive system: Secondary | ICD-10-CM | POA: Insufficient documentation

## 2013-03-10 DIAGNOSIS — K219 Gastro-esophageal reflux disease without esophagitis: Secondary | ICD-10-CM | POA: Insufficient documentation

## 2013-03-10 DIAGNOSIS — I10 Essential (primary) hypertension: Secondary | ICD-10-CM | POA: Insufficient documentation

## 2013-03-10 DIAGNOSIS — F29 Unspecified psychosis not due to a substance or known physiological condition: Secondary | ICD-10-CM | POA: Insufficient documentation

## 2013-03-10 LAB — COMPREHENSIVE METABOLIC PANEL
ALT: 27 U/L (ref 0–35)
Alkaline Phosphatase: 110 U/L (ref 39–117)
BUN: 17 mg/dL (ref 6–23)
CO2: 28 mEq/L (ref 19–32)
Chloride: 95 mEq/L — ABNORMAL LOW (ref 96–112)
GFR calc Af Amer: >90 mL/min (ref >90)
Glucose, Bld: 120 mg/dL — ABNORMAL HIGH (ref 70–99)
Potassium: 3.8 mEq/L (ref 3.5–5.1)
Sodium: 134 mEq/L — ABNORMAL LOW (ref 135–145)
Total Bilirubin: 0.1 mg/dL — ABNORMAL LOW (ref 0.3–1.2)
Total Protein: 7 g/dL (ref 6.0–8.3)

## 2013-03-10 LAB — CBC WITH DIFFERENTIAL/PLATELET
Eosinophils Absolute: 0 10*3/uL (ref 0.0–0.7)
Hemoglobin: 8.7 g/dL — ABNORMAL LOW (ref 12.0–15.0)
Lymphocytes Relative: 20 % (ref 12–46)
Lymphs Abs: 0.8 10*3/uL (ref 0.7–4.0)
MCH: 27.4 pg (ref 26.0–34.0)
Monocytes Relative: 9 % (ref 3–12)
Neutro Abs: 2.9 10*3/uL (ref 1.7–7.7)
Neutrophils Relative %: 71 % (ref 43–77)
Platelets: 180 10*3/uL (ref 150–400)
RBC: 3.18 MIL/uL — ABNORMAL LOW (ref 3.87–5.11)
WBC: 4.1 10*3/uL (ref 4.0–10.5)

## 2013-03-10 LAB — URINALYSIS, ROUTINE W REFLEX MICROSCOPIC
Bilirubin Urine: NEGATIVE
Nitrite: NEGATIVE
Specific Gravity, Urine: 1.027 (ref 1.005–1.030)
Urobilinogen, UA: 0.2 mg/dL (ref 0.0–1.0)
pH: 5 (ref 5.0–8.0)

## 2013-03-10 LAB — TROPONIN I: Troponin I: <0.3 ng/mL (ref <0.30)

## 2013-03-10 MED ORDER — HEPARIN SOD (PORK) LOCK FLUSH 100 UNIT/ML IV SOLN
INTRAVENOUS | Status: AC
  Filled 2013-03-10: qty 5

## 2013-03-10 NOTE — ED Notes (Signed)
Patient transported to X-ray 

## 2013-03-10 NOTE — ED Notes (Signed)
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 Pt is to wear 2L [place] [employer] continuously. Reports SOB starting this morning, family reports pt was confused and altered this am. At present family reports pt is at baseline, alert and oriented x4. Pt also reports decreased urine x 1 week, cancer center recommended pt come in and be evaluated due to possibility pt not excreting chemo since decreased urine output. Pt also has dysuria, 3/10.

## 2013-03-10 NOTE — Progress Notes (Addendum)
[facility] [phone]   [phone]  OFFICE PROGRESS NOTE  WHITLOW, [name], MD [address] [place] [address]  DIAGNOSIS: Stage IV non-small cell lung cancer, squamous cell carcinoma diagnosed in June of 2014.  PRIOR THERAPY: None  CURRENT THERAPY: Systemic chemotherapy with carboplatin for AUC of 5 on day 1 and Abraxane 100 mg/M2 on days 1, 8 and 15 every 3 weeks. Status post 1 cycle  INTERVAL HISTORY: Jodi Shepherd 52 y.o. female returns to the clinic today for followup visit accompanied her husband. The patient tolerated the first dose of her systemic chemotherapy fairly well with no significant adverse effects except for mild nausea results with Compazine. She reports that the nausea and vomiting is primarily in the mornings. She is concerned about a rash that is on her hands. She also notes a dark lesion on her left forearm. She does not recall how long the lesion has been present. She complains of significantly decreased energy level as well as decreased appetite. She reports that she is drinking 4-5 L of fluid daily but doesn't feel as though her urine output matches her fluid intake. She has had some constipation alternating with some diarrhea,, mostly diarrhea recently and feels that perhaps she is losing some of her volume intake in that manner. She complains of not sleeping well, stating that it hurts to lay on her hips and this appears to be a new problem. She still occasionally gets headaches but is  is using her pain medications or her oxygen on a as needed basis. She denied having any night sweats. The patient denied having any chest pain, shortness of breath, cough or hemoptysis. She feels the lymph nodes in the supraclavicular area are continuing to get smaller. She is here today to start day 8 of the cycle 2 of her chemotherapy.  MEDICAL HISTORY: Past Medical History  Diagnosis Date  . Hypertension   . Hypothyroidism   . Anxiety   .  Depression   . GERD (gastroesophageal reflux disease)   . Cancer     non- small cell squamous  . Shortness of breath   . COPD (chronic obstructive pulmonary disease)   . Headache(784.0)   . Arthritis     Hx; of osteoarthritis  . Diverticulosis     Hx: of    ALLERGIES:  has No Known Allergies.  MEDICATIONS:  Current Outpatient Prescriptions  Medication Sig Dispense Refill  . oxycodone (ROXICODONE) 30 MG immediate release tablet Take 30 mg by mouth every 4 (four) hours as needed for pain.      Marland Kitchen ALPRAZolam (XANAX) 0.25 MG tablet Take 1 tablet (0.25 mg total) by mouth 3 (three) times daily as needed for anxiety.  30 tablet  0  . ascorbic acid (VITAMIN C) 1000 MG tablet Take 1,000 mg by mouth daily.      Marland Kitchen atenolol (TENORMIN) 25 MG tablet Take 75 mg by mouth 2 (two) times daily.      . Calcium-Magnesium-Vitamin D (CALCIUM 1200+D3) 600-40-500 MG-MG-UNIT TB24 Take 1 tablet by mouth daily.      . carisoprodol (SOMA) 350 MG tablet Take 350 mg by mouth 4 (four) times daily as needed for muscle spasms.      . Coenzyme Q10 (CO Q 10) 100 MG CAPS Take 1 capsule by mouth daily.      Marland Kitchen docusate sodium (COLACE) 100 MG capsule Take 100 mg by mouth as needed for constipation.      Marland Kitchen  esomeprazole (NEXIUM) 40 MG capsule Take 40 mg by mouth every other day.       . estradiol (ESTRACE) 2 MG tablet Take 2 mg by mouth daily.      . fexofenadine (ALLEGRA) 180 MG tablet Take 180 mg by mouth daily as needed (alleriges).       Marland Kitchen FLUoxetine (PROZAC) 20 MG capsule Take 20 mg by mouth daily.       . fluticasone (FLONASE) 50 MCG/ACT nasal spray Place 2 sprays into the nose 2 (two) times daily.      Marland Kitchen lamoTRIgine (LAMICTAL) 100 MG tablet Take 100 mg by mouth 2 (two) times daily.       Marland Kitchen levothyroxine (SYNTHROID, LEVOTHROID) 50 MCG tablet Take 50 mcg by mouth daily before breakfast.      . lidocaine-prilocaine (EMLA) cream Apply 1 application topically as needed (to port site).      Marland Kitchen lisinopril (PRINIVIL,ZESTRIL)  30 MG tablet Take 30 mg by mouth daily.      . Multiple Vitamin (MULTIVITAMIN) tablet Take 1 tablet by mouth daily.      . NONFORMULARY OR COMPOUNDED ITEM Apply 1 application topically 3 (three) times daily as needed (for pain). Special compounded cream from [facility] in [place], [place] ; contains ketamine, ketoprofen, amitriptyline.      Marland Kitchen oxybutynin (DITROPAN-XL) 10 MG 24 hr tablet Take 10 mg by mouth at bedtime.      Marland Kitchen PROAIR HFA 108 (90 BASE) MCG/ACT inhaler Inhale 2 puffs into the lungs every 4 (four) hours as needed for wheezing or shortness of breath.       . prochlorperazine (COMPAZINE) 10 MG tablet Take 10 mg by mouth every 6 (six) hours as needed (nausea).      . prochlorperazine (COMPAZINE) 25 MG suppository Place 1 suppository (25 mg total) rectally every 12 (twelve) hours as needed for nausea.  12 suppository  0  . simvastatin (ZOCOR) 40 MG tablet Take 40 mg by mouth every evening.       No current facility-administered medications for this visit.    SURGICAL HISTORY:  Past Surgical History  Procedure Laterality Date  . Abdominal hysterectomy    . Inner ear surgery    . Right -sided thoracic outlet surgery      Hx: of  . Colonoscopy w/ biopsies and polypectomy      Hx: of  . Rectal fistula repair      Hx: of  . Hemorrhoid surgery      Hx: of  . Portacath placement Bilateral 02/16/2013    Procedure: INSERTION PORT-A-CATH;  Surgeon: Kerin Perna, MD;  Location: [facility] OR;  Service: Thoracic;  Laterality: Bilateral;    REVIEW OF SYSTEMS:  Pertinent items are noted in HPI.   PHYSICAL EXAMINATION: General appearance: alert, cooperative, fatigued and no distress Head: Normocephalic, without obvious abnormality, atraumatic Neck: no adenopathy Lymph nodes: Cervical, supraclavicular, and axillary nodes normal. Resp: clear to auscultation bilaterally Cardio: regular rate and rhythm, S1, S2 normal, no murmur, click, rub or gallop GI: soft, non-tender; bowel sounds normal; no  masses,  no organomegaly Extremities: extremities normal, atraumatic, no cyanosis or edema Neurologic: Alert and oriented X 3, normal strength and tone. Normal symmetric reflexes. Normal coordination and gait Skin: Reveals generally dry skin on the hands with some punctate areas of eschar but no active bleeding or evidence of infection. On the flexor surface of the left forearm there is an approximately 3-4 mm dark lesion with some slight variation in  the darkness.  ECOG PERFORMANCE STATUS: 1 - Symptomatic but completely ambulatory  Blood pressure 113/63, pulse 56, temperature 97.5 F (36.4 C), temperature source Oral, resp. rate 18, height 5\' 5"  (1.651 m), weight 152 lb 6.4 oz (69.128 kg).  LABORATORY DATA: Lab Results  Component Value Date   WBC 4.8 03/09/2013   HGB 9.4* 03/09/2013   HCT 28.7* 03/09/2013   MCV 81.8 03/09/2013   PLT 185 03/09/2013      Chemistry      Component Value Date/Time   NA 133* 03/09/2013 1218   NA 131* 02/16/2013 0953   K 4.6 03/09/2013 1218   K 3.9 02/16/2013 0953   CL 92* 02/16/2013 0953   CL 96* 02/09/2013 1131   CO2 25 03/09/2013 1218   CO2 26 02/16/2013 0953   BUN 21.5 03/09/2013 1218   BUN 13 02/16/2013 0953   CREATININE 0.8 03/09/2013 1218   CREATININE 0.57 02/16/2013 0953      Component Value Date/Time   CALCIUM 9.4 03/09/2013 1218   CALCIUM 9.2 02/16/2013 0953   ALKPHOS 122 03/09/2013 1218   ALKPHOS 126* 02/16/2013 0953   AST 22 03/09/2013 1218   AST 27 02/16/2013 0953   ALT 34 03/09/2013 1218   ALT 30 02/16/2013 0953   BILITOT <0.20 Repeated and Verified 03/09/2013 1218   BILITOT 0.3 02/16/2013 0953       RADIOGRAPHIC STUDIES: Dg Chest 2 View Within Previous 72 Hours.  Films Obtained On Friday Are Acceptable For Monday And Tuesday Cases  02/16/2013   *RADIOLOGY REPORT*  Clinical Data: Preop, history of lung carcinoma  CHEST - 2 VIEW  Comparison: PET CT of 02/02/2013 and chest x-ray of 01/24/2013  Findings: There has been interval response to therapy with  decrease in mediastinal adenopathy and decrease in pleural soft tissue masses.  Bilateral pleural effusions remain left larger than right. Mild cardiomegaly is stable.  No acute bony abnormalities seen  IMPRESSION:  1.  Interval response to therapy with some improvement in mediastinal adenopathy as well as decrease in pleural metastatic lesions. 2.  Bilateral pleural effusions left larger than right.   Original Report Authenticated By: Dwyane Dee, M.D.   Dg Chest 2 View  01/24/2013   *RADIOLOGY REPORT*  Clinical Data: Follow-up pleural effusion.  Shortness of breath, cough, hypertension.  History of smoking, lung cancer.  CHEST - 2 VIEW  Comparison: 01/15/2013 chest x-ray and chest CT 01/14/2013  Findings: There are bilateral pleural effusions, similar in quantity compared to prior study.  Probable loculated component is identified laterally at the right lung apex.  Bulky mediastinal and hilar adenopathy are noted, as seen on prior chest CT.  There is patchy density at the lung bases, consistent with atelectasis / consolidation.  The heart is enlarged.  No pulmonary edema.  IMPRESSION:  1.  Cardiomegaly without pulmonary edema. 2.  Bilateral pleural effusions with loculated component seen in the right lung apex. 3.  Bulky mediastinal and hilar adenopathy.   Original Report Authenticated By: Norva Pavlov, M.D.   Mr Laqueta Jean Wo Contrast  02/02/2013   *RADIOLOGY REPORT*  Clinical Data: 52 year old female with advanced thoracic malignancy, metastatic carcinoma most suggestive of non-small cell carcinoma on the left supraclavicular lymph node biopsy. Staging.  MRI HEAD WITHOUT AND WITH CONTRAST  Technique:  Multiplanar, multiecho pulse sequences of the brain and surrounding structures were obtained according to standard protocol without and with intravenous contrast  Contrast: 15mL MULTIHANCE GADOBENATE DIMEGLUMINE 529 MG/ML IV SOLN  Comparison: Chest  CT 01/14/2013.  Findings: No abnormal enhancement identified.   No midline shift, mass effect, or evidence of mass lesion.  No abnormal diffusion signal. No restricted diffusion to suggest acute infarction.  No acute intracranial hemorrhage identified. Normal cerebral volume.  No ventriculomegaly.  Negative pituitary, cervicomedullary junction and visualized cervical spine.  Skull and visualized upper cervical spine bone marrow signal is within normal limits.  No dural thickening identified.  Small chronic lacunar infarct right caudate nucleus.  Otherwise minimal cerebral white matter T2 and FLAIR hyperintensity.  Deep gray matter nuclei, brainstem and cerebellum within normal limits. Major intracranial vascular flow voids are preserved.  Mastoids are clear.  Grossly normal visualized internal auditory structures.  Paranasal sinuses are clear. Visualized orbit soft tissues are within normal limits.  Negative scalp soft tissues. Previously seen lower cervical lymph node enlargement not included on these images.  IMPRESSION: 1. No acute or metastatic intracranial abnormality. 2.  Minimal to mild chronic small vessel ischemia.   Original Report Authenticated By: Erskine Speed, M.D.   Nm Pet Image Initial (pi) Skull Base To Thigh  02/02/2013   *RADIOLOGY REPORT*  Clinical Data: Initial treatment strategy for lung cancer. Staging scan.  NUCLEAR MEDICINE PET SKULL BASE TO THIGH  Fasting Blood Glucose:  95  Technique:  19 mCi F-18 FDG was injected intravenously. CT data was obtained and used for attenuation correction and anatomic localization only.  (This was not acquired as a diagnostic CT examination.) Additional exam technical data entered on technologist worksheet.  Comparison:  Chest CT 01/14/2013.  Findings:  Neck: A small focus of hypermetabolic activity in the left side of the mandible (SUVmax = 6.8)favored to represent underlying dental disease.  Extensive bilateral supraclavicular lymphadenopathy which is markedly hypermetabolic (SUVmax = 45.6 - 54.0), measuring up to 2.2 x  3.5 cm in the right supraclavicular station.  Chest:  Innumerable enlarged and hypermetabolic lymph nodes throughout the mediastinum and bilateral hilar regions (SUVmax = 56.0), measuring up to 4.1 x 3.5 cm and 4.0 x 3.3 cm in the high and low right paratracheal stations respectively.  A subcarinal nodal mass is present measuring up to 3.0 x 6.4 cm (SUVmax = 42.3). No definite intraparenchymal nodule or mass is confidently identified within the lungs.  However, there is extensive pleural nodularity which is hypermetabolic (SUVmax = 17.3 - 26.5). Moderate bilateral malignant pleural effusions are noted with associated passive atelectasis in portions of the lungs bilaterally.  Thickening of the peribronchovascular interstitium is noted, most pronounced in the lower lobes of the lungs bilaterally. Moderate centrilobular and paraseptal emphysema. Heart size is normal. There is no significant pericardial fluid, thickening or pericardial calcification.  The esophagus is displaced by subcarinal nodal mass, but is otherwise unremarkable in appearance.  Abdomen/Pelvis:  Extensive hypermetabolic activity throughout the upper abdomen and retroperitoneum, with numerous enlarged lymph nodes throughout these regions, measuring up to 18 x 25 mm in the gastrohepatic ligament, with a malignant range metabolic activity (SUVmax = 43.0).  No hypermetabolic activity or focal lesion identified within the liver, pancreas, spleen, left adrenal gland or either kidney.  In the lateral limb of the right adrenal gland there is a 2.0 x 0.7 cm intermediate attenuation nodule (39 HU) which demonstrates some low level of metabolic activity (SUVmax = 3.7), which could represent a metastatic lesion.  Retroperitoneal lymphadenopathy extends into the immediate infrarenal region, but does not extend into the anatomic pelvis.  No significant volume of ascites.  No pneumoperitoneum.  No pathologic distension of  small bowel.  Status post hysterectomy.   Ovaries are unremarkable in appearance.  The bladder is normal in appearance.  Skeleton:  No focal hypermetabolic activity to suggest skeletal metastasis.  IMPRESSION: 1.  Extensive metastatic disease throughout the thorax and upper abdomen, as above.  An exact primary lesion in the lung is not confidently identified on today's examination, although any one of these pleural based hypermetabolic nodules can certainly be a primary lung lesion, this is not strongly favored.  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 2.  Potential small right adrenal metastasis also noted. 3.  Bilateral malignant pleural effusions. 4.  Additional incidental findings, as above.   Original Report Authenticated By: Trudie Reed, M.D.   US Biopsy  01/26/2013   *RADIOLOGY REPORT*  Clinical history:52 year old with extensive is neck and thoracic lymphadenopathy.  No malignant cells identified on pleural fluid cytology.  Definitive diagnosis is needed.  PROCEDURE(S): ULTRASOUND GUIDED CORE BIOPSIES OF A LEFT SUPRACLAVICULAR LYMPH NODE  Physician: Rachelle Hora. Henn, MD  Medications:None  Moderate sedation time:None  Fluoroscopy time: None  Procedure:The procedure was explained to the patient.  The risks and benefits of the procedure were discussed and the patient's questions were addressed.  Informed consent was obtained from the patient.  The neck was evaluated with ultrasound.  Multiple large lymph nodes were identified on both sides of the neck.  A left supraclavicular lymph node was targeted for biopsy.  The left side of the neck was prepped and draped in a sterile fashion.  The skin was anesthetized with 1% lidocaine.  Using ultrasound guidance, four core biopsies were obtained with an 18 gauge core device. Samples placed in saline.  Findings:Multiple enlarged hypoechoic lymph nodes on both sides of  the neck.  Needle position confirmed within the left supraclavicular lymph node.  Complications: None  Impression:Ultrasound guided core biopsies of a left supraclavicular lymph node.   Original Report Authenticated By: Richarda Overlie, M.D.   Dg Chest Port 1 View  02/16/2013   *RADIOLOGY REPORT*  Clinical Data: History of lung cancer, post porta-a-catheter placement  PORTABLE CHEST - 1 VIEW  Comparison: 02/16/2013; PET CT - 02/03/2023  Findings:  Grossly unchanged enlarged cardiac silhouette and mediastinal contours with nodular prominence of the mediastinum compatible with known bulky hilar lymphadenopathy.  Interval placement of a left anterior chest wall subclavian vein approach port catheter with tip terminating over the mid SVC.  No pneumothorax.  Grossly unchanged partially loculated small bilateral effusions and associated bibasilar opacities, left greater than right.  There is grossly unchanged diffuse slightly nodular interstitial thickening. No new focal airspace opacities.  No definite evidence of edema. Unchanged bones.  IMPRESSION: 1.  Interval placement of left subclavian vein approach port-a- catheter with tip projecting over the mid SVC.  No pneumothorax. 2.  Grossly unchanged findings of extensive metastatic disease to the chest.   Original Report Authenticated By: Tacey Ruiz, MD   Dg Fluoro Guide Cv Line-no Report  02/16/2013   CLINICAL DATA: porta catheter insertion   FLOURO GUIDE CV LINE  Fluoroscopy was utilized by the requesting physician.  No radiographic  interpretation.     ASSESSMENT AND PLAN: This is a very pleasant 52 years old white female recently diagnosed with metastatic non-small cell lung cancer, squamous cell carcinoma. She is currently undergoing systemic chemotherapy with carboplatin and Abraxane and tolerating it fairly well. The  patient was discussed with and also seen by Dr. Arbutus Ped. Regarding her skin lesion on her left forearm. We'll continue to monitor this area  however the patient may also see a dermatologist for further evaluation. She'll proceed with day 8 cycle #2 of her systemic chemotherapy with Abraxane. She will continue with her current pain medications as prescribed by her pain management physician, Dr. Lyman Bishop. We will continue to monitor her complaints of hip pain and we'll obtain imaging of this area if the symptoms persist or worsen. She will continue with her labs and chemotherapy as scheduled. She'll followup in 2 weeks for another symptom management visit prior to the start of cycle #3 also with a CBC differential and C. Met.  Rufina Kimery E, PA-C   I advised her to call immediately if she has any concerning symptoms in the interval.  All questions were answered. The patient knows to call the clinic with any problems, questions or concerns. We can certainly see the patient much sooner if necessary.  ADDENDUM: Hematology/Oncology Attending: I have a face to face encounter with the patient today. I recommended her care plan. The patient is feeling fine today with no specific complaints except for mild skin rash. The patient denied having any significant nausea or vomiting. She denied having any fever or chills. She is tolerating her systemic chemotherapy with carboplatin and Abraxane fairly well. She is here for day 8 of the second cycle of her chemotherapy. We will proceed with her treatment today as scheduled. The patient would come back for follow up visit in 2 weeks with the start of cycle #3. She was advised to call immediately she has any concerning symptoms in the interval.  Lajuana Matte., MD 03/09/2013

## 2013-03-10 NOTE — ED Provider Notes (Signed)
History    CSN: [id number] Arrival date & time 03/10/13  1618  First MD Initiated Contact with Patient 03/10/13 1720     Chief Complaint  Patient presents with  . cancer pt, SOB   . confused this am, dysuria    (Consider location/radiation/quality/duration/timing/severity/associated sxs/prior Treatment) Patient is a 52 y.o. female presenting with altered mental status.  Altered Mental Status Presenting symptoms: confusion   Presenting symptoms comment:  Mood lability Severity:  Moderate Most recent episode:  Today Episode history:  Multiple Duration:  1 hour Timing:  Constant Progression:  Resolved Chronicity:  New Context comment:  History of lung cancer Associated symptoms: abdominal pain (suprapubic) and nausea   Associated symptoms: no difficulty breathing, no fever, no headaches and no vomiting   Associated symptoms comment:  Dysuria  Past Medical History  Diagnosis Date  . Hypertension   . Hypothyroidism   . Anxiety   . Depression   . GERD (gastroesophageal reflux disease)   . Cancer     non- small cell squamous  . Shortness of breath   . COPD (chronic obstructive pulmonary disease)   . Headache(784.0)   . Arthritis     Hx; of osteoarthritis  . Diverticulosis     Hx: of   Past Surgical History  Procedure Laterality Date  . Abdominal hysterectomy    . Inner ear surgery    . Right -sided thoracic outlet surgery      Hx: of  . Colonoscopy w/ biopsies and polypectomy      Hx: of  . Rectal fistula repair      Hx: of  . Hemorrhoid surgery      Hx: of  . Portacath placement Bilateral 02/16/2013    Procedure: INSERTION PORT-A-CATH;  Surgeon: Kerin Perna, MD;  Location: [facility] OR;  Service: Thoracic;  Laterality: Bilateral;   Family History  Problem Relation Age of Onset  . Diabetes Mellitus II Father   . Heart failure Sister    History  Substance Use Topics  . Smoking status: Former Smoker -- 1.00 packs/day for 20 years    Types: Cigarettes    Quit  date: 12/31/2012  . Smokeless tobacco: Never Used  . Alcohol Use: No   OB History   Grav Para Term Preterm Abortions TAB SAB Ect Mult Living                 Review of Systems  Constitutional: Negative for fever.  HENT: Negative for congestion.   Respiratory: Negative for cough and shortness of breath.   Cardiovascular: Negative for chest pain.  Gastrointestinal: Positive for nausea and abdominal pain (suprapubic). Negative for vomiting and diarrhea.  Neurological: Negative for headaches.  Psychiatric/Behavioral: Positive for confusion and altered mental status.  All other systems reviewed and are negative.    Allergies  Review of patient's allergies indicates no known allergies.  Home Medications   Current Outpatient Rx  Name  Route  Sig  Dispense  Refill  . ALPRAZolam (XANAX) 0.25 MG tablet   Oral   Take 1 tablet (0.25 mg total) by mouth 3 (three) times daily as needed for anxiety.   30 tablet   0   . atenolol (TENORMIN) 50 MG tablet   Oral   Take 75 mg by mouth 2 (two) times daily.         . calcium-vitamin D (OSCAL WITH D) 500-200 MG-UNIT per tablet   Oral   Take 1 tablet by mouth every morning.         Marland Kitchen  carisoprodol (SOMA) 350 MG tablet   Oral   Take 350 mg by mouth 4 (four) times daily as needed for muscle spasms.         . Coenzyme Q10 (CO Q 10) 100 MG CAPS   Oral   Take 1 capsule by mouth every morning.          . docusate sodium (COLACE) 100 MG capsule   Oral   Take 100 mg by mouth as needed for constipation.         Marland Kitchen esomeprazole (NEXIUM) 40 MG capsule   Oral   Take 40 mg by mouth every other day.          . estradiol (ESTRACE) 2 MG tablet   Oral   Take 2 mg by mouth every morning.          . fexofenadine (ALLEGRA) 180 MG tablet   Oral   Take 180 mg by mouth daily as needed (alleriges).          Marland Kitchen FLUoxetine (PROZAC) 20 MG capsule   Oral   Take 20 mg by mouth every morning.          . fluticasone (FLONASE) 50 MCG/ACT  nasal spray   Nasal   Place 2 sprays into the nose daily as needed for rhinitis or allergies.          Marland Kitchen lamoTRIgine (LAMICTAL) 100 MG tablet   Oral   Take 100 mg by mouth 2 (two) times daily.          Marland Kitchen levothyroxine (SYNTHROID, LEVOTHROID) 50 MCG tablet   Oral   Take 50 mcg by mouth daily before breakfast.         . lidocaine-prilocaine (EMLA) cream   Topical   Apply 1 application topically as needed (to port site).         Marland Kitchen lisinopril (PRINIVIL,ZESTRIL) 30 MG tablet   Oral   Take 30 mg by mouth daily.         . Multiple Vitamin (MULTIVITAMIN) tablet   Oral   Take 1 tablet by mouth daily.         . NONFORMULARY OR COMPOUNDED ITEM   Topical   Apply 1 application topically 3 (three) times daily as needed (for pain). Special compounded cream from [facility] in [place], [place] ; contains ketamine, ketoprofen, amitriptyline.         Marland Kitchen oxybutynin (DITROPAN-XL) 10 MG 24 hr tablet   Oral   Take 10 mg by mouth at bedtime.         Marland Kitchen oxycodone (ROXICODONE) 30 MG immediate release tablet   Oral   Take 30 mg by mouth every 4 (four) hours as needed for pain.         Marland Kitchen PROAIR HFA 108 (90 BASE) MCG/ACT inhaler   Inhalation   Inhale 2 puffs into the lungs every 4 (four) hours as needed for wheezing or shortness of breath.          . prochlorperazine (COMPAZINE) 10 MG tablet   Oral   Take 10 mg by mouth every 6 (six) hours as needed (nausea).         . prochlorperazine (COMPAZINE) 25 MG suppository   Rectal   Place 1 suppository (25 mg total) rectally every 12 (twelve) hours as needed for nausea.   12 suppository   0   . simvastatin (ZOCOR) 40 MG tablet   Oral   Take 40 mg by mouth every evening.  BP 125/75  Pulse 54  Temp(Src) 97.9 F (36.6 C) (Oral)  Resp 18  SpO2 98% Physical Exam  Nursing note and vitals reviewed. Constitutional: She is oriented to person, place, and time. She appears well-developed and well-nourished. No distress.   HENT:  Head: Normocephalic and atraumatic.  Mouth/Throat: Oropharynx is clear and moist.  Eyes: Conjunctivae are normal. Pupils are equal, round, and reactive to light. No scleral icterus.  Neck: Neck supple.  Cardiovascular: Normal rate, regular rhythm, normal heart sounds and intact distal pulses.   No murmur heard. Pulmonary/Chest: Effort normal and breath sounds normal. No stridor. No respiratory distress. She has no rales.  Abdominal: Soft. Bowel sounds are normal. She exhibits no distension. There is no tenderness. There is no rebound and no guarding.  Musculoskeletal: Normal range of motion.  Neurological: She is alert and oriented to person, place, and time.  Skin: Skin is warm and dry. No rash noted.  Psychiatric: She has a normal mood and affect. Her behavior is normal.    ED Course  Procedures (including critical care time) Labs Reviewed  CBC WITH DIFFERENTIAL - Abnormal; Notable for the following:    RBC 3.18 (*)    Hemoglobin 8.7 (*)    HCT 26.3 (*)    All other components within normal limits  COMPREHENSIVE METABOLIC PANEL - Abnormal; Notable for the following:    Sodium 134 (*)    Chloride 95 (*)    Glucose, Bld 120 (*)    Albumin 2.5 (*)    Total Bilirubin 0.1 (*)    All other components within normal limits  URINALYSIS, ROUTINE W REFLEX MICROSCOPIC  TROPONIN I   Dg Chest 2 View  03/10/2013   *RADIOLOGY REPORT*  Clinical Data: Confusion.  CHEST - 2 VIEW  Comparison: 02/16/2013  Findings: There is a left chest wall porta-catheter with tip in the projection of the SVC.  The heart size appears normal.  There are bilateral pleural effusions identified left greater than right. There is mild interstitial edema.  When compared with the previous exam the pleural effusions and interstitial edema appears improved. No new findings identified.  IMPRESSION:  1.  Bilateral pleural effusions and mild edema appears improved from previous exam.   Original Report Authenticated By:  Signa Kell, M.D.  All radiology studies independently viewed by me.     1. Confusion     MDM  52 yo female with hx of cancer currently receiving chemotherapy presenting due a few episodes of mood lability and mild confusion over the past few days.  Most recent episode this morning.  She states she cried for minor reasons and was not acting herself, but was cognizant of her symptoms.  She thinks it may have been because she did not wear her oxygen last night during sleep.  She also reports mild suprapubic pain (without tenderness on exam) and dysuria.  Her urinalysis is normal.  The remainder of her labwork is unremarkable except for a Hg which is slowly trending down.  She reports light colored, nonbloody stools and no vaginal bleeding.  She denies lightheadedness, chest pain, or shortness of breath worse than normal.  She remained very well appearing during the remainder of her ED course and continued to deny return of her symptoms after observation in ED.  She was discharged home with return precautions and PCP followup.    Candyce Churn, MD 03/11/13 [phone]

## 2013-03-16 ENCOUNTER — Other Ambulatory Visit (HOSPITAL_BASED_OUTPATIENT_CLINIC_OR_DEPARTMENT_OTHER): Payer: BC Managed Care – PPO | Admitting: Lab

## 2013-03-16 ENCOUNTER — Ambulatory Visit (HOSPITAL_BASED_OUTPATIENT_CLINIC_OR_DEPARTMENT_OTHER): Payer: BC Managed Care – PPO

## 2013-03-16 VITALS — BP 99/65 | HR 51 | Temp 98.1°F

## 2013-03-16 DIAGNOSIS — C3490 Malignant neoplasm of unspecified part of unspecified bronchus or lung: Secondary | ICD-10-CM

## 2013-03-16 DIAGNOSIS — C770 Secondary and unspecified malignant neoplasm of lymph nodes of head, face and neck: Secondary | ICD-10-CM

## 2013-03-16 DIAGNOSIS — Z5111 Encounter for antineoplastic chemotherapy: Secondary | ICD-10-CM

## 2013-03-16 DIAGNOSIS — C3491 Malignant neoplasm of unspecified part of right bronchus or lung: Secondary | ICD-10-CM

## 2013-03-16 LAB — CBC WITH DIFFERENTIAL/PLATELET
BASO%: 0.4 % (ref 0.0–2.0)
Basophils Absolute: 0 10*3/uL (ref 0.0–0.1)
EOS%: 0.4 % (ref 0.0–7.0)
HGB: 8.8 g/dL — ABNORMAL LOW (ref 11.6–15.9)
MCH: 26.9 pg (ref 25.1–34.0)
MCHC: 32.5 g/dL (ref 31.5–36.0)
MCV: 82.9 fL (ref 79.5–101.0)
MONO%: 19.2 % — ABNORMAL HIGH (ref 0.0–14.0)
NEUT%: 41.2 % (ref 38.4–76.8)
RDW: 15 % — ABNORMAL HIGH (ref 11.2–14.5)
lymph#: 1 10*3/uL (ref 0.9–3.3)

## 2013-03-16 LAB — COMPREHENSIVE METABOLIC PANEL (CC13)
AST: 17 U/L (ref 5–34)
Alkaline Phosphatase: 108 U/L (ref 40–150)
BUN: 15.7 mg/dL (ref 7.0–26.0)
Glucose: 107 mg/dl (ref 70–140)
Sodium: 137 mEq/L (ref 136–145)
Total Bilirubin: <0.2 mg/dL (ref 0.20–1.20)

## 2013-03-16 MED ORDER — HEPARIN SOD (PORK) LOCK FLUSH 100 UNIT/ML IV SOLN
500.0000 [IU] | Freq: Once | INTRAVENOUS | Status: AC | PRN
  Administered 2013-03-16: 500 [IU]
  Filled 2013-03-16: qty 5

## 2013-03-16 MED ORDER — PACLITAXEL PROTEIN-BOUND CHEMO INJECTION 100 MG
100.0000 mg/m2 | Freq: Once | INTRAVENOUS | Status: AC
  Administered 2013-03-16: 175 mg via INTRAVENOUS
  Filled 2013-03-16: qty 35

## 2013-03-16 MED ORDER — SODIUM CHLORIDE 0.9 % IJ SOLN
10.0000 mL | INTRAMUSCULAR | Status: DC | PRN
  Administered 2013-03-16: 10 mL
  Filled 2013-03-16: qty 10

## 2013-03-16 MED ORDER — DEXAMETHASONE SODIUM PHOSPHATE 10 MG/ML IJ SOLN
10.0000 mg | Freq: Once | INTRAMUSCULAR | Status: AC
  Administered 2013-03-16: 10 mg via INTRAVENOUS

## 2013-03-16 MED ORDER — SODIUM CHLORIDE 0.9 % IV SOLN
Freq: Once | INTRAVENOUS | Status: AC
  Administered 2013-03-16: 14:00:00 via INTRAVENOUS

## 2013-03-16 MED ORDER — ONDANSETRON 8 MG/50ML IVPB (CHCC)
8.0000 mg | Freq: Once | INTRAVENOUS | Status: AC
  Administered 2013-03-16: 8 mg via INTRAVENOUS

## 2013-03-16 NOTE — Progress Notes (Signed)
Ok to treat per Dr. Mohamed. 

## 2013-03-16 NOTE — Patient Instructions (Addendum)
[facility] Discharge Instructions for Patients Receiving Chemotherapy  Today you received the following chemotherapy agents Abraxane  To help prevent nausea and vomiting after your treatment, we encourage you to take your nausea medication as prescribed.   If you develop nausea and vomiting that is not controlled by your nausea medication, call the clinic.   BELOW ARE SYMPTOMS THAT SHOULD BE REPORTED IMMEDIATELY:  *FEVER GREATER THAN 100.5 F  *CHILLS WITH OR WITHOUT FEVER  NAUSEA AND VOMITING THAT IS NOT CONTROLLED WITH YOUR NAUSEA MEDICATION  *UNUSUAL SHORTNESS OF BREATH  *UNUSUAL BRUISING OR BLEEDING  TENDERNESS IN MOUTH AND THROAT WITH OR WITHOUT PRESENCE OF ULCERS  *URINARY PROBLEMS  *BOWEL PROBLEMS  UNUSUAL RASH Items with * indicate a potential emergency and should be followed up as soon as possible.  Feel free to call the clinic you have any questions or concerns. The clinic phone number is [phone].    

## 2013-03-23 ENCOUNTER — Ambulatory Visit (HOSPITAL_BASED_OUTPATIENT_CLINIC_OR_DEPARTMENT_OTHER): Payer: BC Managed Care – PPO

## 2013-03-23 ENCOUNTER — Encounter: Payer: Self-pay | Admitting: Physician Assistant

## 2013-03-23 ENCOUNTER — Ambulatory Visit (HOSPITAL_BASED_OUTPATIENT_CLINIC_OR_DEPARTMENT_OTHER): Payer: BC Managed Care – PPO | Admitting: Physician Assistant

## 2013-03-23 ENCOUNTER — Other Ambulatory Visit: Payer: BC Managed Care – PPO | Admitting: Lab

## 2013-03-23 ENCOUNTER — Telehealth: Payer: Self-pay | Admitting: Internal Medicine

## 2013-03-23 ENCOUNTER — Other Ambulatory Visit (HOSPITAL_BASED_OUTPATIENT_CLINIC_OR_DEPARTMENT_OTHER): Payer: BC Managed Care – PPO | Admitting: Lab

## 2013-03-23 VITALS — BP 150/67 | HR 55 | Temp 98.0°F | Resp 20 | Ht 65.0 in | Wt 162.1 lb

## 2013-03-23 DIAGNOSIS — C3490 Malignant neoplasm of unspecified part of unspecified bronchus or lung: Secondary | ICD-10-CM

## 2013-03-23 DIAGNOSIS — C3491 Malignant neoplasm of unspecified part of right bronchus or lung: Secondary | ICD-10-CM

## 2013-03-23 DIAGNOSIS — Z5111 Encounter for antineoplastic chemotherapy: Secondary | ICD-10-CM

## 2013-03-23 DIAGNOSIS — L989 Disorder of the skin and subcutaneous tissue, unspecified: Secondary | ICD-10-CM

## 2013-03-23 LAB — CBC WITH DIFFERENTIAL/PLATELET
Basophils Absolute: 0 10*3/uL (ref 0.0–0.1)
Eosinophils Absolute: 0 10*3/uL (ref 0.0–0.5)
HGB: 8.7 g/dL — ABNORMAL LOW (ref 11.6–15.9)
LYMPH%: 41.7 % (ref 14.0–49.7)
MCH: 26.9 pg (ref 25.1–34.0)
MCV: 83.3 fL (ref 79.5–101.0)
MONO%: 12 % (ref 0.0–14.0)
NEUT#: 1.7 10*3/uL (ref 1.5–6.5)
NEUT%: 44.9 % (ref 38.4–76.8)
Platelets: 183 10*3/uL (ref 145–400)

## 2013-03-23 LAB — COMPREHENSIVE METABOLIC PANEL (CC13)
Albumin: 3 g/dL — ABNORMAL LOW (ref 3.5–5.0)
Alkaline Phosphatase: 113 U/L (ref 40–150)
BUN: 13.1 mg/dL (ref 7.0–26.0)
Creatinine: 0.8 mg/dL (ref 0.6–1.1)
Glucose: 104 mg/dl (ref 70–140)
Potassium: 4.3 mEq/L (ref 3.5–5.1)
Total Bilirubin: <0.2 mg/dL (ref 0.20–1.20)

## 2013-03-23 MED ORDER — ONDANSETRON 16 MG/50ML IVPB (CHCC)
16.0000 mg | Freq: Once | INTRAVENOUS | Status: AC
  Administered 2013-03-23: 16 mg via INTRAVENOUS

## 2013-03-23 MED ORDER — PACLITAXEL PROTEIN-BOUND CHEMO INJECTION 100 MG
100.0000 mg/m2 | Freq: Once | INTRAVENOUS | Status: AC
  Administered 2013-03-23: 200 mg via INTRAVENOUS
  Filled 2013-03-23: qty 40

## 2013-03-23 MED ORDER — HEPARIN SOD (PORK) LOCK FLUSH 100 UNIT/ML IV SOLN
500.0000 [IU] | Freq: Once | INTRAVENOUS | Status: AC | PRN
  Administered 2013-03-23: 500 [IU]
  Filled 2013-03-23: qty 5

## 2013-03-23 MED ORDER — CARBOPLATIN CHEMO INJECTION 600 MG/60ML
750.0000 mg | Freq: Once | INTRAVENOUS | Status: AC
  Administered 2013-03-23: 750 mg via INTRAVENOUS
  Filled 2013-03-23: qty 75

## 2013-03-23 MED ORDER — DEXAMETHASONE SODIUM PHOSPHATE 20 MG/5ML IJ SOLN
20.0000 mg | Freq: Once | INTRAMUSCULAR | Status: AC
  Administered 2013-03-23: 20 mg via INTRAVENOUS

## 2013-03-23 MED ORDER — SODIUM CHLORIDE 0.9 % IV SOLN
Freq: Once | INTRAVENOUS | Status: AC
  Administered 2013-03-23: 14:00:00 via INTRAVENOUS

## 2013-03-23 MED ORDER — SODIUM CHLORIDE 0.9 % IJ SOLN
10.0000 mL | INTRAMUSCULAR | Status: DC | PRN
  Administered 2013-03-23: 10 mL
  Filled 2013-03-23: qty 10

## 2013-03-23 NOTE — Progress Notes (Addendum)
[facility] [phone]   [phone]  OFFICE PROGRESS NOTE  WHITLOW, [name], MD [address] [place] [address]  DIAGNOSIS: Stage IV non-small cell lung cancer, squamous cell carcinoma diagnosed in June of 2014.  PRIOR THERAPY: None  CURRENT THERAPY: Systemic chemotherapy with carboplatin for AUC of 5 on day 1 and Abraxane 100 mg/M2 on days 1, 8 and 15 every 3 weeks. Status post 2 cycles  DISEASE STAGE: Stage IV non-small cell lung cancer, squamous cell carcinoma diagnosed in June of 2014.  CHEMOTHERAPY INTENT: Palliative  CURRENT # OF CHEMOTHERAPY CYCLES: 2  CURRENT ANTIEMETICS: Zofran, dexamethasone, Compazine  CURRENT SMOKING STATUS: Former smoker, quit 12/31/2012  ORAL CHEMOTHERAPY AND CONSENT: n/a  CURRENT BISPHOSPHONATES USE:  none  PAIN MANAGEMENT: oxycodone  NARCOTICS INDUCED CONSTIPATION:  none  LIVING WILL AND CODE STATUS:    INTERVAL HISTORY: Jodi Shepherd 52 y.o. female returns to the clinic today for followup visit.  The patient tolerated the first dose of her systemic chemotherapy fairly well with no significant adverse effects except for mild nausea she reports that she went to emergency room the day after her last office visit do to "not feeling quite right". It turns out she was not using her oxygen as she should and this has been rectified. She denied having any night sweats. The patient denied having any chest pain, shortness of breath, cough or hemoptysis. She feels the lymph nodes in the supraclavicular area are continuing to get smaller. She is here today to start day 1 of cycle 3 of her chemotherapy. She reports that the skin lesion on her left forearm remains about the same. She does however complain of some itching in her upper back area. Her skin is somewhat dry generally. She's had some intermittent diarrhea and constipation however her bowel movements have normalized now that she has altered her diet to include  eating more fruit.  MEDICAL HISTORY: Past Medical History  Diagnosis Date  . Hypertension   . Hypothyroidism   . Anxiety   . Depression   . GERD (gastroesophageal reflux disease)   . Cancer     non- small cell squamous  . Shortness of breath   . COPD (chronic obstructive pulmonary disease)   . Headache(784.0)   . Arthritis     Hx; of osteoarthritis  . Diverticulosis     Hx: of    ALLERGIES:  has No Known Allergies.  MEDICATIONS:  Current Outpatient Prescriptions  Medication Sig Dispense Refill  . ALPRAZolam (XANAX) 0.25 MG tablet Take 1 tablet (0.25 mg total) by mouth 3 (three) times daily as needed for anxiety.  30 tablet  0  . atenolol (TENORMIN) 50 MG tablet Take 75 mg by mouth 2 (two) times daily.      . calcium-vitamin D (OSCAL WITH D) 500-200 MG-UNIT per tablet Take 1 tablet by mouth every morning.      . carisoprodol (SOMA) 350 MG tablet Take 350 mg by mouth 4 (four) times daily as needed for muscle spasms.      . Coenzyme Q10 (CO Q 10) 100 MG CAPS Take 1 capsule by mouth every morning.       . docusate sodium (COLACE) 100 MG capsule Take 100 mg by mouth as needed for constipation.      Marland Kitchen esomeprazole (NEXIUM) 40 MG capsule Take 40 mg by mouth every other day.       . estradiol (ESTRACE) 2 MG tablet Take 2  mg by mouth every morning.       . fexofenadine (ALLEGRA) 180 MG tablet Take 180 mg by mouth daily as needed (alleriges).       Marland Kitchen FLUoxetine (PROZAC) 20 MG capsule Take 20 mg by mouth every morning.       . fluticasone (FLONASE) 50 MCG/ACT nasal spray Place 2 sprays into the nose daily as needed for rhinitis or allergies.       Marland Kitchen lamoTRIgine (LAMICTAL) 100 MG tablet Take 100 mg by mouth 2 (two) times daily.       Marland Kitchen levothyroxine (SYNTHROID, LEVOTHROID) 50 MCG tablet Take 50 mcg by mouth daily before breakfast.      . lidocaine-prilocaine (EMLA) cream Apply 1 application topically as needed (to port site).      Marland Kitchen lisinopril (PRINIVIL,ZESTRIL) 30 MG tablet Take 30 mg by  mouth daily.      . Multiple Vitamin (MULTIVITAMIN) tablet Take 1 tablet by mouth daily.      . NONFORMULARY OR COMPOUNDED ITEM Apply 1 application topically 3 (three) times daily as needed (for pain). Special compounded cream from [facility] in [place], [place] ; contains ketamine, ketoprofen, amitriptyline.      Marland Kitchen oxybutynin (DITROPAN-XL) 10 MG 24 hr tablet Take 10 mg by mouth at bedtime.      Marland Kitchen oxycodone (ROXICODONE) 30 MG immediate release tablet Take 30 mg by mouth every 4 (four) hours as needed for pain.      Marland Kitchen PROAIR HFA 108 (90 BASE) MCG/ACT inhaler Inhale 2 puffs into the lungs every 4 (four) hours as needed for wheezing or shortness of breath.       . prochlorperazine (COMPAZINE) 10 MG tablet Take 10 mg by mouth every 6 (six) hours as needed (nausea).      . prochlorperazine (COMPAZINE) 25 MG suppository Place 1 suppository (25 mg total) rectally every 12 (twelve) hours as needed for nausea.  12 suppository  0  . simvastatin (ZOCOR) 40 MG tablet Take 40 mg by mouth every evening.       No current facility-administered medications for this visit.   Facility-Administered Medications Ordered in Other Visits  Medication Dose Route Frequency Provider Last Rate Last Dose  . CARBOplatin (PARAPLATIN) 750 mg in sodium chloride 0.9 % 250 mL chemo infusion  750 mg Intravenous Once Climmie Buelow E Tevita Gomer, PA-C      . heparin lock flush 100 unit/mL  500 Units Intracatheter Once PRN Keilany Burnette E Osceola Holian, PA-C      . sodium chloride 0.9 % injection 10 mL  10 mL Intracatheter PRN Conni Slipper, PA-C        SURGICAL HISTORY:  Past Surgical History  Procedure Laterality Date  . Abdominal hysterectomy    . Inner ear surgery    . Right -sided thoracic outlet surgery      Hx: of  . Colonoscopy w/ biopsies and polypectomy      Hx: of  . Rectal fistula repair      Hx: of  . Hemorrhoid surgery      Hx: of  . Portacath placement Bilateral 02/16/2013    Procedure: INSERTION PORT-A-CATH;  Surgeon: Kerin Perna, MD;  Location: [facility] OR;  Service: Thoracic;  Laterality: Bilateral;    REVIEW OF SYSTEMS:  Pertinent items are noted in HPI.   PHYSICAL EXAMINATION: General appearance: alert, cooperative, fatigued and no distress Head: Normocephalic, without obvious abnormality, atraumatic Neck: no adenopathy Lymph nodes: Cervical, supraclavicular, and axillary nodes normal. Resp: clear to  auscultation bilaterally Cardio: regular rate and rhythm, S1, S2 normal, no murmur, click, rub or gallop GI: soft, non-tender; bowel sounds normal; no masses,  no organomegaly Extremities: extremities normal, atraumatic, no cyanosis or edema Neurologic: Alert and oriented X 3, normal strength and tone. Normal symmetric reflexes. Normal coordination and gait Skin: Reveals generally dry skin without any distinct rash or skin eruptions on the upper back region. On the flexor surface of the left forearm there is an approximately 3-4 mm dark lesion with some slight variation in the darkness.  ECOG PERFORMANCE STATUS: 1 - Symptomatic but completely ambulatory  Blood pressure 150/67, pulse 55, temperature 98 F (36.7 C), temperature source Oral, resp. rate 20, height 5\' 5"  (1.651 m), weight 162 lb 1.6 oz (73.528 kg).  LABORATORY DATA: Lab Results  Component Value Date   WBC 3.7* 03/23/2013   HGB 8.7* 03/23/2013   HCT 26.9* 03/23/2013   MCV 83.3 03/23/2013   PLT 183 03/23/2013      Chemistry      Component Value Date/Time   NA 137 03/23/2013 1147   NA 134* 03/10/2013 1655   K 4.3 03/23/2013 1147   K 3.8 03/10/2013 1655   CL 95* 03/10/2013 1655   CL 96* 02/09/2013 1131   CO2 27 03/23/2013 1147   CO2 28 03/10/2013 1655   BUN 13.1 03/23/2013 1147   BUN 17 03/10/2013 1655   CREATININE 0.8 03/23/2013 1147   CREATININE 0.70 03/10/2013 1655      Component Value Date/Time   CALCIUM 9.5 03/23/2013 1147   CALCIUM 9.3 03/10/2013 1655   ALKPHOS 113 03/23/2013 1147   ALKPHOS 110 03/10/2013 1655   AST 17 03/23/2013 1147   AST 23  03/10/2013 1655   ALT 12 03/23/2013 1147   ALT 27 03/10/2013 1655   BILITOT <0.20 Repeated and Verified 03/23/2013 1147   BILITOT 0.1* 03/10/2013 1655       RADIOGRAPHIC STUDIES:    Mr Laqueta Jean ZO Contrast  02/02/2013   *RADIOLOGY REPORT*  Clinical Data: 52 year old female with advanced thoracic malignancy, metastatic carcinoma most suggestive of non-small cell carcinoma on the left supraclavicular lymph node biopsy. Staging.  MRI HEAD WITHOUT AND WITH CONTRAST  Technique:  Multiplanar, multiecho pulse sequences of the brain and surrounding structures were obtained according to standard protocol without and with intravenous contrast  Contrast: 15mL MULTIHANCE GADOBENATE DIMEGLUMINE 529 MG/ML IV SOLN  Comparison: Chest CT 01/14/2013.  Findings: No abnormal enhancement identified.  No midline shift, mass effect, or evidence of mass lesion.  No abnormal diffusion signal. No restricted diffusion to suggest acute infarction.  No acute intracranial hemorrhage identified. Normal cerebral volume.  No ventriculomegaly.  Negative pituitary, cervicomedullary junction and visualized cervical spine.  Skull and visualized upper cervical spine bone marrow signal is within normal limits.  No dural thickening identified.  Small chronic lacunar infarct right caudate nucleus.  Otherwise minimal cerebral white matter T2 and FLAIR hyperintensity.  Deep gray matter nuclei, brainstem and cerebellum within normal limits. Major intracranial vascular flow voids are preserved.  Mastoids are clear.  Grossly normal visualized internal auditory structures.  Paranasal sinuses are clear. Visualized orbit soft tissues are within normal limits.  Negative scalp soft tissues. Previously seen lower cervical lymph node enlargement not included on these images.  IMPRESSION: 1. No acute or metastatic intracranial abnormality. 2.  Minimal to mild chronic small vessel ischemia.   Original Report Authenticated By: Erskine Speed, M.D.   Nm Pet Image  Initial (pi) Skull Base  To Thigh  02/02/2013   *RADIOLOGY REPORT*  Clinical Data: Initial treatment strategy for lung cancer. Staging scan.  NUCLEAR MEDICINE PET SKULL BASE TO THIGH  Fasting Blood Glucose:  95  Technique:  19 mCi F-18 FDG was injected intravenously. CT data was obtained and used for attenuation correction and anatomic localization only.  (This was not acquired as a diagnostic CT examination.) Additional exam technical data entered on technologist worksheet.  Comparison:  Chest CT 01/14/2013.  Findings:  Neck: A small focus of hypermetabolic activity in the left side of the mandible (SUVmax = 6.8)favored to represent underlying dental disease.  Extensive bilateral supraclavicular lymphadenopathy which is markedly hypermetabolic (SUVmax = 45.6 - 54.0), measuring up to 2.2 x 3.5 cm in the right supraclavicular station.  Chest:  Innumerable enlarged and hypermetabolic lymph nodes throughout the mediastinum and bilateral hilar regions (SUVmax = 56.0), measuring up to 4.1 x 3.5 cm and 4.0 x 3.3 cm in the high and low right paratracheal stations respectively.  A subcarinal nodal mass is present measuring up to 3.0 x 6.4 cm (SUVmax = 42.3). No definite intraparenchymal nodule or mass is confidently identified within the lungs.  However, there is extensive pleural nodularity which is hypermetabolic (SUVmax = 17.3 - 26.5). Moderate bilateral malignant pleural effusions are noted with associated passive atelectasis in portions of the lungs bilaterally.  Thickening of the peribronchovascular interstitium is noted, most pronounced in the lower lobes of the lungs bilaterally. Moderate centrilobular and paraseptal emphysema. Heart size is normal. There is no significant pericardial fluid, thickening or pericardial calcification.  The esophagus is displaced by subcarinal nodal mass, but is otherwise unremarkable in appearance.  Abdomen/Pelvis:  Extensive hypermetabolic activity throughout the upper abdomen and  retroperitoneum, with numerous enlarged lymph nodes throughout these regions, measuring up to 18 x 25 mm in the gastrohepatic ligament, with a malignant range metabolic activity (SUVmax = 43.0).  No hypermetabolic activity or focal lesion identified within the liver, pancreas, spleen, left adrenal gland or either kidney.  In the lateral limb of the right adrenal gland there is a 2.0 x 0.7 cm intermediate attenuation nodule (39 HU) which demonstrates some low level of metabolic activity (SUVmax = 3.7), which could represent a metastatic lesion.  Retroperitoneal lymphadenopathy extends into the immediate infrarenal region, but does not extend into the anatomic pelvis.  No significant volume of ascites.  No pneumoperitoneum.  No pathologic distension of small bowel.  Status post hysterectomy.  Ovaries are unremarkable in appearance.  The bladder is normal in appearance.  Skeleton:  No focal hypermetabolic activity to suggest skeletal metastasis.  IMPRESSION: 1.  Extensive metastatic disease throughout the thorax and upper abdomen, as above.  An exact primary lesion in the lung is not confidently identified on today's examination, although any one of these pleural based hypermetabolic nodules can certainly be a primary lung lesion, this is not strongly favored.  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 2.  Potential small right adrenal metastasis also noted. 3.  Bilateral malignant pleural effusions. 4.  Additional incidental findings, as above.   Original Report Authenticated By: Trudie Reed, M.D.   US Biopsy  01/26/2013   *RADIOLOGY REPORT*  Clinical history:52 year old with extensive is neck and thoracic lymphadenopathy.  No malignant cells identified on pleural fluid cytology.  Definitive diagnosis is needed.  PROCEDURE(S): ULTRASOUND GUIDED CORE BIOPSIES OF A LEFT  SUPRACLAVICULAR LYMPH  NODE  Physician: Rachelle Hora. Henn, MD  Medications:None  Moderate sedation time:None  Fluoroscopy time: None  Procedure:The procedure was explained to the patient.  The risks and benefits of the procedure were discussed and the patient's questions were addressed.  Informed consent was obtained from the patient.  The neck was evaluated with ultrasound.  Multiple large lymph nodes were identified on both sides of the neck.  A left supraclavicular lymph node was targeted for biopsy.  The left side of the neck was prepped and draped in a sterile fashion.  The skin was anesthetized with 1% lidocaine.  Using ultrasound guidance, four core biopsies were obtained with an 18 gauge core device. Samples placed in saline.  Findings:Multiple enlarged hypoechoic lymph nodes on both sides of the neck.  Needle position confirmed within the left supraclavicular lymph node.  Complications: None  Impression:Ultrasound guided core biopsies of a left supraclavicular lymph node.   Original Report Authenticated By: Richarda Overlie, M.D.   Dg Chest Port 1 View  02/16/2013   *RADIOLOGY REPORT*  Clinical Data: History of lung cancer, post porta-a-catheter placement  PORTABLE CHEST - 1 VIEW  Comparison: 02/16/2013; PET CT - 02/03/2023  Findings:  Grossly unchanged enlarged cardiac silhouette and mediastinal contours with nodular prominence of the mediastinum compatible with known bulky hilar lymphadenopathy.  Interval placement of a left anterior chest wall subclavian vein approach port catheter with tip terminating over the mid SVC.  No pneumothorax.  Grossly unchanged partially loculated small bilateral effusions and associated bibasilar opacities, left greater than right.  There is grossly unchanged diffuse slightly nodular interstitial thickening. No new focal airspace opacities.  No definite evidence of edema. Unchanged bones.  IMPRESSION: 1.  Interval placement of left subclavian vein approach port-a- catheter with tip  projecting over the mid SVC.  No pneumothorax. 2.  Grossly unchanged findings of extensive metastatic disease to the chest.   Original Report Authenticated By: Tacey Ruiz, MD   Dg Fluoro Guide Cv Line-no Report  02/16/2013   CLINICAL DATA: porta catheter insertion   FLOURO GUIDE CV LINE  Fluoroscopy was utilized by the requesting physician.  No radiographic  interpretation.     ASSESSMENT AND PLAN: This is a very pleasant 52 years old white female recently diagnosed with metastatic non-small cell lung cancer, squamous cell carcinoma. She is currently undergoing systemic chemotherapy with carboplatin and Abraxane and tolerating it fairly well. The patient was discussed with and also seen by Dr. Arbutus Ped. Regarding her skin lesion on her left forearm. We'll continue to monitor this area however the patient may also see a dermatologist for further evaluation. She'll proceed with day 1 of cycle 3 of her systemic chemotherapy with carboplatin for an AUC of 5 given on day 1 and Abraxane 100 mg per meter squared given on days 1, 8 and 15 every 3 weeks. She will continue with weekly labs as scheduled. She'll followup with Dr. Arbutus Ped in 3 weeks with a restaging CT scan of her chest, abdomen and pelvis with contrast to reevaluate her disease.   Rosaleigh Brazzel E, PA-C   I advised her to call immediately if she has any concerning symptoms in the interval.  All questions were answered. The patient knows to call the clinic with any problems, questions or concerns. We can certainly see the patient much sooner if necessary.  ADDENDUM: Hematology/Oncology Attending: I have a face to face encounter with the patient today. I recommended her care plan. The patient is tolerating her treatment fairly  well. She denied having any significant nausea or vomiting. She denied having any fever or chills. She continues to complain of fatigue. We will proceed with cycle #3 today as scheduled. The patient would come back for  follow up visit in 3 weeks with repeat CT scan of the chest, abdomen and pelvis for restaging of her disease. She was advised to call immediately if she has any concerning symptoms in the interval.  Lajuana Matte., MD 03/23/2013

## 2013-03-23 NOTE — Patient Instructions (Addendum)
Contiue labs and chemotherapy as scheduled Follow up with Dr. Arbutus Ped in 3 weeks with a restaging CT scan of your chest, abdomen and pelvis to re-evaluate your disease

## 2013-03-23 NOTE — Patient Instructions (Addendum)
[facility] Discharge Instructions for Patients Receiving Chemotherapy  Today you received the following chemotherapy agents Abraxane/Carboplatin.  To help prevent nausea and vomiting after your treatment, we encourage you to take your nausea medication as prescribed.   If you develop nausea and vomiting that is not controlled by your nausea medication, call the clinic.   BELOW ARE SYMPTOMS THAT SHOULD BE REPORTED IMMEDIATELY:  *FEVER GREATER THAN 100.5 F  *CHILLS WITH OR WITHOUT FEVER  NAUSEA AND VOMITING THAT IS NOT CONTROLLED WITH YOUR NAUSEA MEDICATION  *UNUSUAL SHORTNESS OF BREATH  *UNUSUAL BRUISING OR BLEEDING  TENDERNESS IN MOUTH AND THROAT WITH OR WITHOUT PRESENCE OF ULCERS  *URINARY PROBLEMS  *BOWEL PROBLEMS  UNUSUAL RASH Items with * indicate a potential emergency and should be followed up as soon as possible.  Feel free to call the clinic you have any questions or concerns. The clinic phone number is [phone].    

## 2013-03-23 NOTE — Telephone Encounter (Signed)
gv and printed appt sched and avs for pt .Jodi Shepherd..emailed Dr. Arbutus Ped forr d/t for est.

## 2013-03-27 ENCOUNTER — Other Ambulatory Visit: Payer: Self-pay | Admitting: Internal Medicine

## 2013-03-27 ENCOUNTER — Telehealth: Payer: Self-pay | Admitting: Internal Medicine

## 2013-03-27 NOTE — Telephone Encounter (Signed)
left paperwork for MD to move chemo to 8.19.14..will call pt with d/t

## 2013-03-28 ENCOUNTER — Telehealth: Payer: Self-pay | Admitting: Internal Medicine

## 2013-03-28 NOTE — Telephone Encounter (Signed)
lvm for pt regarding to 8.8.14 and 8.19.14 appt...per Dr. Kerry Fort 8.19 is when he would like to see the pt

## 2013-03-30 ENCOUNTER — Telehealth: Payer: Self-pay | Admitting: Medical Oncology

## 2013-03-30 NOTE — Telephone Encounter (Signed)
I Left message on pts home phone with appt information for tomorrow.

## 2013-03-31 ENCOUNTER — Ambulatory Visit (HOSPITAL_BASED_OUTPATIENT_CLINIC_OR_DEPARTMENT_OTHER): Payer: BC Managed Care – PPO

## 2013-03-31 ENCOUNTER — Encounter (HOSPITAL_COMMUNITY)
Admission: RE | Admit: 2013-03-31 | Discharge: 2013-03-31 | Disposition: A | Discharge status: Home / Self Care | Payer: BC Managed Care – PPO | Source: Ambulatory Visit | Attending: Internal Medicine | Admitting: Internal Medicine

## 2013-03-31 ENCOUNTER — Other Ambulatory Visit: Payer: Self-pay | Admitting: Internal Medicine

## 2013-03-31 ENCOUNTER — Other Ambulatory Visit (HOSPITAL_BASED_OUTPATIENT_CLINIC_OR_DEPARTMENT_OTHER): Payer: BC Managed Care – PPO

## 2013-03-31 ENCOUNTER — Encounter (INDEPENDENT_AMBULATORY_CARE_PROVIDER_SITE_OTHER): Payer: Self-pay | Admitting: Ophthalmology

## 2013-03-31 VITALS — BP 138/70 | HR 73 | Temp 98.4°F | Resp 18

## 2013-03-31 DIAGNOSIS — C3491 Malignant neoplasm of unspecified part of right bronchus or lung: Secondary | ICD-10-CM

## 2013-03-31 DIAGNOSIS — C3490 Malignant neoplasm of unspecified part of unspecified bronchus or lung: Secondary | ICD-10-CM

## 2013-03-31 DIAGNOSIS — D649 Anemia, unspecified: Secondary | ICD-10-CM

## 2013-03-31 DIAGNOSIS — Z5111 Encounter for antineoplastic chemotherapy: Secondary | ICD-10-CM

## 2013-03-31 DIAGNOSIS — C770 Secondary and unspecified malignant neoplasm of lymph nodes of head, face and neck: Secondary | ICD-10-CM

## 2013-03-31 LAB — COMPREHENSIVE METABOLIC PANEL (CC13)
Albumin: 2.8 g/dL — ABNORMAL LOW (ref 3.5–5.0)
Alkaline Phosphatase: 91 U/L (ref 40–150)
BUN: 10.5 mg/dL (ref 7.0–26.0)
Calcium: 9.2 mg/dL (ref 8.4–10.4)
Chloride: 102 mEq/L (ref 98–109)
Glucose: 102 mg/dl (ref 70–140)
Potassium: 3.6 mEq/L (ref 3.5–5.1)

## 2013-03-31 LAB — CBC WITH DIFFERENTIAL/PLATELET
Basophils Absolute: 0 10*3/uL (ref 0.0–0.1)
Eosinophils Absolute: 0 10*3/uL (ref 0.0–0.5)
HCT: 24.4 % — ABNORMAL LOW (ref 34.8–46.6)
HGB: 8 g/dL — ABNORMAL LOW (ref 11.6–15.9)
MCV: 83 fL (ref 79.5–101.0)
MONO%: 9.8 % (ref 0.0–14.0)
NEUT#: 2.5 10*3/uL (ref 1.5–6.5)
RDW: 17.2 % — ABNORMAL HIGH (ref 11.2–14.5)

## 2013-03-31 LAB — URINALYSIS, MICROSCOPIC - CHCC
Leukocyte Esterase: NEGATIVE
Nitrite: NEGATIVE
Protein: <30 mg/dL
RBC / HPF: NEGATIVE (ref 0–2)
Urobilinogen, UR: 0.2 mg/dL (ref 0.2–1)

## 2013-03-31 LAB — PREPARE RBC (CROSSMATCH)

## 2013-03-31 LAB — ABO/RH: ABO/RH(D): O POS

## 2013-03-31 MED ORDER — PACLITAXEL PROTEIN-BOUND CHEMO INJECTION 100 MG
100.0000 mg/m2 | Freq: Once | INTRAVENOUS | Status: AC
  Administered 2013-03-31: 200 mg via INTRAVENOUS
  Filled 2013-03-31: qty 40

## 2013-03-31 MED ORDER — SODIUM CHLORIDE 0.9 % IV SOLN
Freq: Once | INTRAVENOUS | Status: AC
  Administered 2013-03-31: 14:00:00 via INTRAVENOUS

## 2013-03-31 MED ORDER — ONDANSETRON 8 MG/50ML IVPB (CHCC)
8.0000 mg | Freq: Once | INTRAVENOUS | Status: AC
  Administered 2013-03-31: 8 mg via INTRAVENOUS

## 2013-03-31 MED ORDER — SODIUM CHLORIDE 0.9 % IJ SOLN
10.0000 mL | INTRAMUSCULAR | Status: DC | PRN
  Administered 2013-03-31: 10 mL
  Filled 2013-03-31: qty 10

## 2013-03-31 MED ORDER — HEPARIN SOD (PORK) LOCK FLUSH 100 UNIT/ML IV SOLN
500.0000 [IU] | Freq: Once | INTRAVENOUS | Status: AC | PRN
  Administered 2013-03-31: 500 [IU]
  Filled 2013-03-31: qty 5

## 2013-03-31 MED ORDER — DEXAMETHASONE SODIUM PHOSPHATE 10 MG/ML IJ SOLN
10.0000 mg | Freq: Once | INTRAMUSCULAR | Status: AC
  Administered 2013-03-31: 10 mg via INTRAVENOUS

## 2013-03-31 NOTE — Patient Instructions (Addendum)
[facility] Discharge Instructions for Patients Receiving Chemotherapy  Today you received the following chemotherapy agents: Abraxane   To help prevent nausea and vomiting after your treatment, we encourage you to take your nausea medication as directed by your physician.   If you develop nausea and vomiting that is not controlled by your nausea medication, call the clinic.   BELOW ARE SYMPTOMS THAT SHOULD BE REPORTED IMMEDIATELY:  *FEVER GREATER THAN 100.5 F  *CHILLS WITH OR WITHOUT FEVER  NAUSEA AND VOMITING THAT IS NOT CONTROLLED WITH YOUR NAUSEA MEDICATION  *UNUSUAL SHORTNESS OF BREATH  *UNUSUAL BRUISING OR BLEEDING  TENDERNESS IN MOUTH AND THROAT WITH OR WITHOUT PRESENCE OF ULCERS  *URINARY PROBLEMS  *BOWEL PROBLEMS  UNUSUAL RASH Items with * indicate a potential emergency and should be followed up as soon as possible.  Feel free to call the clinic you have any questions or concerns. The clinic phone number is [phone].    

## 2013-03-31 NOTE — Progress Notes (Signed)
1405 Jodi Loft, NP notified of patient's labs today and c/o increased fatigue and SOB from baseline. Patient is currently on 2 L o2 via [place], arrived from home with O2 supply. Patient also c/o foul urine odor and discharge. Hgb 8.0 today, OK to treat per Adrena, NP, but orders entered for type and cross for RBC transfusion today. Pt will receive 1 unit today and 1 unit tomorrow. Clayborn Heron, RN

## 2013-04-01 ENCOUNTER — Ambulatory Visit (HOSPITAL_BASED_OUTPATIENT_CLINIC_OR_DEPARTMENT_OTHER): Payer: BC Managed Care – PPO

## 2013-04-01 VITALS — BP 157/85 | HR 58 | Temp 98.5°F | Resp 20

## 2013-04-01 DIAGNOSIS — D649 Anemia, unspecified: Secondary | ICD-10-CM

## 2013-04-01 MED ORDER — SODIUM CHLORIDE 0.9 % IV SOLN
250.0000 mL | Freq: Once | INTRAVENOUS | Status: AC
  Administered 2013-04-01: 250 mL via INTRAVENOUS

## 2013-04-01 MED ORDER — HEPARIN SOD (PORK) LOCK FLUSH 100 UNIT/ML IV SOLN
500.0000 [IU] | Freq: Every day | INTRAVENOUS | Status: AC | PRN
  Administered 2013-04-01: 500 [IU]
  Filled 2013-04-01: qty 5

## 2013-04-01 MED ORDER — SODIUM CHLORIDE 0.9 % IJ SOLN
10.0000 mL | INTRAMUSCULAR | Status: AC | PRN
  Administered 2013-04-01: 10 mL
  Filled 2013-04-01: qty 10

## 2013-04-01 NOTE — Patient Instructions (Addendum)
Blood Transfusion  A blood transfusion replaces your blood or some of its parts. Blood is replaced when you have lost blood because of surgery, an accident, or for severe blood conditions like anemia. You can donate blood to be used on yourself if you have a planned surgery. If you lose blood during that surgery, your own blood can be given back to you. Any blood given to you is checked to make sure it matches your blood type. Your temperature, blood pressure, and heart rate (vital signs) will be checked often.  GET HELP RIGHT AWAY IF:   You feel sick to your stomach (nauseous) or throw up (vomit).  You have watery poop (diarrhea).  You have shortness of breath or trouble breathing.  You have blood in your pee (urine) or have dark colored pee.  You have chest pain or tightness.  Your eyes or skin turn yellow (jaundice).  You have a temperature by mouth above 102 F (38.9 C), not controlled by medicine.  You start to shake and have chills.  You develop a a red rash (hives) or feel itchy.  You develop lightheadedness or feel confused.  You develop back, joint, or muscle pain.  You do not feel hungry (lost appetite).  You feel tired, restless, or nervous.  You develop belly (abdominal) cramps. Document Released: 11/06/2008 Document Revised: 11/02/2011 Document Reviewed: 11/06/2008 [employer] Patient Information 2014 [employer].  

## 2013-04-02 LAB — URINE CULTURE

## 2013-04-03 ENCOUNTER — Other Ambulatory Visit: Payer: Self-pay | Admitting: *Deleted

## 2013-04-03 ENCOUNTER — Encounter (INDEPENDENT_AMBULATORY_CARE_PROVIDER_SITE_OTHER): Payer: BC Managed Care – PPO | Admitting: Ophthalmology

## 2013-04-03 DIAGNOSIS — C3490 Malignant neoplasm of unspecified part of unspecified bronchus or lung: Secondary | ICD-10-CM

## 2013-04-03 DIAGNOSIS — H2510 Age-related nuclear cataract, unspecified eye: Secondary | ICD-10-CM

## 2013-04-03 DIAGNOSIS — H35039 Hypertensive retinopathy, unspecified eye: Secondary | ICD-10-CM

## 2013-04-03 DIAGNOSIS — H33309 Unspecified retinal break, unspecified eye: Secondary | ICD-10-CM

## 2013-04-03 DIAGNOSIS — I10 Essential (primary) hypertension: Secondary | ICD-10-CM

## 2013-04-03 DIAGNOSIS — H43819 Vitreous degeneration, unspecified eye: Secondary | ICD-10-CM

## 2013-04-03 LAB — TYPE AND SCREEN
ABO/RH(D): O POS
Antibody Screen: NEGATIVE
Unit division: 0
Unit division: 0

## 2013-04-03 MED ORDER — LIDOCAINE-PRILOCAINE 2.5-2.5 % EX CREA: 1.0000 "application " | TOPICAL_CREAM | CUTANEOUS | Status: DC | PRN

## 2013-04-07 ENCOUNTER — Other Ambulatory Visit: Payer: BC Managed Care – PPO | Admitting: Lab

## 2013-04-10 ENCOUNTER — Other Ambulatory Visit: Payer: BC Managed Care – PPO | Admitting: Lab

## 2013-04-10 ENCOUNTER — Ambulatory Visit: Payer: BC Managed Care – PPO

## 2013-04-10 ENCOUNTER — Ambulatory Visit (HOSPITAL_COMMUNITY)
Admission: RE | Admit: 2013-04-10 | Discharge: 2013-04-10 | Disposition: A | Discharge status: Home / Self Care | Payer: BC Managed Care – PPO | Source: Ambulatory Visit | Attending: Physician Assistant | Admitting: Physician Assistant

## 2013-04-10 DIAGNOSIS — K7689 Other specified diseases of liver: Secondary | ICD-10-CM | POA: Insufficient documentation

## 2013-04-10 DIAGNOSIS — I319 Disease of pericardium, unspecified: Secondary | ICD-10-CM | POA: Insufficient documentation

## 2013-04-10 DIAGNOSIS — R0602 Shortness of breath: Secondary | ICD-10-CM | POA: Insufficient documentation

## 2013-04-10 DIAGNOSIS — E278 Other specified disorders of adrenal gland: Secondary | ICD-10-CM | POA: Insufficient documentation

## 2013-04-10 DIAGNOSIS — C3490 Malignant neoplasm of unspecified part of unspecified bronchus or lung: Secondary | ICD-10-CM | POA: Insufficient documentation

## 2013-04-10 DIAGNOSIS — Z79899 Other long term (current) drug therapy: Secondary | ICD-10-CM | POA: Insufficient documentation

## 2013-04-10 DIAGNOSIS — R05 Cough: Secondary | ICD-10-CM | POA: Insufficient documentation

## 2013-04-10 DIAGNOSIS — I2510 Atherosclerotic heart disease of native coronary artery without angina pectoris: Secondary | ICD-10-CM | POA: Insufficient documentation

## 2013-04-10 DIAGNOSIS — R059 Cough, unspecified: Secondary | ICD-10-CM | POA: Insufficient documentation

## 2013-04-10 DIAGNOSIS — J90 Pleural effusion, not elsewhere classified: Secondary | ICD-10-CM | POA: Insufficient documentation

## 2013-04-10 DIAGNOSIS — C778 Secondary and unspecified malignant neoplasm of lymph nodes of multiple regions: Secondary | ICD-10-CM | POA: Insufficient documentation

## 2013-04-10 MED ORDER — IOHEXOL 300 MG/ML  SOLN
100.0000 mL | Freq: Once | INTRAMUSCULAR | Status: AC | PRN
  Administered 2013-04-10: 100 mL via INTRAVENOUS

## 2013-04-11 ENCOUNTER — Ambulatory Visit (HOSPITAL_BASED_OUTPATIENT_CLINIC_OR_DEPARTMENT_OTHER): Payer: BC Managed Care – PPO | Admitting: Internal Medicine

## 2013-04-11 ENCOUNTER — Encounter: Payer: Self-pay | Admitting: Internal Medicine

## 2013-04-11 ENCOUNTER — Ambulatory Visit (HOSPITAL_BASED_OUTPATIENT_CLINIC_OR_DEPARTMENT_OTHER): Payer: BC Managed Care – PPO

## 2013-04-11 ENCOUNTER — Other Ambulatory Visit (HOSPITAL_BASED_OUTPATIENT_CLINIC_OR_DEPARTMENT_OTHER): Payer: BC Managed Care – PPO | Admitting: Lab

## 2013-04-11 VITALS — BP 138/79 | HR 61 | Temp 98.5°F | Resp 20 | Ht 65.0 in | Wt 160.3 lb

## 2013-04-11 DIAGNOSIS — C3491 Malignant neoplasm of unspecified part of right bronchus or lung: Secondary | ICD-10-CM

## 2013-04-11 DIAGNOSIS — C3490 Malignant neoplasm of unspecified part of unspecified bronchus or lung: Secondary | ICD-10-CM

## 2013-04-11 DIAGNOSIS — R0602 Shortness of breath: Secondary | ICD-10-CM

## 2013-04-11 DIAGNOSIS — Z5111 Encounter for antineoplastic chemotherapy: Secondary | ICD-10-CM

## 2013-04-11 DIAGNOSIS — R5381 Other malaise: Secondary | ICD-10-CM

## 2013-04-11 LAB — CBC WITH DIFFERENTIAL/PLATELET
Basophils Absolute: 0 10*3/uL (ref 0.0–0.1)
EOS%: 0 % (ref 0.0–7.0)
HGB: 9.2 g/dL — ABNORMAL LOW (ref 11.6–15.9)
LYMPH%: 39.7 % (ref 14.0–49.7)
MCH: 28.2 pg (ref 25.1–34.0)
MCV: 85 fL (ref 79.5–101.0)
MONO%: 10.6 % (ref 0.0–14.0)
Platelets: 100 10*3/uL — ABNORMAL LOW (ref 145–400)
RDW: 18 % — ABNORMAL HIGH (ref 11.2–14.5)

## 2013-04-11 LAB — COMPREHENSIVE METABOLIC PANEL (CC13)
Alkaline Phosphatase: 75 U/L (ref 40–150)
BUN: 6.3 mg/dL — ABNORMAL LOW (ref 7.0–26.0)
CO2: 27 mEq/L (ref 22–29)
Creatinine: 0.7 mg/dL (ref 0.6–1.1)
Glucose: 92 mg/dl (ref 70–140)
Sodium: 142 mEq/L (ref 136–145)
Total Bilirubin: 0.23 mg/dL (ref 0.20–1.20)
Total Protein: 7 g/dL (ref 6.4–8.3)

## 2013-04-11 MED ORDER — HEPARIN SOD (PORK) LOCK FLUSH 100 UNIT/ML IV SOLN
500.0000 [IU] | Freq: Once | INTRAVENOUS | Status: AC | PRN
  Administered 2013-04-11: 500 [IU]
  Filled 2013-04-11: qty 5

## 2013-04-11 MED ORDER — DEXAMETHASONE SODIUM PHOSPHATE 10 MG/ML IJ SOLN
10.0000 mg | Freq: Once | INTRAMUSCULAR | Status: AC
  Administered 2013-04-11: 10 mg via INTRAVENOUS

## 2013-04-11 MED ORDER — SODIUM CHLORIDE 0.9 % IJ SOLN
10.0000 mL | INTRAMUSCULAR | Status: DC | PRN
  Administered 2013-04-11: 10 mL
  Filled 2013-04-11: qty 10

## 2013-04-11 MED ORDER — PACLITAXEL PROTEIN-BOUND CHEMO INJECTION 100 MG
100.0000 mg/m2 | Freq: Once | INTRAVENOUS | Status: AC
  Administered 2013-04-11: 200 mg via INTRAVENOUS
  Filled 2013-04-11: qty 40

## 2013-04-11 MED ORDER — ONDANSETRON 8 MG/50ML IVPB (CHCC)
8.0000 mg | Freq: Once | INTRAVENOUS | Status: AC
  Administered 2013-04-11: 8 mg via INTRAVENOUS

## 2013-04-11 MED ORDER — SODIUM CHLORIDE 0.9 % IV SOLN
Freq: Once | INTRAVENOUS | Status: AC
  Administered 2013-04-11: 15:00:00 via INTRAVENOUS

## 2013-04-11 NOTE — Patient Instructions (Signed)
[facility] Discharge Instructions for Patients Receiving Chemotherapy  Today you received the following chemotherapy agents Abraxane/Carbo  To help prevent nausea and vomiting after your treatment, we encourage you to take your nausea medication as directed.   If you develop nausea and vomiting that is not controlled by your nausea medication, call the clinic.   BELOW ARE SYMPTOMS THAT SHOULD BE REPORTED IMMEDIATELY:  *FEVER GREATER THAN 100.5 F  *CHILLS WITH OR WITHOUT FEVER  NAUSEA AND VOMITING THAT IS NOT CONTROLLED WITH YOUR NAUSEA MEDICATION  *UNUSUAL SHORTNESS OF BREATH  *UNUSUAL BRUISING OR BLEEDING  TENDERNESS IN MOUTH AND THROAT WITH OR WITHOUT PRESENCE OF ULCERS  *URINARY PROBLEMS  *BOWEL PROBLEMS  UNUSUAL RASH Items with * indicate a potential emergency and should be followed up as soon as possible.  Feel free to call the clinic you have any questions or concerns. The clinic phone number is [phone].

## 2013-04-11 NOTE — Patient Instructions (Signed)
CURRENT THERAPY: Systemic chemotherapy with carboplatin for AUC of 5 on day 1 and Abraxane 100 mg/M2 on days 1, 8 and 15 every 3 weeks. Status post 3 cycles  CHEMOTHERAPY INTENT: Palliative  CURRENT # OF CHEMOTHERAPY CYCLES: 3  CURRENT ANTIEMETICS: Zofran, dexamethasone, Compazine  CURRENT SMOKING STATUS: Former smoker, quit 12/31/2012  ORAL CHEMOTHERAPY AND CONSENT: n/a  CURRENT BISPHOSPHONATES USE: none  PAIN MANAGEMENT: oxycodone  NARCOTICS INDUCED CONSTIPATION: none  LIVING WILL AND CODE STATUS: Full code

## 2013-04-11 NOTE — Progress Notes (Signed)
[facility] Telephone:([phone]   [phone]  OFFICE PROGRESS NOTE  WHITLOW, [name], MD [address] [place] [address]  DIAGNOSIS: Stage IV non-small cell lung cancer, squamous cell carcinoma diagnosed in June of 2014.   PRIOR THERAPY: None   CURRENT THERAPY: Systemic chemotherapy with carboplatin for AUC of 5 on day 1 and Abraxane 100 mg/M2 on days 1, 8 and 15 every 3 weeks. Status post 3 cycles    CHEMOTHERAPY INTENT: Palliative  CURRENT # OF CHEMOTHERAPY CYCLES: 3  CURRENT ANTIEMETICS: Zofran, dexamethasone, Compazine  CURRENT SMOKING STATUS: Former smoker, quit 12/31/2012  ORAL CHEMOTHERAPY AND CONSENT: n/a  CURRENT BISPHOSPHONATES USE: none  PAIN MANAGEMENT: oxycodone  NARCOTICS INDUCED CONSTIPATION: none  LIVING WILL AND CODE STATUS: Full code.   INTERVAL HISTORY: Raizel Wesolowski 52 y.o. female returns to the clinic today for followup visit accompanied her husband. The patient is feeling fine today with no specific complaints except for fatigue. She tolerated the last dose of her systemic chemotherapy with Abraxane fairly well. She denied having any significant nausea or vomiting. She has no fever or chills. The patient denied having any significant chest pain but continues to have shortness of breath with exertion, no cough or hemoptysis. She had repeat CT scan of the chest, abdomen and pelvis performed recently and she is here for evaluation and discussion of her scan results.  MEDICAL HISTORY: Past Medical History  Diagnosis Date  . Hypertension   . Hypothyroidism   . Anxiety   . Depression   . GERD (gastroesophageal reflux disease)   . Cancer     non- small cell squamous  . Shortness of breath   . COPD (chronic obstructive pulmonary disease)   . Headache(784.0)   . Arthritis     Hx; of osteoarthritis  . Diverticulosis     Hx: of    ALLERGIES:  has No Known Allergies.  MEDICATIONS:  Current Outpatient Prescriptions    Medication Sig Dispense Refill  . ALPRAZolam (XANAX) 0.25 MG tablet Take 1 tablet (0.25 mg total) by mouth 3 (three) times daily as needed for anxiety.  30 tablet  0  . atenolol (TENORMIN) 50 MG tablet Take 75 mg by mouth 2 (two) times daily.      . calcium-vitamin D (OSCAL WITH D) 500-200 MG-UNIT per tablet Take 1 tablet by mouth every morning.      . carisoprodol (SOMA) 350 MG tablet Take 350 mg by mouth 4 (four) times daily as needed for muscle spasms.      . Coenzyme Q10 (CO Q 10) 100 MG CAPS Take 1 capsule by mouth every morning.       . docusate sodium (COLACE) 100 MG capsule Take 100 mg by mouth as needed for constipation.      Marland Kitchen esomeprazole (NEXIUM) 40 MG capsule Take 40 mg by mouth every other day.       . estradiol (ESTRACE) 2 MG tablet Take 2 mg by mouth every morning.       . fexofenadine (ALLEGRA) 180 MG tablet Take 180 mg by mouth daily as needed (alleriges).       Marland Kitchen FLUoxetine (PROZAC) 20 MG capsule Take 20 mg by mouth every morning.       . fluticasone (FLONASE) 50 MCG/ACT nasal spray Place 2 sprays into the nose daily as needed for rhinitis or allergies.       Marland Kitchen lamoTRIgine (LAMICTAL) 100 MG tablet Take 100 mg by mouth  2 (two) times daily.       Marland Kitchen levothyroxine (SYNTHROID, LEVOTHROID) 50 MCG tablet Take 50 mcg by mouth daily before breakfast.      . lidocaine-prilocaine (EMLA) cream Apply 1 application topically as needed (to port site).  30 g  0  . lisinopril (PRINIVIL,ZESTRIL) 30 MG tablet Take 30 mg by mouth daily.      . Multiple Vitamin (MULTIVITAMIN) tablet Take 1 tablet by mouth daily.      . NONFORMULARY OR COMPOUNDED ITEM Apply 1 application topically 3 (three) times daily as needed (for pain). Special compounded cream from [facility] in [place], [place] ; contains ketamine, ketoprofen, amitriptyline.      Marland Kitchen oxybutynin (DITROPAN-XL) 10 MG 24 hr tablet Take 10 mg by mouth at bedtime.      Marland Kitchen oxycodone (ROXICODONE) 30 MG immediate release tablet Take 30 mg by mouth every 4  (four) hours as needed for pain.      Marland Kitchen PROAIR HFA 108 (90 BASE) MCG/ACT inhaler Inhale 2 puffs into the lungs every 4 (four) hours as needed for wheezing or shortness of breath.       . prochlorperazine (COMPAZINE) 10 MG tablet Take 10 mg by mouth every 6 (six) hours as needed (nausea).      . prochlorperazine (COMPAZINE) 25 MG suppository Place 1 suppository (25 mg total) rectally every 12 (twelve) hours as needed for nausea.  12 suppository  0  . simvastatin (ZOCOR) 40 MG tablet Take 40 mg by mouth every evening.       No current facility-administered medications for this visit.    SURGICAL HISTORY:  Past Surgical History  Procedure Laterality Date  . Abdominal hysterectomy    . Inner ear surgery    . Right -sided thoracic outlet surgery      Hx: of  . Colonoscopy w/ biopsies and polypectomy      Hx: of  . Rectal fistula repair      Hx: of  . Hemorrhoid surgery      Hx: of  . Portacath placement Bilateral 02/16/2013    Procedure: INSERTION PORT-A-CATH;  Surgeon: Kerin Perna, MD;  Location: [facility] OR;  Service: Thoracic;  Laterality: Bilateral;    REVIEW OF SYSTEMS:  A comprehensive review of systems was negative except for: Constitutional: positive for fatigue Respiratory: positive for dyspnea on exertion   PHYSICAL EXAMINATION: General appearance: alert, cooperative, fatigued and no distress Head: Normocephalic, without obvious abnormality, atraumatic Neck: no adenopathy Lymph nodes: Cervical, supraclavicular, and axillary nodes normal. Resp: clear to auscultation bilaterally Cardio: regular rate and rhythm, S1, S2 normal, no murmur, click, rub or gallop GI: soft, non-tender; bowel sounds normal; no masses,  no organomegaly Extremities: extremities normal, atraumatic, no cyanosis or edema Neurologic: Alert and oriented X 3, normal strength and tone. Normal symmetric reflexes. Normal coordination and gait  ECOG PERFORMANCE STATUS: 1 - Symptomatic but completely  ambulatory  Blood pressure 138/79, pulse 61, temperature 98.5 F (36.9 C), temperature source Oral, resp. rate 20, height 5\' 5"  (1.651 m), weight 160 lb 4.8 oz (72.712 kg).  LABORATORY DATA: Lab Results  Component Value Date   WBC 2.8* 04/11/2013   HGB 9.2* 04/11/2013   HCT 27.7* 04/11/2013   MCV 85.0 04/11/2013   PLT 100* 04/11/2013      Chemistry      Component Value Date/Time   NA 138 03/31/2013 1229   NA 134* 03/10/2013 1655   K 3.6 03/31/2013 1229   K 3.8 03/10/2013 1655  CL 95* 03/10/2013 1655   CL 96* 02/09/2013 1131   CO2 26 03/31/2013 1229   CO2 28 03/10/2013 1655   BUN 10.5 03/31/2013 1229   BUN 17 03/10/2013 1655   CREATININE 0.7 03/31/2013 1229   CREATININE 0.70 03/10/2013 1655      Component Value Date/Time   CALCIUM 9.2 03/31/2013 1229   CALCIUM 9.3 03/10/2013 1655   ALKPHOS 91 03/31/2013 1229   ALKPHOS 110 03/10/2013 1655   AST 15 03/31/2013 1229   AST 23 03/10/2013 1655   ALT 13 03/31/2013 1229   ALT 27 03/10/2013 1655   BILITOT <0.20 03/31/2013 1229   BILITOT 0.1* 03/10/2013 1655       RADIOGRAPHIC STUDIES: Ct Chest W Contrast  04/10/2013   *RADIOLOGY REPORT*  Clinical Data:  Restaging non-small cell lung cancer.  Chemotherapy ongoing.  The patient reports shortness of breath and cough.  CT CHEST, ABDOMEN AND PELVIS WITH CONTRAST  Technique:  Multidetector CT imaging of the chest, abdomen and pelvis was performed following the standard protocol during bolus administration of intravenous contrast.  Contrast: OMNIPAQUE IOHEXOL 300 MG/ML  SOLN  Comparison:  PET CT 02/02/2013.  Chest CT 01/14/2013.    CT CHEST  Findings:  There has been significant improvement in the previously demonstrated extensive mediastinal, hilar and supraclavicular lymphadenopathy.  Supraclavicular nodes measure 11 mm on the right (image #3) and 10 mm on the left (image #2), compared with the 20 and 18 mm respectively on the prior study. The largest remaining right paratracheal node measures 1.6 cm on image 19  (previously 2.9 cm).  Subcarinal node measures 2.1 cm on image 30 (previously 3.8 cm).  The bilateral pleural effusions have decreased in volume but do appear partially loculated.  No pleural nodularity is identified. There is no significant residual pericardial effusion.  Left subclavian Port-A-Cath tip is in the upper SVC.  There is mild coronary artery disease.  Biapical scarring and scattered mild emphysematous changes are stable.  There is overall improved aeration of the lung bases. However, there is new ill-defined peribronchial nodularity in the left upper lobe (images 20 - 25), likely inflammatory.  IMPRESSION:  1.  Significant interval improvement, mediastinal and hilar lymphadenopathy. 2.  Decreased size of bilateral pleural and pericardial effusions. 3.  Improved aeration of the lungs with mild peribronchial nodularity in the left upper lobe, likely inflammatory.    CT ABDOMEN AND PELVIS  Findings:  A 2.2 x 1.5 cm right adrenal nodule on image 56 is stable in size.  This was only mildly hypermetabolic on PET CT. There is a stable tiny left adrenal nodule.  Inferiorly in the right hepatic lobe is a 2.0 cm low density lesion on image 87.  This was not imaged on the prior chest CT.  There was no hypermetabolic activity in this area on the PET CT.  Based on the reformatted images which demonstrate peripheral discontinuous enhancement of this lesion, this is favored to reflect an incidental hemangioma.  No other liver lesions are identified.  The spleen, gallbladder, biliary system, pancreas and kidneys demonstrate no suspicious findings.  The previously demonstrated upper abdominal adenopathy has improved.  There is a 9 mm portacaval node on image 63.  No enlarged retroperitoneal or mesenteric lymph nodes are identified. There is stable aorto iliac atherosclerosis.  The stomach, small bowel and colon appear normal.  There is no adnexal mass status post hysterectomy.  There are no worrisome osseous  findings.  IMPRESSION:  1.  Interval  improvement in metastatic lymphadenopathy in the upper abdomen. 2.  Unchanged small bilateral adrenal nodules. Based on the stability and lack of significantly increased metabolic activity on prior PET CT, these are favored to reflect adenomas. 3.  Low density lesion inferiorly in the right hepatic lobe demonstrates peripheral discontinuous enhancement and is probably an incidental hemangioma.  There was no suspicious activity in this area on prior PET CT.   Original Report Authenticated By: Carey Bullocks, M.D.   ASSESSMENT AND PLAN: This is a very pleasant 52 years old white female with metastatic non-small cell lung cancer, squamous cell carcinoma currently undergoing systemic chemotherapy with carboplatin and Abraxane status post 3 cycles with significant improvement in her disease on the recent scan. I discussed the scan results and showed the images to the patient and her husband. I recommended for her to continue her current chemotherapy with carboplatin and Abraxane as scheduled. He will receive day 15 of cycle #3 today. The patient would come back for followup visit in 3 weeks for evaluation and management any adverse effect of her treatment. She was advised to call immediately if she has any concerning symptoms in the interval.  The patient voices understanding of current disease status and treatment options and is in agreement with the current care plan.  All questions were answered. The patient knows to call the clinic with any problems, questions or concerns. We can certainly see the patient much sooner if necessary.  I spent 15 minutes counseling the patient face to face. The total time spent in the appointment was 25 minutes.

## 2013-04-12 ENCOUNTER — Encounter: Payer: Self-pay | Admitting: *Deleted

## 2013-04-12 ENCOUNTER — Telehealth: Payer: Self-pay | Admitting: *Deleted

## 2013-04-12 ENCOUNTER — Telehealth: Payer: Self-pay | Admitting: Internal Medicine

## 2013-04-12 NOTE — Telephone Encounter (Signed)
Per staff message and POF I have scheduled appts.  JMW  

## 2013-04-12 NOTE — Telephone Encounter (Signed)
lvm for pt regarding to weekly appts...with d/t

## 2013-04-12 NOTE — Progress Notes (Signed)
Spoke with pt regarding questions on scheduling and gas card.  Pt needed clarification on schedule.  Updated pt.  Pt needs help with transportation.  FA spoke with pt yesterday to help and Lauren, social worker aware as well. All questions and concerns addressed

## 2013-04-12 NOTE — Progress Notes (Signed)
[facility] Clinical Social Work  Clinical Social Work was referred by Charity fundraiser to follow up with patient regarding transportation assistance.  Clinical Social Worker met with patient in infusion room to assess for needs. Jodi Shepherd requests gas card assistance as she travels several hours each way [facility].  Jodi Shepherd states she has not heard from [facility].  CSW reviewed note, thoracic navigator turned in application 02/03/13.  CSW provided patient with Cancer Care application and requested she return to CSW at next visit.  CSW unaware of any other resources but will contact patient as new resources are explored.   Kathrin Penner, MSW, LCSW Clinical Social Worker [facility] [phone]

## 2013-04-14 ENCOUNTER — Other Ambulatory Visit: Payer: BC Managed Care – PPO | Admitting: Lab

## 2013-04-18 ENCOUNTER — Other Ambulatory Visit: Payer: Self-pay | Admitting: Internal Medicine

## 2013-04-18 ENCOUNTER — Other Ambulatory Visit (HOSPITAL_BASED_OUTPATIENT_CLINIC_OR_DEPARTMENT_OTHER): Payer: BC Managed Care – PPO | Admitting: Lab

## 2013-04-18 ENCOUNTER — Ambulatory Visit (HOSPITAL_BASED_OUTPATIENT_CLINIC_OR_DEPARTMENT_OTHER): Payer: BC Managed Care – PPO

## 2013-04-18 VITALS — BP 119/67 | HR 62 | Temp 97.3°F

## 2013-04-18 DIAGNOSIS — Z5111 Encounter for antineoplastic chemotherapy: Secondary | ICD-10-CM

## 2013-04-18 DIAGNOSIS — C3490 Malignant neoplasm of unspecified part of unspecified bronchus or lung: Secondary | ICD-10-CM

## 2013-04-18 DIAGNOSIS — C3491 Malignant neoplasm of unspecified part of right bronchus or lung: Secondary | ICD-10-CM

## 2013-04-18 DIAGNOSIS — C770 Secondary and unspecified malignant neoplasm of lymph nodes of head, face and neck: Secondary | ICD-10-CM

## 2013-04-18 LAB — COMPREHENSIVE METABOLIC PANEL (CC13)
ALT: 12 U/L (ref 0–55)
CO2: 27 mEq/L (ref 22–29)
Calcium: 9.3 mg/dL (ref 8.4–10.4)
Chloride: 103 mEq/L (ref 98–109)
Creatinine: 0.8 mg/dL (ref 0.6–1.1)
Glucose: 102 mg/dl (ref 70–140)
Total Bilirubin: <0.2 mg/dL (ref 0.20–1.20)

## 2013-04-18 LAB — CBC WITH DIFFERENTIAL/PLATELET
Basophils Absolute: 0 10*3/uL (ref 0.0–0.1)
Eosinophils Absolute: 0 10*3/uL (ref 0.0–0.5)
HCT: 26.6 % — ABNORMAL LOW (ref 34.8–46.6)
HGB: 8.6 g/dL — ABNORMAL LOW (ref 11.6–15.9)
LYMPH%: 37 % (ref 14.0–49.7)
MCV: 85.3 fL (ref 79.5–101.0)
MONO#: 0.3 10*3/uL (ref 0.1–0.9)
MONO%: 8.9 % (ref 0.0–14.0)
NEUT#: 1.5 10*3/uL (ref 1.5–6.5)
NEUT%: 51.7 % (ref 38.4–76.8)
Platelets: 178 10*3/uL (ref 145–400)
WBC: 2.9 10*3/uL — ABNORMAL LOW (ref 3.9–10.3)

## 2013-04-18 MED ORDER — SODIUM CHLORIDE 0.9 % IV SOLN
665.1000 mg | Freq: Once | INTRAVENOUS | Status: AC
  Administered 2013-04-18: 670 mg via INTRAVENOUS
  Filled 2013-04-18: qty 67

## 2013-04-18 MED ORDER — DEXAMETHASONE SODIUM PHOSPHATE 20 MG/5ML IJ SOLN
20.0000 mg | Freq: Once | INTRAMUSCULAR | Status: AC
  Administered 2013-04-18: 20 mg via INTRAVENOUS

## 2013-04-18 MED ORDER — SODIUM CHLORIDE 0.9 % IV SOLN
Freq: Once | INTRAVENOUS | Status: AC
  Administered 2013-04-18: 14:00:00 via INTRAVENOUS

## 2013-04-18 MED ORDER — HEPARIN SOD (PORK) LOCK FLUSH 100 UNIT/ML IV SOLN
500.0000 [IU] | Freq: Once | INTRAVENOUS | Status: AC | PRN
  Administered 2013-04-18: 500 [IU]
  Filled 2013-04-18: qty 5

## 2013-04-18 MED ORDER — ONDANSETRON 16 MG/50ML IVPB (CHCC)
16.0000 mg | Freq: Once | INTRAVENOUS | Status: AC
  Administered 2013-04-18: 16 mg via INTRAVENOUS

## 2013-04-18 MED ORDER — SODIUM CHLORIDE 0.9 % IJ SOLN
10.0000 mL | INTRAMUSCULAR | Status: DC | PRN
  Administered 2013-04-18: 10 mL
  Filled 2013-04-18: qty 10

## 2013-04-18 MED ORDER — PACLITAXEL PROTEIN-BOUND CHEMO INJECTION 100 MG
90.0000 mg/m2 | Freq: Once | INTRAVENOUS | Status: AC
  Administered 2013-04-18: 175 mg via INTRAVENOUS
  Filled 2013-04-18: qty 35

## 2013-04-18 NOTE — Patient Instructions (Signed)
[facility] Discharge Instructions for Patients Receiving Chemotherapy  Today you received the following chemotherapy agents Abraxane/Carboplatin.  To help prevent nausea and vomiting after your treatment, we encourage you to take your nausea medication as directed.    If you develop nausea and vomiting that is not controlled by your nausea medication, call the clinic.   BELOW ARE SYMPTOMS THAT SHOULD BE REPORTED IMMEDIATELY:  *FEVER GREATER THAN 100.5 F  *CHILLS WITH OR WITHOUT FEVER  NAUSEA AND VOMITING THAT IS NOT CONTROLLED WITH YOUR NAUSEA MEDICATION  *UNUSUAL SHORTNESS OF BREATH  *UNUSUAL BRUISING OR BLEEDING  TENDERNESS IN MOUTH AND THROAT WITH OR WITHOUT PRESENCE OF ULCERS  *URINARY PROBLEMS  *BOWEL PROBLEMS  UNUSUAL RASH Items with * indicate a potential emergency and should be followed up as soon as possible.  Feel free to call the clinic you have any questions or concerns. The clinic phone number is [phone].    

## 2013-04-21 ENCOUNTER — Other Ambulatory Visit: Payer: BC Managed Care – PPO | Admitting: Lab

## 2013-04-21 ENCOUNTER — Ambulatory Visit: Payer: BC Managed Care – PPO | Admitting: Physician Assistant

## 2013-04-26 ENCOUNTER — Ambulatory Visit (HOSPITAL_COMMUNITY)
Admission: RE | Admit: 2013-04-26 | Discharge: 2013-04-26 | Disposition: A | Discharge status: Home / Self Care | Payer: BC Managed Care – PPO | Source: Ambulatory Visit | Attending: Internal Medicine | Admitting: Internal Medicine

## 2013-04-26 ENCOUNTER — Other Ambulatory Visit (HOSPITAL_BASED_OUTPATIENT_CLINIC_OR_DEPARTMENT_OTHER): Payer: BC Managed Care – PPO | Admitting: Lab

## 2013-04-26 ENCOUNTER — Ambulatory Visit (HOSPITAL_BASED_OUTPATIENT_CLINIC_OR_DEPARTMENT_OTHER): Payer: BC Managed Care – PPO

## 2013-04-26 VITALS — BP 111/67 | HR 64 | Temp 97.0°F | Resp 20

## 2013-04-26 DIAGNOSIS — C3490 Malignant neoplasm of unspecified part of unspecified bronchus or lung: Secondary | ICD-10-CM

## 2013-04-26 DIAGNOSIS — Z5111 Encounter for antineoplastic chemotherapy: Secondary | ICD-10-CM

## 2013-04-26 DIAGNOSIS — D649 Anemia, unspecified: Secondary | ICD-10-CM

## 2013-04-26 DIAGNOSIS — C3491 Malignant neoplasm of unspecified part of right bronchus or lung: Secondary | ICD-10-CM

## 2013-04-26 DIAGNOSIS — C778 Secondary and unspecified malignant neoplasm of lymph nodes of multiple regions: Secondary | ICD-10-CM

## 2013-04-26 LAB — COMPREHENSIVE METABOLIC PANEL (CC13)
ALT: 10 U/L (ref 0–55)
Albumin: 2.8 g/dL — ABNORMAL LOW (ref 3.5–5.0)
CO2: 26 mEq/L (ref 22–29)
Calcium: 9.3 mg/dL (ref 8.4–10.4)
Chloride: 103 mEq/L (ref 98–109)
Glucose: 113 mg/dl (ref 70–140)
Sodium: 139 mEq/L (ref 136–145)
Total Protein: 6.5 g/dL (ref 6.4–8.3)

## 2013-04-26 LAB — CBC WITH DIFFERENTIAL/PLATELET
BASO%: 0.7 % (ref 0.0–2.0)
Eosinophils Absolute: 0 10*3/uL (ref 0.0–0.5)
HCT: 24.3 % — ABNORMAL LOW (ref 34.8–46.6)
LYMPH%: 37.2 % (ref 14.0–49.7)
MCHC: 33.7 g/dL (ref 31.5–36.0)
MONO#: 0.5 10*3/uL (ref 0.1–0.9)
NEUT#: 1.5 10*3/uL (ref 1.5–6.5)
NEUT%: 46.9 % (ref 38.4–76.8)
Platelets: 320 10*3/uL (ref 145–400)
RBC: 2.8 10*6/uL — ABNORMAL LOW (ref 3.70–5.45)
WBC: 3.2 10*3/uL — ABNORMAL LOW (ref 3.9–10.3)
lymph#: 1.2 10*3/uL (ref 0.9–3.3)

## 2013-04-26 MED ORDER — HEPARIN SOD (PORK) LOCK FLUSH 100 UNIT/ML IV SOLN
500.0000 [IU] | Freq: Once | INTRAVENOUS | Status: AC | PRN
  Administered 2013-04-26: 500 [IU]
  Filled 2013-04-26: qty 5

## 2013-04-26 MED ORDER — DEXAMETHASONE SODIUM PHOSPHATE 10 MG/ML IJ SOLN
10.0000 mg | Freq: Once | INTRAMUSCULAR | Status: AC
  Administered 2013-04-26: 10 mg via INTRAVENOUS

## 2013-04-26 MED ORDER — PACLITAXEL PROTEIN-BOUND CHEMO INJECTION 100 MG
90.0000 mg/m2 | Freq: Once | INTRAVENOUS | Status: AC
  Administered 2013-04-26: 175 mg via INTRAVENOUS
  Filled 2013-04-26: qty 35

## 2013-04-26 MED ORDER — SODIUM CHLORIDE 0.9 % IJ SOLN
10.0000 mL | INTRAMUSCULAR | Status: DC | PRN
  Administered 2013-04-26: 10 mL
  Filled 2013-04-26: qty 10

## 2013-04-26 MED ORDER — ONDANSETRON 8 MG/50ML IVPB (CHCC)
8.0000 mg | Freq: Once | INTRAVENOUS | Status: AC
  Administered 2013-04-26: 8 mg via INTRAVENOUS

## 2013-04-26 MED ORDER — SODIUM CHLORIDE 0.9 % IV SOLN: Freq: Once | INTRAVENOUS | Status: DC

## 2013-04-26 NOTE — Progress Notes (Signed)
OK to treat with Hgb 8.2 per Dr. Arbutus Ped.  Type and Cross - patient to receive 2 units blood on Friday.

## 2013-04-27 ENCOUNTER — Encounter: Payer: Self-pay | Admitting: Nutrition

## 2013-04-27 ENCOUNTER — Telehealth: Payer: Self-pay | Admitting: Internal Medicine

## 2013-04-27 NOTE — Telephone Encounter (Signed)
Message to maggie/michelle re adding blood 9/5.

## 2013-04-27 NOTE — Progress Notes (Signed)
Patient called and left a message that she wanted to defer nutrition appointment at this time.  Her truck broke down and she is having transportation issues along with "a lot on her plate".  She stated she would contact me when she wanted to schedule a nutrition appointment.

## 2013-04-28 ENCOUNTER — Telehealth: Payer: Self-pay | Admitting: Internal Medicine

## 2013-04-28 ENCOUNTER — Telehealth: Payer: Self-pay | Admitting: *Deleted

## 2013-04-28 LAB — PREPARE RBC (CROSSMATCH)

## 2013-04-28 NOTE — Telephone Encounter (Signed)
Late entry---Per staff  POF I have advised scheduler no room today for blood, tomorrow is an option. Heard from schedule patient needs today. Per charge RN 11am today, scheduler advised.

## 2013-04-29 ENCOUNTER — Emergency Department (HOSPITAL_COMMUNITY)
Admission: EM | Admit: 2013-04-29 | Discharge: 2013-04-29 | Disposition: A | Discharge status: Home / Self Care | Payer: BC Managed Care – PPO | Attending: Emergency Medicine | Admitting: Emergency Medicine

## 2013-04-29 ENCOUNTER — Encounter (HOSPITAL_COMMUNITY): Payer: Self-pay | Admitting: *Deleted

## 2013-04-29 DIAGNOSIS — Z8589 Personal history of malignant neoplasm of other organs and systems: Secondary | ICD-10-CM | POA: Insufficient documentation

## 2013-04-29 DIAGNOSIS — D649 Anemia, unspecified: Secondary | ICD-10-CM | POA: Insufficient documentation

## 2013-04-29 DIAGNOSIS — Z79899 Other long term (current) drug therapy: Secondary | ICD-10-CM | POA: Insufficient documentation

## 2013-04-29 DIAGNOSIS — Z8719 Personal history of other diseases of the digestive system: Secondary | ICD-10-CM | POA: Insufficient documentation

## 2013-04-29 DIAGNOSIS — F411 Generalized anxiety disorder: Secondary | ICD-10-CM | POA: Insufficient documentation

## 2013-04-29 DIAGNOSIS — J441 Chronic obstructive pulmonary disease with (acute) exacerbation: Secondary | ICD-10-CM | POA: Insufficient documentation

## 2013-04-29 DIAGNOSIS — I10 Essential (primary) hypertension: Secondary | ICD-10-CM | POA: Insufficient documentation

## 2013-04-29 DIAGNOSIS — E039 Hypothyroidism, unspecified: Secondary | ICD-10-CM | POA: Insufficient documentation

## 2013-04-29 DIAGNOSIS — R531 Weakness: Secondary | ICD-10-CM

## 2013-04-29 DIAGNOSIS — Z9221 Personal history of antineoplastic chemotherapy: Secondary | ICD-10-CM | POA: Insufficient documentation

## 2013-04-29 DIAGNOSIS — R5381 Other malaise: Secondary | ICD-10-CM | POA: Insufficient documentation

## 2013-04-29 DIAGNOSIS — F3289 Other specified depressive episodes: Secondary | ICD-10-CM | POA: Insufficient documentation

## 2013-04-29 DIAGNOSIS — Z8739 Personal history of other diseases of the musculoskeletal system and connective tissue: Secondary | ICD-10-CM | POA: Insufficient documentation

## 2013-04-29 DIAGNOSIS — K219 Gastro-esophageal reflux disease without esophagitis: Secondary | ICD-10-CM | POA: Insufficient documentation

## 2013-04-29 DIAGNOSIS — R509 Fever, unspecified: Secondary | ICD-10-CM | POA: Insufficient documentation

## 2013-04-29 DIAGNOSIS — Z87891 Personal history of nicotine dependence: Secondary | ICD-10-CM | POA: Insufficient documentation

## 2013-04-29 DIAGNOSIS — F329 Major depressive disorder, single episode, unspecified: Secondary | ICD-10-CM | POA: Insufficient documentation

## 2013-04-29 LAB — CBC WITH DIFFERENTIAL/PLATELET
Basophils Relative: 0 % (ref 0–1)
Eosinophils Relative: 0 % (ref 0–5)
HCT: 19.7 % — ABNORMAL LOW (ref 36.0–46.0)
Hemoglobin: 6.7 g/dL — CL (ref 12.0–15.0)
MCH: 29.5 pg (ref 26.0–34.0)
MCV: 86.8 fL (ref 78.0–100.0)
Monocytes Absolute: 0.5 10*3/uL (ref 0.1–1.0)
Neutrophils Relative %: 59 % (ref 43–77)
RBC: 2.27 MIL/uL — ABNORMAL LOW (ref 3.87–5.11)

## 2013-04-29 LAB — BASIC METABOLIC PANEL
CO2: 27 mEq/L (ref 19–32)
Chloride: 100 mEq/L (ref 96–112)
Creatinine, Ser: 0.61 mg/dL (ref 0.50–1.10)

## 2013-04-29 MED ORDER — SODIUM CHLORIDE 0.9 % IV SOLN
INTRAVENOUS | Status: DC
  Administered 2013-04-29: 18:00:00 via INTRAVENOUS

## 2013-04-29 MED ORDER — HEPARIN SOD (PORK) LOCK FLUSH 100 UNIT/ML IV SOLN
INTRAVENOUS | Status: AC
  Administered 2013-04-29: 500 [IU]
  Filled 2013-04-29: qty 5

## 2013-04-29 MED ORDER — OXYCODONE-ACETAMINOPHEN 5-325 MG PO TABS
1.0000 | ORAL_TABLET | Freq: Once | ORAL | Status: AC
  Administered 2013-04-29: 1 via ORAL
  Filled 2013-04-29: qty 1

## 2013-04-29 NOTE — ED Notes (Signed)
Bedside report received from previous RN, Tim. 

## 2013-04-29 NOTE — ED Notes (Signed)
Pt presents to ed with c/o headache, anemia and "being disoriented" Pt has hx of lung cancer and sts was supposed to come in yesterday for blood transfusion but had car problems. Pt sts was told by her doctor that she needs blood transfusion d/t "low blood count after chemotherapy".

## 2013-04-29 NOTE — ED Provider Notes (Signed)
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  Her last treatment was Wednesday.  She was told she was supposed to get a blood transfusion yesterday but was unable to make her appointment because her car broke down.  SHe has had trouble with her memory and intermittent confusion over the last few days.  She just feels like her mind is sluggish. She also has had intermittent episodes of chest pain but she has had those symptoms with her cancer diagnosis and her COPD.  She denies shortness of breath now.  She is not having any chest pain now and she is not feeling confused now.  She denies blood in her stool or any other bleeding.  Pt denies any new dysuria or frequency. She states she has had these symptoms since her chemotherapy and has had her urine tested before. Past Medical History  Diagnosis Date  . Hypertension   . Hypothyroidism   . Anxiety   . Depression   . GERD (gastroesophageal reflux disease)   . Cancer     non- small cell squamous  . Shortness of breath   . COPD (chronic obstructive pulmonary disease)   . Headache(784.0)   . Arthritis     Hx; of osteoarthritis  . Diverticulosis     Hx: of   Past Surgical History  Procedure Laterality Date  . Abdominal hysterectomy    . Inner ear surgery    . Right -sided thoracic outlet surgery      Hx: of  . Colonoscopy w/ biopsies and polypectomy      Hx: of  . Rectal fistula repair      Hx: of  . Hemorrhoid surgery      Hx: of  . Portacath placement Bilateral 02/16/2013    Procedure: INSERTION PORT-A-CATH;  Surgeon: Kerin Perna, MD;  Location: [facility] OR;  Service: Thoracic;  Laterality: Bilateral;   Family History  Problem Relation Age of Onset  . Diabetes Mellitus II Father   . Heart failure Sister    History  Substance Use Topics  . Smoking status: Former Smoker -- 1.00  packs/day for 20 years    Types: Cigarettes    Quit date: 12/31/2012  . Smokeless tobacco: Never Used  . Alcohol Use: No   OB History   Grav Para Term Preterm Abortions TAB SAB Ect Mult Living                 Review of Systems  Constitutional: Positive for fever.  Genitourinary: Negative for dysuria.  All other systems reviewed and are negative.    Allergies  Review of patient's allergies indicates no known allergies.  Home Medications   Current Outpatient Rx  Name  Route  Sig  Dispense  Refill  . ALPRAZolam (XANAX) 0.25 MG tablet   Oral   Take 1 tablet (0.25 mg total) by mouth 3 (three) times daily as needed for anxiety.   30 tablet   0   . atenolol (TENORMIN) 50 MG tablet   Oral   Take 75 mg by mouth 2 (two) times daily.         . calcium-vitamin D (OSCAL WITH D) 500-200 MG-UNIT per tablet   Oral   Take 1 tablet by mouth every morning.         Marland Kitchen  carisoprodol (SOMA) 350 MG tablet   Oral   Take 350 mg by mouth 4 (four) times daily as needed for muscle spasms.         Marland Kitchen docusate sodium (COLACE) 100 MG capsule   Oral   Take 100 mg by mouth as needed for constipation.         Marland Kitchen esomeprazole (NEXIUM) 40 MG capsule   Oral   Take 40 mg by mouth every other day.          . estradiol (ESTRACE) 2 MG tablet   Oral   Take 2 mg by mouth every morning.          . fexofenadine (ALLEGRA) 180 MG tablet   Oral   Take 180 mg by mouth daily as needed (alleriges).          Marland Kitchen FLUoxetine (PROZAC) 20 MG capsule   Oral   Take 20 mg by mouth every morning.          . fluticasone (FLONASE) 50 MCG/ACT nasal spray   Nasal   Place 2 sprays into the nose daily as needed for rhinitis or allergies.          Marland Kitchen lamoTRIgine (LAMICTAL) 100 MG tablet   Oral   Take 100 mg by mouth 2 (two) times daily.          Marland Kitchen levothyroxine (SYNTHROID, LEVOTHROID) 50 MCG tablet   Oral   Take 50 mcg by mouth daily before breakfast.         . lidocaine-prilocaine (EMLA)  cream   Topical   Apply 1 application topically as needed (to port site).   30 g   0   . lisinopril (PRINIVIL,ZESTRIL) 30 MG tablet   Oral   Take 30 mg by mouth daily.         . Multiple Vitamin (MULTIVITAMIN) tablet   Oral   Take 1 tablet by mouth daily.         . NONFORMULARY OR COMPOUNDED ITEM   Topical   Apply 1 application topically 3 (three) times daily as needed (for pain). Special compounded cream from [facility] in [place], [place] ; contains ketamine, ketoprofen, amitriptyline.         Marland Kitchen oxybutynin (DITROPAN-XL) 10 MG 24 hr tablet   Oral   Take 10 mg by mouth at bedtime.         Marland Kitchen oxycodone (ROXICODONE) 30 MG immediate release tablet   Oral   Take 30 mg by mouth every 4 (four) hours as needed for pain.         Marland Kitchen PROAIR HFA 108 (90 BASE) MCG/ACT inhaler   Inhalation   Inhale 2 puffs into the lungs every 4 (four) hours as needed for wheezing or shortness of breath.          . prochlorperazine (COMPAZINE) 10 MG tablet   Oral   Take 10 mg by mouth every 6 (six) hours as needed (nausea).         . prochlorperazine (COMPAZINE) 25 MG suppository   Rectal   Place 1 suppository (25 mg total) rectally every 12 (twelve) hours as needed for nausea.   12 suppository   0   . simvastatin (ZOCOR) 40 MG tablet   Oral   Take 40 mg by mouth every evening.          BP 120/75  Pulse 67  Temp(Src) 98.2 F (36.8 C) (Oral)  Resp 13  SpO2 99% Physical Exam  Nursing note and vitals reviewed. Constitutional: She is oriented to person, place, and time. No distress.  HENT:  Head: Normocephalic and atraumatic.  Right Ear: External ear normal.  Left Ear: External ear normal.  Mouth/Throat: No oropharyngeal exudate.  alopecia  Eyes: Conjunctivae are normal. Right eye exhibits no discharge. Left eye exhibits no discharge. No scleral icterus.  Neck: Neck supple. No tracheal deviation present.  Cardiovascular: Normal rate, regular rhythm and intact distal pulses.    Pulmonary/Chest: Effort normal and breath sounds normal. No stridor. No respiratory distress. She has no wheezes. She has no rales.  Abdominal: Soft. Bowel sounds are normal. She exhibits no distension. There is no tenderness. There is no rebound and no guarding.  Musculoskeletal: She exhibits no edema and no tenderness.  Neurological: She is alert and oriented to person, place, and time. She has normal strength. No sensory deficit. Cranial nerve deficit:  no gross defecits noted. She exhibits normal muscle tone. She displays no seizure activity. Coordination normal.  Skin: Skin is warm and dry. No rash noted.  Psychiatric: She has a normal mood and affect.    ED Course  Procedures (including critical care time) 1800  Labs revealed anemia.  Her hgb has dropped from her prior labs.  Pt is otherwise asymptomatic.  She would like to receive her blood transfusions and return home.  Will transfuse two units of PRBC and monitor. 2332  Tolerated transfusions well.  Pt is ready for discharge. Labs Review Labs Reviewed  CBC WITH DIFFERENTIAL - Abnormal; Notable for the following:    RBC 2.27 (*)    Hemoglobin 6.7 (*)    HCT 19.7 (*)    RDW 20.3 (*)    All other components within normal limits  BASIC METABOLIC PANEL - Abnormal; Notable for the following:    Glucose, Bld 113 (*)    All other components within normal limits   Imaging Review No results found.  MDM   1. Anemia   2. Weakness    The patient has anemia associated with her chemotherapy. She was given 2 units of blood here in emergent apartment. Patient is feeling much better. She will followup with her oncologist and understands return for any recurrent or worsening symptoms or    Celene Kras, MD 04/29/13 2333

## 2013-04-30 LAB — TYPE AND SCREEN
ABO/RH(D): O POS
Antibody Screen: NEGATIVE
Unit division: 0

## 2013-05-02 ENCOUNTER — Telehealth: Payer: Self-pay | Admitting: Internal Medicine

## 2013-05-02 ENCOUNTER — Other Ambulatory Visit (HOSPITAL_BASED_OUTPATIENT_CLINIC_OR_DEPARTMENT_OTHER): Payer: BC Managed Care – PPO | Admitting: Lab

## 2013-05-02 ENCOUNTER — Encounter: Payer: Self-pay | Admitting: Pharmacist

## 2013-05-02 ENCOUNTER — Telehealth: Payer: Self-pay | Admitting: *Deleted

## 2013-05-02 ENCOUNTER — Ambulatory Visit (HOSPITAL_BASED_OUTPATIENT_CLINIC_OR_DEPARTMENT_OTHER): Payer: BC Managed Care – PPO | Admitting: Physician Assistant

## 2013-05-02 ENCOUNTER — Encounter: Payer: Self-pay | Admitting: Physician Assistant

## 2013-05-02 ENCOUNTER — Ambulatory Visit (HOSPITAL_BASED_OUTPATIENT_CLINIC_OR_DEPARTMENT_OTHER): Payer: BC Managed Care – PPO

## 2013-05-02 VITALS — BP 139/59 | HR 76 | Temp 98.1°F | Resp 20 | Ht 65.0 in | Wt 160.8 lb

## 2013-05-02 DIAGNOSIS — M549 Dorsalgia, unspecified: Secondary | ICD-10-CM

## 2013-05-02 DIAGNOSIS — C3491 Malignant neoplasm of unspecified part of right bronchus or lung: Secondary | ICD-10-CM

## 2013-05-02 DIAGNOSIS — R0602 Shortness of breath: Secondary | ICD-10-CM

## 2013-05-02 DIAGNOSIS — R0789 Other chest pain: Secondary | ICD-10-CM

## 2013-05-02 DIAGNOSIS — C770 Secondary and unspecified malignant neoplasm of lymph nodes of head, face and neck: Secondary | ICD-10-CM

## 2013-05-02 DIAGNOSIS — D6481 Anemia due to antineoplastic chemotherapy: Secondary | ICD-10-CM

## 2013-05-02 DIAGNOSIS — C3490 Malignant neoplasm of unspecified part of unspecified bronchus or lung: Secondary | ICD-10-CM

## 2013-05-02 DIAGNOSIS — Z5111 Encounter for antineoplastic chemotherapy: Secondary | ICD-10-CM

## 2013-05-02 DIAGNOSIS — T451X5A Adverse effect of antineoplastic and immunosuppressive drugs, initial encounter: Secondary | ICD-10-CM

## 2013-05-02 LAB — COMPREHENSIVE METABOLIC PANEL (CC13)
BUN: 13 mg/dL (ref 7.0–26.0)
CO2: 26 mEq/L (ref 22–29)
Calcium: 9 mg/dL (ref 8.4–10.4)
Chloride: 104 mEq/L (ref 98–109)
Creatinine: 0.8 mg/dL (ref 0.6–1.1)

## 2013-05-02 LAB — CBC WITH DIFFERENTIAL/PLATELET
Basophils Absolute: 0 10*3/uL (ref 0.0–0.1)
Eosinophils Absolute: 0 10*3/uL (ref 0.0–0.5)
HCT: 29.5 % — ABNORMAL LOW (ref 34.8–46.6)
LYMPH%: 44.3 % (ref 14.0–49.7)
MONO#: 0.4 10*3/uL (ref 0.1–0.9)
NEUT#: 1.5 10*3/uL (ref 1.5–6.5)
NEUT%: 42.3 % (ref 38.4–76.8)
Platelets: 183 10*3/uL (ref 145–400)
WBC: 3.5 10*3/uL — ABNORMAL LOW (ref 3.9–10.3)

## 2013-05-02 MED ORDER — PACLITAXEL PROTEIN-BOUND CHEMO INJECTION 100 MG
90.0000 mg/m2 | Freq: Once | INTRAVENOUS | Status: AC
  Administered 2013-05-02: 175 mg via INTRAVENOUS
  Filled 2013-05-02: qty 35

## 2013-05-02 MED ORDER — DEXAMETHASONE SODIUM PHOSPHATE 10 MG/ML IJ SOLN
INTRAMUSCULAR | Status: AC
  Administered 2013-05-02: 10 mg via INTRAVENOUS
  Filled 2013-05-02: qty 1

## 2013-05-02 MED ORDER — HEPARIN SOD (PORK) LOCK FLUSH 100 UNIT/ML IV SOLN
500.0000 [IU] | Freq: Once | INTRAVENOUS | Status: AC | PRN
  Administered 2013-05-02: 500 [IU]
  Filled 2013-05-02: qty 5

## 2013-05-02 MED ORDER — ONDANSETRON 8 MG/NS 50 ML IVPB
INTRAVENOUS | Status: AC
  Filled 2013-05-02: qty 8

## 2013-05-02 MED ORDER — LIDOCAINE-PRILOCAINE 2.5-2.5 % EX CREA: 1.0000 "application " | TOPICAL_CREAM | CUTANEOUS | Status: DC | PRN

## 2013-05-02 MED ORDER — SODIUM CHLORIDE 0.9 % IJ SOLN
10.0000 mL | INTRAMUSCULAR | Status: DC | PRN
  Administered 2013-05-02: 10 mL
  Filled 2013-05-02: qty 10

## 2013-05-02 MED ORDER — DEXAMETHASONE SODIUM PHOSPHATE 10 MG/ML IJ SOLN
10.0000 mg | Freq: Once | INTRAMUSCULAR | Status: AC
  Administered 2013-05-02: 10 mg via INTRAVENOUS

## 2013-05-02 MED ORDER — OXYCODONE-ACETAMINOPHEN 5-325 MG PO TABS
2.0000 | ORAL_TABLET | Freq: Once | ORAL | Status: AC
  Administered 2013-05-02: 2 via ORAL

## 2013-05-02 MED ORDER — SODIUM CHLORIDE 0.9 % IV SOLN
Freq: Once | INTRAVENOUS | Status: AC
  Administered 2013-05-02: 15:00:00 via INTRAVENOUS

## 2013-05-02 MED ORDER — OXYCODONE-ACETAMINOPHEN 5-325 MG PO TABS
ORAL_TABLET | ORAL | Status: AC
  Filled 2013-05-02: qty 2

## 2013-05-02 MED ORDER — ONDANSETRON 8 MG/50ML IVPB (CHCC)
8.0000 mg | Freq: Once | INTRAVENOUS | Status: AC
  Administered 2013-05-02: 8 mg via INTRAVENOUS

## 2013-05-02 NOTE — Patient Instructions (Signed)
Continue with weekly labs and chemotherapy as scheduled You will receive your flu and Pneumovax vaccines today Followup in 3 weeks

## 2013-05-02 NOTE — Progress Notes (Signed)
Samples of Fusion Plus: 6 bottles of 4 caps each: lot # [id number]; exp 06/2014 Ebony Hail, Pharm.D., CPP 05/02/2013@2 :49 PM

## 2013-05-02 NOTE — Telephone Encounter (Signed)
gave pt appt for lab and MD, emailed Marcelino Duster regarding chemo appts for September 2014

## 2013-05-02 NOTE — Patient Instructions (Addendum)
[facility] Discharge Instructions for Patients Receiving Chemotherapy  Today you received the following chemotherapy agents: Abraxane   To help prevent nausea and vomiting after your treatment, we encourage you to take your nausea medication as needed.   If you develop nausea and vomiting that is not controlled by your nausea medication, call the clinic.   BELOW ARE SYMPTOMS THAT SHOULD BE REPORTED IMMEDIATELY:  *FEVER GREATER THAN 100.5 F  *CHILLS WITH OR WITHOUT FEVER  NAUSEA AND VOMITING THAT IS NOT CONTROLLED WITH YOUR NAUSEA MEDICATION  *UNUSUAL SHORTNESS OF BREATH  *UNUSUAL BRUISING OR BLEEDING  TENDERNESS IN MOUTH AND THROAT WITH OR WITHOUT PRESENCE OF ULCERS  *URINARY PROBLEMS  *BOWEL PROBLEMS  UNUSUAL RASH Items with * indicate a potential emergency and should be followed up as soon as possible.  Feel free to call the clinic you have any questions or concerns. The clinic phone number is [phone].    

## 2013-05-02 NOTE — Progress Notes (Addendum)
[facility] Telephone:([phone]) [phone]   [phone]  SHARED VISIT PROGRESS NOTE  WHITLOW, [name], MD [address] [place] [address]  DIAGNOSIS: Stage IV non-small cell lung cancer, squamous cell carcinoma diagnosed in June of 2014.   PRIOR THERAPY: None   CURRENT THERAPY: Systemic chemotherapy with carboplatin for AUC of 5 on day 1 and Abraxane 100 mg/M2 on days 1, 8 and 15 every 3 weeks. Status post 3 cycles as well day 1 and 8 of cycle 4   CHEMOTHERAPY INTENT: Palliative  CURRENT # OF CHEMOTHERAPY CYCLES: 4 CURRENT ANTIEMETICS: Zofran, dexamethasone, Compazine  CURRENT SMOKING STATUS: Former smoker, quit 12/31/2012  ORAL CHEMOTHERAPY AND CONSENT: n/a  CURRENT BISPHOSPHONATES USE: none  PAIN MANAGEMENT: oxycodone  NARCOTICS INDUCED CONSTIPATION: none  LIVING WILL AND CODE STATUS: Full code.   INTERVAL HISTORY: Dariel Pellecchia 52 y.o. female returns to the clinic today for followup visit accompanied her husband. Patient weight quest a flu and Pneumovax vaccine. She complains of increased shortness of breath as well as some chest discomfort which is exacerbated when her hemoglobin is low. She does productive cough of clear to 10 secretions although the secretions are occasionally yellow. This is not associated with any fever or chills. She complains of back pain. She usually takes her OxyIR 30 mg every 4 hours. She left her pain medication at home. She states that she had a rough night only sleeping approximately 3 hours and feels like this is exacerbated her back pain. She was initially placed on oxygen but Dr. Mellody Dance glands and is wondering if she needs to wear it all the time. She reports that she did receive her blood transfusion finally in the emergency room over the weekend as she and her husband's truck broke down and when they were on their way here there having some financial difficulties as he did not have a way to call us.  She tolerated the last dose of her  systemic chemotherapy with Abraxane fairly well. She denied having any significant nausea or vomiting. She has no fever or chills. She denied hemoptysis.   MEDICAL HISTORY: Past Medical History  Diagnosis Date  . Hypertension   . Hypothyroidism   . Anxiety   . Depression   . GERD (gastroesophageal reflux disease)   . Cancer     non- small cell squamous  . Shortness of breath   . COPD (chronic obstructive pulmonary disease)   . Headache(784.0)   . Arthritis     Hx; of osteoarthritis  . Diverticulosis     Hx: of    ALLERGIES:  has No Known Allergies.  MEDICATIONS:  Current Outpatient Prescriptions  Medication Sig Dispense Refill  . ALPRAZolam (XANAX) 0.25 MG tablet Take 1 tablet (0.25 mg total) by mouth 3 (three) times daily as needed for anxiety.  30 tablet  0  . atenolol (TENORMIN) 50 MG tablet Take 75 mg by mouth 2 (two) times daily.      . calcium-vitamin D (OSCAL WITH D) 500-200 MG-UNIT per tablet Take 1 tablet by mouth every morning.      . carisoprodol (SOMA) 350 MG tablet Take 350 mg by mouth 4 (four) times daily as needed for muscle spasms.      Marland Kitchen docusate sodium (COLACE) 100 MG capsule Take 100 mg by mouth as needed for constipation.      Marland Kitchen esomeprazole (NEXIUM) 40 MG capsule Take 40 mg by mouth every other day.       Marland Kitchen  estradiol (ESTRACE) 2 MG tablet Take 2 mg by mouth every morning.       . fexofenadine (ALLEGRA) 180 MG tablet Take 180 mg by mouth daily as needed (alleriges).       Marland Kitchen FLUoxetine (PROZAC) 20 MG capsule Take 20 mg by mouth every morning.       . fluticasone (FLONASE) 50 MCG/ACT nasal spray Place 2 sprays into the nose daily as needed for rhinitis or allergies.       Marland Kitchen lamoTRIgine (LAMICTAL) 100 MG tablet Take 100 mg by mouth 2 (two) times daily.       Marland Kitchen levothyroxine (SYNTHROID, LEVOTHROID) 50 MCG tablet Take 50 mcg by mouth daily before breakfast.      . lidocaine-prilocaine (EMLA) cream Apply 1 application topically as needed (to port site).  30 g  0   . lisinopril (PRINIVIL,ZESTRIL) 30 MG tablet Take 30 mg by mouth daily.      . Multiple Vitamin (MULTIVITAMIN) tablet Take 1 tablet by mouth daily.      . NONFORMULARY OR COMPOUNDED ITEM Apply 1 application topically 3 (three) times daily as needed (for pain). Special compounded cream from [facility] in [place], [place] ; contains ketamine, ketoprofen, amitriptyline.      Marland Kitchen oxybutynin (DITROPAN-XL) 10 MG 24 hr tablet Take 10 mg by mouth at bedtime.      Marland Kitchen oxycodone (ROXICODONE) 30 MG immediate release tablet Take 30 mg by mouth every 4 (four) hours as needed for pain.      Marland Kitchen PROAIR HFA 108 (90 BASE) MCG/ACT inhaler Inhale 2 puffs into the lungs every 4 (four) hours as needed for wheezing or shortness of breath.       . prochlorperazine (COMPAZINE) 10 MG tablet Take 10 mg by mouth every 6 (six) hours as needed (nausea).      . prochlorperazine (COMPAZINE) 25 MG suppository Place 1 suppository (25 mg total) rectally every 12 (twelve) hours as needed for nausea.  12 suppository  0  . simvastatin (ZOCOR) 40 MG tablet Take 40 mg by mouth every evening.       No current facility-administered medications for this visit.   Facility-Administered Medications Ordered in Other Visits  Medication Dose Route Frequency Provider Last Rate Last Dose  . ondansetron (ZOFRAN) 8 MG/50ML IVPB           . oxyCODONE-acetaminophen (PERCOCET/ROXICET) 5-325 MG per tablet           . sodium chloride 0.9 % injection 10 mL  10 mL Intracatheter PRN Si Gaul, MD   10 mL at 05/02/13 1630    SURGICAL HISTORY:  Past Surgical History  Procedure Laterality Date  . Abdominal hysterectomy    . Inner ear surgery    . Right -sided thoracic outlet surgery      Hx: of  . Colonoscopy w/ biopsies and polypectomy      Hx: of  . Rectal fistula repair      Hx: of  . Hemorrhoid surgery      Hx: of  . Portacath placement Bilateral 02/16/2013    Procedure: INSERTION PORT-A-CATH;  Surgeon: Kerin Perna, MD;  Location: [facility] OR;   Service: Thoracic;  Laterality: Bilateral;    REVIEW OF SYSTEMS:  Pertinent items are noted in HPI.   PHYSICAL EXAMINATION: General appearance: alert, cooperative, fatigued and no distress Head: Normocephalic, without obvious abnormality, atraumatic Neck: no adenopathy Lymph nodes: Cervical, supraclavicular, and axillary nodes normal. Resp: clear to auscultation bilaterally Cardio: regular rate and  rhythm, S1, S2 normal, no murmur, click, rub or gallop GI: soft, non-tender; bowel sounds normal; no masses,  no organomegaly Extremities: extremities normal, atraumatic, no cyanosis or edema Neurologic: Alert and oriented X 3, normal strength and tone. Normal symmetric reflexes. Normal coordination and gait  ECOG PERFORMANCE STATUS: 1 - Symptomatic but completely ambulatory  Blood pressure 139/59, pulse 76, temperature 98.1 F (36.7 C), temperature source Oral, resp. rate 20, height 5\' 5"  (1.651 m), weight 160 lb 12.8 oz (72.938 kg), SpO2 100.00%.  LABORATORY DATA: Lab Results  Component Value Date   WBC 3.5* 05/02/2013   HGB 9.8* 05/02/2013   HCT 29.5* 05/02/2013   MCV 88.6 05/02/2013   PLT 183 05/02/2013      Chemistry      Component Value Date/Time   NA 138 05/02/2013 1240   NA 136 04/29/2013 1737   K 4.0 05/02/2013 1240   K 3.8 04/29/2013 1737   CL 100 04/29/2013 1737   CL 96* 02/09/2013 1131   CO2 26 05/02/2013 1240   CO2 27 04/29/2013 1737   BUN 13.0 05/02/2013 1240   BUN 15 04/29/2013 1737   CREATININE 0.8 05/02/2013 1240   CREATININE 0.61 04/29/2013 1737      Component Value Date/Time   CALCIUM 9.0 05/02/2013 1240   CALCIUM 8.5 04/29/2013 1737   ALKPHOS 68 05/02/2013 1240   ALKPHOS 110 03/10/2013 1655   AST 15 05/02/2013 1240   AST 23 03/10/2013 1655   ALT 10 05/02/2013 1240   ALT 27 03/10/2013 1655   BILITOT 0.20 05/02/2013 1240   BILITOT 0.1* 03/10/2013 1655       RADIOGRAPHIC STUDIES: Ct Chest W Contrast  04/10/2013   *RADIOLOGY REPORT*  Clinical Data:  Restaging non-small cell lung cancer.   Chemotherapy ongoing.  The patient reports shortness of breath and cough.  CT CHEST, ABDOMEN AND PELVIS WITH CONTRAST  Technique:  Multidetector CT imaging of the chest, abdomen and pelvis was performed following the standard protocol during bolus administration of intravenous contrast.  Contrast: OMNIPAQUE IOHEXOL 300 MG/ML  SOLN  Comparison:  PET CT 02/02/2013.  Chest CT 01/14/2013.    CT CHEST  Findings:  There has been significant improvement in the previously demonstrated extensive mediastinal, hilar and supraclavicular lymphadenopathy.  Supraclavicular nodes measure 11 mm on the right (image #3) and 10 mm on the left (image #2), compared with the 20 and 18 mm respectively on the prior study. The largest remaining right paratracheal node measures 1.6 cm on image 19 (previously 2.9 cm).  Subcarinal node measures 2.1 cm on image 30 (previously 3.8 cm).  The bilateral pleural effusions have decreased in volume but do appear partially loculated.  No pleural nodularity is identified. There is no significant residual pericardial effusion.  Left subclavian Port-A-Cath tip is in the upper SVC.  There is mild coronary artery disease.  Biapical scarring and scattered mild emphysematous changes are stable.  There is overall improved aeration of the lung bases. However, there is new ill-defined peribronchial nodularity in the left upper lobe (images 20 - 25), likely inflammatory.  IMPRESSION:  1.  Significant interval improvement, mediastinal and hilar lymphadenopathy. 2.  Decreased size of bilateral pleural and pericardial effusions. 3.  Improved aeration of the lungs with mild peribronchial nodularity in the left upper lobe, likely inflammatory.    CT ABDOMEN AND PELVIS  Findings:  A 2.2 x 1.5 cm right adrenal nodule on image 56 is stable in size.  This was only mildly hypermetabolic  on PET CT. There is a stable tiny left adrenal nodule.  Inferiorly in the right hepatic lobe is a 2.0 cm low density lesion on  image 87.  This was not imaged on the prior chest CT.  There was no hypermetabolic activity in this area on the PET CT.  Based on the reformatted images which demonstrate peripheral discontinuous enhancement of this lesion, this is favored to reflect an incidental hemangioma.  No other liver lesions are identified.  The spleen, gallbladder, biliary system, pancreas and kidneys demonstrate no suspicious findings.  The previously demonstrated upper abdominal adenopathy has improved.  There is a 9 mm portacaval node on image 63.  No enlarged retroperitoneal or mesenteric lymph nodes are identified. There is stable aorto iliac atherosclerosis.  The stomach, small bowel and colon appear normal.  There is no adnexal mass status post hysterectomy.  There are no worrisome osseous findings.  IMPRESSION:  1.  Interval improvement in metastatic lymphadenopathy in the upper abdomen. 2.  Unchanged small bilateral adrenal nodules. Based on the stability and lack of significantly increased metabolic activity on prior PET CT, these are favored to reflect adenomas. 3.  Low density lesion inferiorly in the right hepatic lobe demonstrates peripheral discontinuous enhancement and is probably an incidental hemangioma.  There was no suspicious activity in this area on prior PET CT.   Original Report Authenticated By: Carey Bullocks, M.D.   ASSESSMENT AND PLAN: This is a very pleasant 52 years old white female with metastatic non-small cell lung cancer, squamous cell carcinoma currently undergoing systemic chemotherapy with carboplatin and Abraxane status post 3 cycles with significant improvement in her disease on the recent scan. She presents to proceed with day 15 of cycle 4. Patient was discussed with an also seen by Dr. Arbutus Ped. We will give her both the flu and Pneumovax vaccines today. To address her anemia we will start her on diffusion plus iron supplement 1 capsule daily. She requested a refill for her and what cream and this  was sent to her pharmacy of record via E. scribed. She will continue with her weekly labs and chemotherapy as scheduled. She'll followup in 3 weeks for another symptom management visit. Patient was encouraged bring her pain medication with her at all times. She will be given to Percocet to be taken by mouth today when she receives her chemotherapy to address her complaints of back pain.   Laural Benes, [place] E, PA-C   She was advised to call immediately if she has any concerning symptoms in the interval.  The patient voices understanding of current disease status and treatment options and is in agreement with the current care plan.  All questions were answered. The patient knows to call the clinic with any problems, questions or concerns. We can certainly see the patient much sooner if necessary.  ADDENDUM: Hematology/Oncology Attending: I have a face to face encounter with the patient. I recommended her care plan. This is a very pleasant 52 years old white female with metastatic non-small cell lung cancer, squamous cell carcinoma was currently undergoing systemic chemotherapy with carboplatin and Abraxane status post 4 cycles and she is currently undergoing cycle #5. She is tolerating her treatment fairly well except for the fatigue that mainly from chemotherapy-induced anemia. The patient will continue her chemotherapy today as scheduled. For the anemia I will start the patient on Fusion plus 1 capsule by mouth daily. She would come back for follow up visit in 3 weeks for evaluation and management any adverse  effect of her treatment. She was advised to call immediately if she has any concerning symptoms in the interval. Lajuana Matte., MD  05/02/2013

## 2013-05-02 NOTE — Telephone Encounter (Signed)
Per staff message and POF I have scheduled appts.  JMW  

## 2013-05-04 ENCOUNTER — Telehealth: Payer: Self-pay | Admitting: Internal Medicine

## 2013-05-04 NOTE — Telephone Encounter (Signed)
Called pt left message regarding appt for lab, MD and chemo for [employer]

## 2013-05-09 ENCOUNTER — Other Ambulatory Visit (HOSPITAL_BASED_OUTPATIENT_CLINIC_OR_DEPARTMENT_OTHER): Payer: BC Managed Care – PPO | Admitting: Lab

## 2013-05-09 ENCOUNTER — Other Ambulatory Visit: Payer: Self-pay | Admitting: Internal Medicine

## 2013-05-09 ENCOUNTER — Ambulatory Visit (HOSPITAL_BASED_OUTPATIENT_CLINIC_OR_DEPARTMENT_OTHER): Payer: BC Managed Care – PPO

## 2013-05-09 ENCOUNTER — Other Ambulatory Visit: Payer: Self-pay | Admitting: *Deleted

## 2013-05-09 DIAGNOSIS — C3491 Malignant neoplasm of unspecified part of right bronchus or lung: Secondary | ICD-10-CM

## 2013-05-09 DIAGNOSIS — C3490 Malignant neoplasm of unspecified part of unspecified bronchus or lung: Secondary | ICD-10-CM

## 2013-05-09 DIAGNOSIS — C770 Secondary and unspecified malignant neoplasm of lymph nodes of head, face and neck: Secondary | ICD-10-CM

## 2013-05-09 DIAGNOSIS — Z5111 Encounter for antineoplastic chemotherapy: Secondary | ICD-10-CM

## 2013-05-09 LAB — CBC WITH DIFFERENTIAL/PLATELET
BASO%: 0.3 % (ref 0.0–2.0)
Basophils Absolute: 0 10*3/uL (ref 0.0–0.1)
HCT: 27 % — ABNORMAL LOW (ref 34.8–46.6)
HGB: 9 g/dL — ABNORMAL LOW (ref 11.6–15.9)
MCHC: 33.3 g/dL (ref 31.5–36.0)
MONO#: 0.6 10*3/uL (ref 0.1–0.9)
NEUT%: 71 % (ref 38.4–76.8)
WBC: 6.6 10*3/uL (ref 3.9–10.3)
lymph#: 1.3 10*3/uL (ref 0.9–3.3)

## 2013-05-09 LAB — COMPREHENSIVE METABOLIC PANEL (CC13)
ALT: 10 U/L (ref 0–55)
Albumin: 3 g/dL — ABNORMAL LOW (ref 3.5–5.0)
CO2: 27 mEq/L (ref 22–29)
Calcium: 8.9 mg/dL (ref 8.4–10.4)
Chloride: 104 mEq/L (ref 98–109)
Creatinine: 0.7 mg/dL (ref 0.6–1.1)
Potassium: 4.2 mEq/L (ref 3.5–5.1)

## 2013-05-09 MED ORDER — DEXAMETHASONE SODIUM PHOSPHATE 20 MG/5ML IJ SOLN
INTRAMUSCULAR | Status: AC
  Filled 2013-05-09: qty 5

## 2013-05-09 MED ORDER — PROCHLORPERAZINE MALEATE 10 MG PO TABS: 10.0000 mg | ORAL_TABLET | Freq: Four times a day (QID) | ORAL | Status: AC | PRN

## 2013-05-09 MED ORDER — PACLITAXEL PROTEIN-BOUND CHEMO INJECTION 100 MG
90.0000 mg/m2 | Freq: Once | INTRAVENOUS | Status: AC
  Administered 2013-05-09: 175 mg via INTRAVENOUS
  Filled 2013-05-09: qty 35

## 2013-05-09 MED ORDER — DEXAMETHASONE SODIUM PHOSPHATE 20 MG/5ML IJ SOLN
20.0000 mg | Freq: Once | INTRAMUSCULAR | Status: AC
  Administered 2013-05-09: 20 mg via INTRAVENOUS

## 2013-05-09 MED ORDER — ONDANSETRON 16 MG/50ML IVPB (CHCC)
INTRAVENOUS | Status: AC
  Filled 2013-05-09: qty 16

## 2013-05-09 MED ORDER — HEPARIN SOD (PORK) LOCK FLUSH 100 UNIT/ML IV SOLN
500.0000 [IU] | Freq: Once | INTRAVENOUS | Status: AC | PRN
  Administered 2013-05-09: 500 [IU]
  Filled 2013-05-09: qty 5

## 2013-05-09 MED ORDER — SODIUM CHLORIDE 0.9 % IV SOLN
670.0000 mg | Freq: Once | INTRAVENOUS | Status: AC
  Administered 2013-05-09: 670 mg via INTRAVENOUS
  Filled 2013-05-09: qty 67

## 2013-05-09 MED ORDER — ONDANSETRON 16 MG/50ML IVPB (CHCC)
16.0000 mg | Freq: Once | INTRAVENOUS | Status: AC
  Administered 2013-05-09: 16 mg via INTRAVENOUS

## 2013-05-09 MED ORDER — SODIUM CHLORIDE 0.9 % IJ SOLN
10.0000 mL | INTRAMUSCULAR | Status: DC | PRN
  Administered 2013-05-09: 10 mL
  Filled 2013-05-09: qty 10

## 2013-05-09 MED ORDER — SODIUM CHLORIDE 0.9 % IV SOLN
Freq: Once | INTRAVENOUS | Status: AC
  Administered 2013-05-09: 16:00:00 via INTRAVENOUS

## 2013-05-09 NOTE — Patient Instructions (Addendum)
[facility] Discharge Instructions for Patients Receiving Chemotherapy  Today you received the following chemotherapy agents: Abraxane, Carboplatin. To help prevent nausea and vomiting after your treatment, we encourage you to take your nausea medication.   If you develop nausea and vomiting that is not controlled by your nausea medication, call the clinic.   BELOW ARE SYMPTOMS THAT SHOULD BE REPORTED IMMEDIATELY:  *FEVER GREATER THAN 100.5 F  *CHILLS WITH OR WITHOUT FEVER  NAUSEA AND VOMITING THAT IS NOT CONTROLLED WITH YOUR NAUSEA MEDICATION  *UNUSUAL SHORTNESS OF BREATH  *UNUSUAL BRUISING OR BLEEDING  TENDERNESS IN MOUTH AND THROAT WITH OR WITHOUT PRESENCE OF ULCERS  *URINARY PROBLEMS  *BOWEL PROBLEMS  UNUSUAL RASH Items with * indicate a potential emergency and should be followed up as soon as possible.  Feel free to call the clinic you have any questions or concerns. The clinic phone number is [phone].

## 2013-05-16 ENCOUNTER — Ambulatory Visit: Payer: BC Managed Care – PPO

## 2013-05-16 ENCOUNTER — Other Ambulatory Visit (HOSPITAL_BASED_OUTPATIENT_CLINIC_OR_DEPARTMENT_OTHER): Payer: BC Managed Care – PPO | Admitting: Lab

## 2013-05-16 DIAGNOSIS — C3491 Malignant neoplasm of unspecified part of right bronchus or lung: Secondary | ICD-10-CM

## 2013-05-16 DIAGNOSIS — C3490 Malignant neoplasm of unspecified part of unspecified bronchus or lung: Secondary | ICD-10-CM

## 2013-05-16 LAB — CBC WITH DIFFERENTIAL/PLATELET
BASO%: 0.3 % (ref 0.0–2.0)
EOS%: 0.9 % (ref 0.0–7.0)
HCT: 25.2 % — ABNORMAL LOW (ref 34.8–46.6)
MCHC: 33.3 g/dL (ref 31.5–36.0)
MONO#: 0.1 10*3/uL (ref 0.1–0.9)
NEUT%: 60.7 % (ref 38.4–76.8)
RBC: 2.78 10*6/uL — ABNORMAL LOW (ref 3.70–5.45)
RDW: 20.2 % — ABNORMAL HIGH (ref 11.2–14.5)
WBC: 3.4 10*3/uL — ABNORMAL LOW (ref 3.9–10.3)
lymph#: 1.2 10*3/uL (ref 0.9–3.3)
nRBC: 0 % (ref 0–0)

## 2013-05-16 NOTE — Progress Notes (Signed)
Dr. Arbutus Ped aware of labs today. Holding chemotherapy. Patient informed of labs and that we will not be treating her today. Patient states she has increased fatigue and SOB and is requesting a blood transfusion. VSS. Patient nauseated. Dr. Arbutus Ped has been informed of VS and that patient wants RBC's via Amy H, RN; per MD, no transfusion today. RN educated patient on when to call clinic, on how to take nausea medication, and thrombocytopenia precautions. Patient voiced understanding and was discharged to home in stable condition. Clayborn Heron, RN

## 2013-05-23 ENCOUNTER — Other Ambulatory Visit: Payer: Self-pay | Admitting: Medical Oncology

## 2013-05-23 ENCOUNTER — Encounter: Payer: Self-pay | Admitting: Internal Medicine

## 2013-05-23 ENCOUNTER — Other Ambulatory Visit (HOSPITAL_BASED_OUTPATIENT_CLINIC_OR_DEPARTMENT_OTHER): Payer: BC Managed Care – PPO | Admitting: Lab

## 2013-05-23 ENCOUNTER — Ambulatory Visit (HOSPITAL_BASED_OUTPATIENT_CLINIC_OR_DEPARTMENT_OTHER): Payer: BC Managed Care – PPO

## 2013-05-23 ENCOUNTER — Ambulatory Visit (HOSPITAL_BASED_OUTPATIENT_CLINIC_OR_DEPARTMENT_OTHER): Payer: BC Managed Care – PPO | Admitting: Internal Medicine

## 2013-05-23 ENCOUNTER — Ambulatory Visit: Payer: BC Managed Care – PPO | Admitting: Lab

## 2013-05-23 VITALS — BP 118/56 | HR 65 | Temp 97.3°F | Resp 18

## 2013-05-23 VITALS — BP 127/70 | HR 65 | Temp 98.1°F | Resp 19 | Ht 65.0 in | Wt 164.5 lb

## 2013-05-23 DIAGNOSIS — D649 Anemia, unspecified: Secondary | ICD-10-CM

## 2013-05-23 DIAGNOSIS — D696 Thrombocytopenia, unspecified: Secondary | ICD-10-CM

## 2013-05-23 DIAGNOSIS — R5381 Other malaise: Secondary | ICD-10-CM

## 2013-05-23 DIAGNOSIS — C3490 Malignant neoplasm of unspecified part of unspecified bronchus or lung: Secondary | ICD-10-CM

## 2013-05-23 DIAGNOSIS — C3491 Malignant neoplasm of unspecified part of right bronchus or lung: Secondary | ICD-10-CM

## 2013-05-23 DIAGNOSIS — G609 Hereditary and idiopathic neuropathy, unspecified: Secondary | ICD-10-CM

## 2013-05-23 LAB — COMPREHENSIVE METABOLIC PANEL (CC13)
Albumin: 2.9 g/dL — ABNORMAL LOW (ref 3.5–5.0)
CO2: 27 mEq/L (ref 22–29)
Glucose: 125 mg/dl (ref 70–140)
Potassium: 3.9 mEq/L (ref 3.5–5.1)
Sodium: 139 mEq/L (ref 136–145)
Total Bilirubin: 0.26 mg/dL (ref 0.20–1.20)
Total Protein: 6.4 g/dL (ref 6.4–8.3)

## 2013-05-23 LAB — CBC WITH DIFFERENTIAL/PLATELET
Basophils Absolute: 0 10*3/uL (ref 0.0–0.1)
Eosinophils Absolute: 0 10*3/uL (ref 0.0–0.5)
HCT: 21.7 % — ABNORMAL LOW (ref 34.8–46.6)
MCV: 92.7 fL (ref 79.5–101.0)
MONO%: 16.5 % — ABNORMAL HIGH (ref 0.0–14.0)
RDW: 20.4 % — ABNORMAL HIGH (ref 11.2–14.5)
WBC: 2.4 10*3/uL — ABNORMAL LOW (ref 3.9–10.3)
lymph#: 1.1 10*3/uL (ref 0.9–3.3)

## 2013-05-23 MED ORDER — ACETAMINOPHEN 325 MG PO TABS
650.0000 mg | ORAL_TABLET | Freq: Once | ORAL | Status: AC
  Administered 2013-05-23: 650 mg via ORAL

## 2013-05-23 MED ORDER — DIPHENHYDRAMINE HCL 25 MG PO CAPS
ORAL_CAPSULE | ORAL | Status: AC
  Filled 2013-05-23: qty 1

## 2013-05-23 MED ORDER — SODIUM CHLORIDE 0.9 % IJ SOLN
3.0000 mL | INTRAMUSCULAR | Status: DC | PRN
  Filled 2013-05-23: qty 10

## 2013-05-23 MED ORDER — HEPARIN SOD (PORK) LOCK FLUSH 100 UNIT/ML IV SOLN
500.0000 [IU] | Freq: Every day | INTRAVENOUS | Status: AC | PRN
  Administered 2013-05-23: 500 [IU]
  Filled 2013-05-23: qty 5

## 2013-05-23 MED ORDER — DIPHENHYDRAMINE HCL 25 MG PO CAPS
25.0000 mg | ORAL_CAPSULE | Freq: Once | ORAL | Status: AC
  Administered 2013-05-23: 25 mg via ORAL

## 2013-05-23 MED ORDER — SODIUM CHLORIDE 0.9 % IJ SOLN
10.0000 mL | INTRAMUSCULAR | Status: AC | PRN
  Administered 2013-05-23: 10 mL
  Filled 2013-05-23: qty 10

## 2013-05-23 MED ORDER — SODIUM CHLORIDE 0.9 % IV SOLN
250.0000 mL | Freq: Once | INTRAVENOUS | Status: AC
  Administered 2013-05-23: 250 mL via INTRAVENOUS

## 2013-05-23 MED ORDER — ACETAMINOPHEN 325 MG PO TABS
ORAL_TABLET | ORAL | Status: AC
  Filled 2013-05-23: qty 2

## 2013-05-23 MED ORDER — HEPARIN SOD (PORK) LOCK FLUSH 100 UNIT/ML IV SOLN
500.0000 [IU] | Freq: Every day | INTRAVENOUS | Status: DC | PRN
  Filled 2013-05-23: qty 5

## 2013-05-23 NOTE — Patient Instructions (Addendum)
Blood Transfusion Information WHAT IS A BLOOD TRANSFUSION? A transfusion is the replacement of blood or some of its parts. Blood is made up of multiple cells which provide different functions.  Red blood cells carry oxygen and are used for blood loss replacement.  White blood cells fight against infection.  Platelets control bleeding.  Plasma helps clot blood.  Other blood products are available for specialized needs, such as hemophilia or other clotting disorders. BEFORE THE TRANSFUSION  Who gives blood for transfusions?   You may be able to donate blood to be used at a later date on yourself (autologous donation).  Relatives can be asked to donate blood. This is generally not any safer than if you have received blood from a stranger. The same precautions are taken to ensure safety when a relative's blood is donated.  Healthy volunteers who are fully evaluated to make sure their blood is safe. This is blood bank blood. Transfusion therapy is the safest it has ever been in the practice of medicine. Before blood is taken from a donor, a complete history is taken to make sure that person has no history of diseases nor engages in risky social behavior (examples are intravenous drug use or sexual activity with multiple partners). The donor's travel history is screened to minimize risk of transmitting infections, such as malaria. The donated blood is tested for signs of infectious diseases, such as HIV and hepatitis. The blood is then tested to be sure it is compatible with you in order to minimize the chance of a transfusion reaction. If you or a relative donates blood, this is often done in anticipation of surgery and is not appropriate for emergency situations. It takes many days to process the donated blood. RISKS AND COMPLICATIONS Although transfusion therapy is very safe and saves many lives, the main dangers of transfusion include:   Getting an infectious disease.  Developing a  transfusion reaction. This is an allergic reaction to something in the blood you were given. Every precaution is taken to prevent this. The decision to have a blood transfusion has been considered carefully by your caregiver before blood is given. Blood is not given unless the benefits outweigh the risks. AFTER THE TRANSFUSION  Right after receiving a blood transfusion, you will usually feel much better and more energetic. This is especially true if your red blood cells have gotten low (anemic). The transfusion raises the level of the red blood cells which carry oxygen, and this usually causes an energy increase.  The nurse administering the transfusion will monitor you carefully for complications. HOME CARE INSTRUCTIONS  No special instructions are needed after a transfusion. You may find your energy is better. Speak with your caregiver about any limitations on activity for underlying diseases you may have. SEEK MEDICAL CARE IF:   Your condition is not improving after your transfusion.  You develop redness or irritation at the intravenous (IV) site. SEEK IMMEDIATE MEDICAL CARE IF:  Any of the following symptoms occur over the next 12 hours:  Shaking chills.  You have a temperature by mouth above 102 F (38.9 C), not controlled by medicine.  Chest, back, or muscle pain.  People around you feel you are not acting correctly or are confused.  Shortness of breath or difficulty breathing.  Dizziness and fainting.  You get a rash or develop hives.  You have a decrease in urine output.  Your urine turns a dark color or changes to pink, red, or brown. Any of the following   symptoms occur over the next 10 days:  You have a temperature by mouth above 102 F (38.9 C), not controlled by medicine.  Shortness of breath.  Weakness after normal activity.  The white part of the eye turns yellow (jaundice).  You have a decrease in the amount of urine or are urinating less often.  Your  urine turns a dark color or changes to pink, red, or brown. Document Released: 08/07/2000 Document Revised: 11/02/2011 Document Reviewed: 03/26/2008 [employer] Patient Information 2014 [employer].  

## 2013-05-23 NOTE — Addendum Note (Signed)
Addended by: Charma Igo on: 05/23/2013 02:50 PM   Modules accepted: SmartSet

## 2013-05-23 NOTE — Progress Notes (Signed)
[facility] [phone]   [phone]  OFFICE PROGRESS NOTE  WHITLOW, [name], MD [address] [place] [address]  DIAGNOSIS: Stage IV non-small cell lung cancer, squamous cell carcinoma diagnosed in June of 2014.   PRIOR THERAPY: None   CURRENT THERAPY: Systemic chemotherapy with carboplatin for AUC of 5 on day 1 and Abraxane 100 mg/M2 on days 1, 8 and 15 every 3 weeks. Status post 3 cycles as well day 1 and 8 of cycle 4   CHEMOTHERAPY INTENT: Palliative  CURRENT # OF CHEMOTHERAPY CYCLES: 4  CURRENT ANTIEMETICS: Zofran, dexamethasone, Compazine  CURRENT SMOKING STATUS: Former smoker, quit 12/31/2012  ORAL CHEMOTHERAPY AND CONSENT: n/a  CURRENT BISPHOSPHONATES USE: none  PAIN MANAGEMENT: oxycodone  NARCOTICS INDUCED CONSTIPATION: none  LIVING WILL AND CODE STATUS: Full code.   INTERVAL HISTORY: Jodi Shepherd 52 y.o. female returns to the clinic today for followup visit. The patient is complaining of increasing fatigue and weakness as well as fatigue in the lower extremities. She also has occasional chest pain and mild shortness breath increased with exertion with no cough or hemoptysis. The patient denied having any significant weight loss or night sweats. She continues to have mild peripheral neuropathy. The patient denied having any significant fever or chills. She has no nausea or vomiting.  MEDICAL HISTORY: Past Medical History  Diagnosis Date  . Hypertension   . Hypothyroidism   . Anxiety   . Depression   . GERD (gastroesophageal reflux disease)   . Cancer     non- small cell squamous  . Shortness of breath   . COPD (chronic obstructive pulmonary disease)   . Headache(784.0)   . Arthritis     Hx; of osteoarthritis  . Diverticulosis     Hx: of    ALLERGIES:  has No Known Allergies.  MEDICATIONS:  Current Outpatient Prescriptions  Medication Sig Dispense Refill  . ALPRAZolam (XANAX) 0.25 MG tablet Take 1 tablet (0.25 mg  total) by mouth 3 (three) times daily as needed for anxiety.  30 tablet  0  . atenolol (TENORMIN) 50 MG tablet Take 75 mg by mouth 2 (two) times daily.      . calcium-vitamin D (OSCAL WITH D) 500-200 MG-UNIT per tablet Take 1 tablet by mouth every morning.      . carisoprodol (SOMA) 350 MG tablet Take 350 mg by mouth 4 (four) times daily as needed for muscle spasms.      Marland Kitchen docusate sodium (COLACE) 100 MG capsule Take 100 mg by mouth as needed for constipation.      Marland Kitchen esomeprazole (NEXIUM) 40 MG capsule Take 40 mg by mouth every other day.       . estradiol (ESTRACE) 2 MG tablet Take 2 mg by mouth every morning.       . fexofenadine (ALLEGRA) 180 MG tablet Take 180 mg by mouth daily as needed (alleriges).       Marland Kitchen FLUoxetine (PROZAC) 20 MG capsule Take 20 mg by mouth every morning.       . fluticasone (FLONASE) 50 MCG/ACT nasal spray Place 2 sprays into the nose daily as needed for rhinitis or allergies.       Marland Kitchen lamoTRIgine (LAMICTAL) 100 MG tablet Take 100 mg by mouth 2 (two) times daily.       Marland Kitchen levothyroxine (SYNTHROID, LEVOTHROID) 50 MCG tablet Take 50 mcg by mouth daily before breakfast.      . lidocaine-prilocaine (EMLA) cream Apply 1  application topically as needed (to port site).  30 g  0  . lisinopril (PRINIVIL,ZESTRIL) 30 MG tablet Take 30 mg by mouth daily.      . Multiple Vitamin (MULTIVITAMIN) tablet Take 1 tablet by mouth daily.      . NONFORMULARY OR COMPOUNDED ITEM Apply 1 application topically 3 (three) times daily as needed (for pain). Special compounded cream from [facility] in [place], [place] ; contains ketamine, ketoprofen, amitriptyline.      Marland Kitchen oxybutynin (DITROPAN-XL) 10 MG 24 hr tablet Take 10 mg by mouth at bedtime.      Marland Kitchen oxycodone (ROXICODONE) 30 MG immediate release tablet Take 30 mg by mouth every 4 (four) hours as needed for pain.      Marland Kitchen PROAIR HFA 108 (90 BASE) MCG/ACT inhaler Inhale 2 puffs into the lungs every 4 (four) hours as needed for wheezing or shortness of  breath.       . prochlorperazine (COMPAZINE) 10 MG tablet Take 1 tablet (10 mg total) by mouth every 6 (six) hours as needed (nausea).  60 tablet  0  . prochlorperazine (COMPAZINE) 25 MG suppository Place 1 suppository (25 mg total) rectally every 12 (twelve) hours as needed for nausea.  12 suppository  0  . simvastatin (ZOCOR) 40 MG tablet Take 40 mg by mouth every evening.       No current facility-administered medications for this visit.    SURGICAL HISTORY:  Past Surgical History  Procedure Laterality Date  . Abdominal hysterectomy    . Inner ear surgery    . Right -sided thoracic outlet surgery      Hx: of  . Colonoscopy w/ biopsies and polypectomy      Hx: of  . Rectal fistula repair      Hx: of  . Hemorrhoid surgery      Hx: of  . Portacath placement Bilateral 02/16/2013    Procedure: INSERTION PORT-A-CATH;  Surgeon: Kerin Perna, MD;  Location: [facility] OR;  Service: Thoracic;  Laterality: Bilateral;    REVIEW OF SYSTEMS:  A comprehensive review of systems was negative except for: Constitutional: positive for fatigue Respiratory: positive for dyspnea on exertion and pleurisy/chest pain Musculoskeletal: positive for muscle weakness   PHYSICAL EXAMINATION: General appearance: alert, cooperative, fatigued and no distress Head: Normocephalic, without obvious abnormality, atraumatic Neck: no adenopathy, no JVD, supple, symmetrical, trachea midline and thyroid not enlarged, symmetric, no tenderness/mass/nodules Lymph nodes: Cervical, supraclavicular, and axillary nodes normal. Resp: clear to auscultation bilaterally Back: symmetric, no curvature. ROM normal. No CVA tenderness. Cardio: regular rate and rhythm, S1, S2 normal, no murmur, click, rub or gallop GI: soft, non-tender; bowel sounds normal; no masses,  no organomegaly Extremities: extremities normal, atraumatic, no cyanosis or edema  ECOG PERFORMANCE STATUS: 1 - Symptomatic but completely ambulatory  Blood pressure  127/70, pulse 65, temperature 98.1 F (36.7 C), temperature source Oral, resp. rate 19, height 5\' 5"  (1.651 m), weight 164 lb 8 oz (74.617 kg), SpO2 100.00%.  LABORATORY DATA: Lab Results  Component Value Date   WBC 2.4* 05/23/2013   HGB 7.2* 05/23/2013   HCT 21.7* 05/23/2013   MCV 92.7 05/23/2013   PLT 71* 05/23/2013      Chemistry      Component Value Date/Time   NA 137 05/09/2013 1335   NA 136 04/29/2013 1737   K 4.2 05/09/2013 1335   K 3.8 04/29/2013 1737   CL 100 04/29/2013 1737   CL 96* 02/09/2013 1131   CO2 27 05/09/2013  1335   CO2 27 04/29/2013 1737   BUN 19.5 05/09/2013 1335   BUN 15 04/29/2013 1737   CREATININE 0.7 05/09/2013 1335   CREATININE 0.61 04/29/2013 1737      Component Value Date/Time   CALCIUM 8.9 05/09/2013 1335   CALCIUM 8.5 04/29/2013 1737   ALKPHOS 70 05/09/2013 1335   ALKPHOS 110 03/10/2013 1655   AST 13 05/09/2013 1335   AST 23 03/10/2013 1655   ALT 10 05/09/2013 1335   ALT 27 03/10/2013 1655   BILITOT 0.22 05/09/2013 1335   BILITOT 0.1* 03/10/2013 1655       RADIOGRAPHIC STUDIES: No results found.  ASSESSMENT AND PLAN: This is a very pleasant 52 years old white female with metastatic non-small cell lung cancer, squamous cell carcinoma currently undergoing systemic chemotherapy with carboplatin and Abraxane status post 5 cycles but she missed day 8 in addition to day 15 today the first cycle secondary to anemia and thrombocytopenia. I recommended for the patient to receive 2 units of PRBCs transfusion this week. She'll come back for followup visit in one week to resume her treatment her count today as appropriate. She was advised to call immediately if she has any concerning symptoms in the interval. The patient voices understanding of current disease status and treatment options and is in agreement with the current care plan.  All questions were answered. The patient knows to call the clinic with any problems, questions or concerns. We can certainly see the patient much  sooner if necessary.

## 2013-05-23 NOTE — Progress Notes (Signed)
HAR done

## 2013-05-23 NOTE — Patient Instructions (Signed)
Followup visit in 2 weeks. Blood transfusion this week.

## 2013-05-23 NOTE — Progress Notes (Signed)
Quick Note:  Call patient with the result and arrange for 2 units of PRBCs. ______ 

## 2013-05-24 ENCOUNTER — Ambulatory Visit (HOSPITAL_BASED_OUTPATIENT_CLINIC_OR_DEPARTMENT_OTHER): Payer: BC Managed Care – PPO

## 2013-05-24 ENCOUNTER — Ambulatory Visit (HOSPITAL_COMMUNITY)
Admission: RE | Admit: 2013-05-24 | Discharge: 2013-05-24 | Disposition: A | Discharge status: Home / Self Care | Payer: BC Managed Care – PPO | Source: Ambulatory Visit | Attending: Internal Medicine | Admitting: Internal Medicine

## 2013-05-24 ENCOUNTER — Telehealth: Payer: Self-pay | Admitting: Internal Medicine

## 2013-05-24 ENCOUNTER — Telehealth: Payer: Self-pay | Admitting: *Deleted

## 2013-05-24 VITALS — BP 134/71 | HR 65 | Temp 97.6°F | Resp 20

## 2013-05-24 DIAGNOSIS — D6481 Anemia due to antineoplastic chemotherapy: Secondary | ICD-10-CM

## 2013-05-24 DIAGNOSIS — D649 Anemia, unspecified: Secondary | ICD-10-CM

## 2013-05-24 DIAGNOSIS — C3490 Malignant neoplasm of unspecified part of unspecified bronchus or lung: Secondary | ICD-10-CM

## 2013-05-24 MED ORDER — HEPARIN SOD (PORK) LOCK FLUSH 100 UNIT/ML IV SOLN
250.0000 [IU] | INTRAVENOUS | Status: DC | PRN
  Filled 2013-05-24: qty 5

## 2013-05-24 MED ORDER — ACETAMINOPHEN 325 MG PO TABS
ORAL_TABLET | ORAL | Status: AC
  Filled 2013-05-24: qty 2

## 2013-05-24 MED ORDER — SODIUM CHLORIDE 0.9 % IJ SOLN
10.0000 mL | INTRAMUSCULAR | Status: AC | PRN
  Administered 2013-05-24: 10 mL
  Filled 2013-05-24: qty 10

## 2013-05-24 MED ORDER — SODIUM CHLORIDE 0.9 % IV SOLN: 250.0000 mL | Freq: Once | INTRAVENOUS | Status: DC

## 2013-05-24 MED ORDER — DIPHENHYDRAMINE HCL 25 MG PO CAPS
25.0000 mg | ORAL_CAPSULE | Freq: Once | ORAL | Status: AC
  Administered 2013-05-24: 25 mg via ORAL

## 2013-05-24 MED ORDER — ACETAMINOPHEN 325 MG PO TABS
650.0000 mg | ORAL_TABLET | Freq: Once | ORAL | Status: AC
  Administered 2013-05-24: 650 mg via ORAL

## 2013-05-24 MED ORDER — DIPHENHYDRAMINE HCL 25 MG PO CAPS
ORAL_CAPSULE | ORAL | Status: AC
  Filled 2013-05-24: qty 1

## 2013-05-24 MED ORDER — HEPARIN SOD (PORK) LOCK FLUSH 100 UNIT/ML IV SOLN
500.0000 [IU] | Freq: Once | INTRAVENOUS | Status: AC
  Administered 2013-05-24: 500 [IU] via INTRAVENOUS
  Filled 2013-05-24: qty 5

## 2013-05-24 MED ORDER — SODIUM CHLORIDE 0.9 % IV SOLN
250.0000 mL | Freq: Once | INTRAVENOUS | Status: AC
  Administered 2013-05-24: 250 mL via INTRAVENOUS

## 2013-05-24 NOTE — Telephone Encounter (Signed)
Per staff message and POF I have scheduled appts.  JMW  

## 2013-05-24 NOTE — Telephone Encounter (Signed)
s.w. pt and advised on all appt.Marland KitchenMarland Kitchenpt will get appts at todays visit

## 2013-05-24 NOTE — Patient Instructions (Addendum)
Blood Transfusion Information WHAT IS A BLOOD TRANSFUSION? A transfusion is the replacement of blood or some of its parts. Blood is made up of multiple cells which provide different functions.  Red blood cells carry oxygen and are used for blood loss replacement.  White blood cells fight against infection.  Platelets control bleeding.  Plasma helps clot blood.  Other blood products are available for specialized needs, such as hemophilia or other clotting disorders. BEFORE THE TRANSFUSION  Who gives blood for transfusions?   You may be able to donate blood to be used at a later date on yourself (autologous donation).  Relatives can be asked to donate blood. This is generally not any safer than if you have received blood from a stranger. The same precautions are taken to ensure safety when a relative's blood is donated.  Healthy volunteers who are fully evaluated to make sure their blood is safe. This is blood bank blood. Transfusion therapy is the safest it has ever been in the practice of medicine. Before blood is taken from a donor, a complete history is taken to make sure that person has no history of diseases nor engages in risky social behavior (examples are intravenous drug use or sexual activity with multiple partners). The donor's travel history is screened to minimize risk of transmitting infections, such as malaria. The donated blood is tested for signs of infectious diseases, such as HIV and hepatitis. The blood is then tested to be sure it is compatible with you in order to minimize the chance of a transfusion reaction. If you or a relative donates blood, this is often done in anticipation of surgery and is not appropriate for emergency situations. It takes many days to process the donated blood. RISKS AND COMPLICATIONS Although transfusion therapy is very safe and saves many lives, the main dangers of transfusion include:   Getting an infectious disease.  Developing a  transfusion reaction. This is an allergic reaction to something in the blood you were given. Every precaution is taken to prevent this. The decision to have a blood transfusion has been considered carefully by your caregiver before blood is given. Blood is not given unless the benefits outweigh the risks. AFTER THE TRANSFUSION  Right after receiving a blood transfusion, you will usually feel much better and more energetic. This is especially true if your red blood cells have gotten low (anemic). The transfusion raises the level of the red blood cells which carry oxygen, and this usually causes an energy increase.  The nurse administering the transfusion will monitor you carefully for complications. HOME CARE INSTRUCTIONS  No special instructions are needed after a transfusion. You may find your energy is better. Speak with your caregiver about any limitations on activity for underlying diseases you may have. SEEK MEDICAL CARE IF:   Your condition is not improving after your transfusion.  You develop redness or irritation at the intravenous (IV) site. SEEK IMMEDIATE MEDICAL CARE IF:  Any of the following symptoms occur over the next 12 hours:  Shaking chills.  You have a temperature by mouth above 102 F (38.9 C), not controlled by medicine.  Chest, back, or muscle pain.  People around you feel you are not acting correctly or are confused.  Shortness of breath or difficulty breathing.  Dizziness and fainting.  You get a rash or develop hives.  You have a decrease in urine output.  Your urine turns a dark color or changes to pink, red, or brown. Any of the following   symptoms occur over the next 10 days:  You have a temperature by mouth above 102 F (38.9 C), not controlled by medicine.  Shortness of breath.  Weakness after normal activity.  The white part of the eye turns yellow (jaundice).  You have a decrease in the amount of urine or are urinating less often.  Your  urine turns a dark color or changes to pink, red, or brown. Document Released: 08/07/2000 Document Revised: 11/02/2011 Document Reviewed: 03/26/2008 [employer] Patient Information 2014 [employer].  

## 2013-05-25 ENCOUNTER — Telehealth: Payer: Self-pay | Admitting: *Deleted

## 2013-05-25 LAB — TYPE AND SCREEN
ABO/RH(D): O POS
Antibody Screen: NEGATIVE
Unit division: 0
Unit division: 0

## 2013-05-25 NOTE — Telephone Encounter (Signed)
VERBAL ORDER AND READ BACK TO DR.MOHAMED- PT. NEEDS TO GO TO THE EMERGENCY ROOM TO BE EVALUATED. NOTIFIED PT. SHE VOICES UNDERSTANDING.

## 2013-05-25 NOTE — Telephone Encounter (Signed)
CONSTANT MID ABDOMINAL PAIN IS AT A SCALE OF NINE. SHE IS ALSO HAVING "WAVES" OF NAUSEA WITH VOMITING X3. PT IS UNABLE TO KEEP ANYTHING DOWN. PT. IS HAVING DIARRHEA X3. THE STOOL IS DARK GREEN TO BLACK. PT. IS UNSURE WHEN SHE HAD A  NORMAL BOWEL MOVEMENT. PAIN, NAUSEA, DIARRHEA MEDICATIONS ARE NOT HELPING. NO FEVER. THIS NOTE TO DR.MOHAMED.

## 2013-05-30 ENCOUNTER — Ambulatory Visit (HOSPITAL_BASED_OUTPATIENT_CLINIC_OR_DEPARTMENT_OTHER): Payer: BC Managed Care – PPO

## 2013-05-30 ENCOUNTER — Other Ambulatory Visit (HOSPITAL_BASED_OUTPATIENT_CLINIC_OR_DEPARTMENT_OTHER): Payer: BC Managed Care – PPO | Admitting: Lab

## 2013-05-30 DIAGNOSIS — C3490 Malignant neoplasm of unspecified part of unspecified bronchus or lung: Secondary | ICD-10-CM

## 2013-05-30 DIAGNOSIS — C3491 Malignant neoplasm of unspecified part of right bronchus or lung: Secondary | ICD-10-CM

## 2013-05-30 DIAGNOSIS — Z5111 Encounter for antineoplastic chemotherapy: Secondary | ICD-10-CM

## 2013-05-30 DIAGNOSIS — Z23 Encounter for immunization: Secondary | ICD-10-CM

## 2013-05-30 DIAGNOSIS — C778 Secondary and unspecified malignant neoplasm of lymph nodes of multiple regions: Secondary | ICD-10-CM

## 2013-05-30 LAB — COMPREHENSIVE METABOLIC PANEL (CC13)
ALT: 10 U/L (ref 0–55)
AST: 11 U/L (ref 5–34)
Anion Gap: 8 mEq/L (ref 3–11)
CO2: 26 mEq/L (ref 22–29)
Creatinine: 0.7 mg/dL (ref 0.6–1.1)
Glucose: 95 mg/dl (ref 70–140)
Total Bilirubin: 0.3 mg/dL (ref 0.20–1.20)

## 2013-05-30 LAB — CBC WITH DIFFERENTIAL/PLATELET
BASO%: 0 % (ref 0.0–2.0)
EOS%: 0.5 % (ref 0.0–7.0)
Eosinophils Absolute: 0 10*3/uL (ref 0.0–0.5)
HCT: 31.3 % — ABNORMAL LOW (ref 34.8–46.6)
LYMPH%: 41.6 % (ref 14.0–49.7)
MCH: 30.8 pg (ref 25.1–34.0)
MCHC: 33.2 g/dL (ref 31.5–36.0)
MONO#: 0.4 10*3/uL (ref 0.1–0.9)
NEUT%: 47.4 % (ref 38.4–76.8)
Platelets: 100 10*3/uL — ABNORMAL LOW (ref 145–400)
WBC: 4.2 10*3/uL (ref 3.9–10.3)

## 2013-05-30 MED ORDER — PACLITAXEL PROTEIN-BOUND CHEMO INJECTION 100 MG
80.0000 mg/m2 | Freq: Once | INTRAVENOUS | Status: AC
  Administered 2013-05-30: 150 mg via INTRAVENOUS
  Filled 2013-05-30: qty 30

## 2013-05-30 MED ORDER — SODIUM CHLORIDE 0.9 % IV SOLN
591.2000 mg | Freq: Once | INTRAVENOUS | Status: AC
  Administered 2013-05-30: 590 mg via INTRAVENOUS
  Filled 2013-05-30: qty 59

## 2013-05-30 MED ORDER — DEXAMETHASONE SODIUM PHOSPHATE 20 MG/5ML IJ SOLN
INTRAMUSCULAR | Status: AC
  Filled 2013-05-30: qty 5

## 2013-05-30 MED ORDER — ONDANSETRON 16 MG/50ML IVPB (CHCC)
INTRAVENOUS | Status: AC
  Filled 2013-05-30: qty 16

## 2013-05-30 MED ORDER — INFLUENZA VAC SPLIT QUAD 0.5 ML IM SUSP
0.5000 mL | INTRAMUSCULAR | Status: AC
  Administered 2013-05-30: 0.5 mL via INTRAMUSCULAR
  Filled 2013-05-30: qty 0.5

## 2013-05-30 MED ORDER — HEPARIN SOD (PORK) LOCK FLUSH 100 UNIT/ML IV SOLN
500.0000 [IU] | Freq: Once | INTRAVENOUS | Status: AC | PRN
  Administered 2013-05-30: 500 [IU]
  Filled 2013-05-30: qty 5

## 2013-05-30 MED ORDER — SODIUM CHLORIDE 0.9 % IJ SOLN
10.0000 mL | INTRAMUSCULAR | Status: DC | PRN
Start: 2013-05-30 — End: 2013-05-30
  Administered 2013-05-30: 10 mL
  Filled 2013-05-30: qty 10

## 2013-05-30 MED ORDER — ONDANSETRON 16 MG/50ML IVPB (CHCC)
16.0000 mg | Freq: Once | INTRAVENOUS | Status: AC
  Administered 2013-05-30: 16 mg via INTRAVENOUS

## 2013-05-30 MED ORDER — SODIUM CHLORIDE 0.9 % IV SOLN
Freq: Once | INTRAVENOUS | Status: AC
  Administered 2013-05-30: 15:00:00 via INTRAVENOUS

## 2013-05-30 MED ORDER — DEXAMETHASONE SODIUM PHOSPHATE 20 MG/5ML IJ SOLN
20.0000 mg | Freq: Once | INTRAMUSCULAR | Status: AC
  Administered 2013-05-30: 20 mg via INTRAVENOUS

## 2013-05-30 NOTE — Patient Instructions (Addendum)
[facility] Discharge Instructions for Patients Receiving Chemotherapy  Today you received the following chemotherapy agents Abraxane and Carboplatin.   To help prevent nausea and vomiting after your treatment, we encourage you to take your nausea medication.   If you develop nausea and vomiting that is not controlled by your nausea medication, call the clinic.   BELOW ARE SYMPTOMS THAT SHOULD BE REPORTED IMMEDIATELY:  *FEVER GREATER THAN 100.5 F  *CHILLS WITH OR WITHOUT FEVER  NAUSEA AND VOMITING THAT IS NOT CONTROLLED WITH YOUR NAUSEA MEDICATION  *UNUSUAL SHORTNESS OF BREATH  *UNUSUAL BRUISING OR BLEEDING  TENDERNESS IN MOUTH AND THROAT WITH OR WITHOUT PRESENCE OF ULCERS  *URINARY PROBLEMS  *BOWEL PROBLEMS  UNUSUAL RASH Items with * indicate a potential emergency and should be followed up as soon as possible.  Feel free to call the clinic you have any questions or concerns. The clinic phone number is [phone].    

## 2013-06-05 ENCOUNTER — Other Ambulatory Visit: Payer: Self-pay | Admitting: Physician Assistant

## 2013-06-06 ENCOUNTER — Other Ambulatory Visit (HOSPITAL_BASED_OUTPATIENT_CLINIC_OR_DEPARTMENT_OTHER): Payer: BC Managed Care – PPO | Admitting: Lab

## 2013-06-06 ENCOUNTER — Ambulatory Visit (HOSPITAL_BASED_OUTPATIENT_CLINIC_OR_DEPARTMENT_OTHER): Payer: BC Managed Care – PPO | Admitting: Physician Assistant

## 2013-06-06 ENCOUNTER — Ambulatory Visit: Payer: BC Managed Care – PPO

## 2013-06-06 ENCOUNTER — Telehealth: Payer: Self-pay | Admitting: Internal Medicine

## 2013-06-06 ENCOUNTER — Encounter: Payer: Self-pay | Admitting: Physician Assistant

## 2013-06-06 VITALS — BP 136/61 | HR 56 | Temp 98.6°F | Resp 18 | Ht 65.0 in | Wt 160.0 lb

## 2013-06-06 DIAGNOSIS — C3490 Malignant neoplasm of unspecified part of unspecified bronchus or lung: Secondary | ICD-10-CM

## 2013-06-06 DIAGNOSIS — D696 Thrombocytopenia, unspecified: Secondary | ICD-10-CM

## 2013-06-06 DIAGNOSIS — D509 Iron deficiency anemia, unspecified: Secondary | ICD-10-CM

## 2013-06-06 DIAGNOSIS — C3491 Malignant neoplasm of unspecified part of right bronchus or lung: Secondary | ICD-10-CM

## 2013-06-06 DIAGNOSIS — D709 Neutropenia, unspecified: Secondary | ICD-10-CM

## 2013-06-06 DIAGNOSIS — C770 Secondary and unspecified malignant neoplasm of lymph nodes of head, face and neck: Secondary | ICD-10-CM

## 2013-06-06 LAB — CBC WITH DIFFERENTIAL/PLATELET
Basophils Absolute: 0 10*3/uL (ref 0.0–0.1)
EOS%: 0.4 % (ref 0.0–7.0)
Eosinophils Absolute: 0 10*3/uL (ref 0.0–0.5)
HGB: 9.2 g/dL — ABNORMAL LOW (ref 11.6–15.9)
MCH: 31.4 pg (ref 25.1–34.0)
MCV: 93.2 fL (ref 79.5–101.0)
MONO#: 0.2 10*3/uL (ref 0.1–0.9)
MONO%: 8.2 % (ref 0.0–14.0)
NEUT#: 0.8 10*3/uL — ABNORMAL LOW (ref 1.5–6.5)
RBC: 2.93 10*6/uL — ABNORMAL LOW (ref 3.70–5.45)
RDW: 17.5 % — ABNORMAL HIGH (ref 11.2–14.5)
WBC: 2.4 10*3/uL — ABNORMAL LOW (ref 3.9–10.3)
nRBC: 0 % (ref 0–0)

## 2013-06-06 LAB — COMPREHENSIVE METABOLIC PANEL (CC13)
ALT: 13 U/L (ref 0–55)
Albumin: 3.3 g/dL — ABNORMAL LOW (ref 3.5–5.0)
Anion Gap: 9 mEq/L (ref 3–11)
CO2: 23 mEq/L (ref 22–29)
Calcium: 9.8 mg/dL (ref 8.4–10.4)
Chloride: 104 mEq/L (ref 98–109)
Creatinine: 0.8 mg/dL (ref 0.6–1.1)
Glucose: 98 mg/dl (ref 70–140)
Potassium: 4.2 mEq/L (ref 3.5–5.1)
Sodium: 136 mEq/L (ref 136–145)
Total Protein: 6.8 g/dL (ref 6.4–8.3)

## 2013-06-06 MED ORDER — FUSION PLUS PO CAPS: 1.0000 | ORAL_CAPSULE | Freq: Every day | ORAL | Status: AC

## 2013-06-06 NOTE — Telephone Encounter (Signed)
gv andprinted appt sched and avs forpt for OCT.Marland Kitchenemailed MW to add tx....gv pt barium

## 2013-06-06 NOTE — Progress Notes (Addendum)
[facility] Telephone:([phone]   [phone]  SHARED VISIT PROGRESS NOTE  Shepherd, Jodi Hick., MD [address] [place] [address]  DIAGNOSIS: Stage IV non-small cell lung cancer, squamous cell carcinoma diagnosed in June of 2014.   PRIOR THERAPY: None   CURRENT THERAPY: Systemic chemotherapy with carboplatin for AUC of 5 on day 1 and Abraxane 100 mg/M2 on days 1, 8 and 15 every 3 weeks. Status post 5 cycles as well day 1 of cycle 6   CHEMOTHERAPY INTENT: Palliative  CURRENT # OF CHEMOTHERAPY CYCLES: 6 CURRENT ANTIEMETICS: Zofran, dexamethasone, Compazine  CURRENT SMOKING STATUS: Former smoker, quit 12/31/2012  ORAL CHEMOTHERAPY AND CONSENT: n/a  CURRENT BISPHOSPHONATES USE: none  PAIN MANAGEMENT: oxycodone  NARCOTICS INDUCED CONSTIPATION: none  LIVING WILL AND CODE STATUS: Full code.   INTERVAL HISTORY: Jodi Shepherd 52 y.o. female returns to the clinic today for followup visit and to proceed with day 8 of cycle #6. She reports that she had significant nausea and vomiting for approximately 2 weeks of that was particularly bad in the early morning hours. She states that this is to resolve on Saturday. She reports that her pain management physician who wrote her prescription for Zofran she believes 4 mg tablets. She completed the fusion plus samples and tolerated these well. If she is going to continue the fusion for she will need either further samples were prescription. She reports having difficulty sleeping. She wakes at 3 or 4 morning to the bathroom and is unable to go back to sleep. She also reports having very vivid dreams. She states she's been off of her oxygen for the past 2 weeks and has not needed her albuterol inhaler likely either. She states that she needs to see her dentist. She also states that she has never had a Pneumovax vaccination. She also has occasional chest pain and mild shortness breath increased with exertion with no cough or hemoptysis.  The patient denied having any significant weight loss or night sweats. She continues to have mild peripheral neuropathy. The patient denied having any significant fever or chills. She has no recent nausea or vomiting.  MEDICAL HISTORY: Past Medical History  Diagnosis Date  . Hypertension   . Hypothyroidism   . Anxiety   . Depression   . GERD (gastroesophageal reflux disease)   . Cancer     non- small cell squamous  . Shortness of breath   . COPD (chronic obstructive pulmonary disease)   . Headache(784.0)   . Arthritis     Hx; of osteoarthritis  . Diverticulosis     Hx: of    ALLERGIES:  has No Known Allergies.  MEDICATIONS:  Current Outpatient Prescriptions  Medication Sig Dispense Refill  . ALPRAZolam (XANAX) 0.25 MG tablet Take 1 tablet (0.25 mg total) by mouth 3 (three) times daily as needed for anxiety.  30 tablet  0  . atenolol (TENORMIN) 50 MG tablet Take 75 mg by mouth 2 (two) times daily.      . calcium-vitamin D (OSCAL WITH D) 500-200 MG-UNIT per tablet Take 1 tablet by mouth every morning.      . carisoprodol (SOMA) 350 MG tablet Take 350 mg by mouth 4 (four) times daily as needed for muscle spasms.      Marland Kitchen docusate sodium (COLACE) 100 MG capsule Take 100 mg by mouth as needed for constipation.      Marland Kitchen esomeprazole (NEXIUM) 40 MG capsule Take 40 mg by mouth every  other day.       . estradiol (ESTRACE) 2 MG tablet Take 2 mg by mouth every morning.       . fexofenadine (ALLEGRA) 180 MG tablet Take 180 mg by mouth daily as needed (alleriges).       Marland Kitchen FLUoxetine (PROZAC) 20 MG capsule Take 20 mg by mouth every morning.       . fluticasone (FLONASE) 50 MCG/ACT nasal spray Place 2 sprays into the nose daily as needed for rhinitis or allergies.       . Iron-FA-B Cmp-C-Biot-Probiotic (FUSION PLUS) CAPS Take 1 capsule by mouth daily.  30 capsule  3  . lamoTRIgine (LAMICTAL) 100 MG tablet Take 100 mg by mouth 2 (two) times daily.       Marland Kitchen levothyroxine (SYNTHROID, LEVOTHROID) 50  MCG tablet Take 50 mcg by mouth daily before breakfast.      . lidocaine-prilocaine (EMLA) cream Apply 1 application topically as needed (to port site).  30 g  0  . lisinopril (PRINIVIL,ZESTRIL) 30 MG tablet Take 30 mg by mouth daily.      . Multiple Vitamin (MULTIVITAMIN) tablet Take 1 tablet by mouth daily.      . NONFORMULARY OR COMPOUNDED ITEM Apply 1 application topically 3 (three) times daily as needed (for pain). Special compounded cream from [facility] in [place], [place] ; contains ketamine, ketoprofen, amitriptyline.      Marland Kitchen oxybutynin (DITROPAN-XL) 10 MG 24 hr tablet Take 10 mg by mouth at bedtime.      Marland Kitchen oxycodone (ROXICODONE) 30 MG immediate release tablet Take 30 mg by mouth every 4 (four) hours as needed for pain.      Marland Kitchen PROAIR HFA 108 (90 BASE) MCG/ACT inhaler Inhale 2 puffs into the lungs every 4 (four) hours as needed for wheezing or shortness of breath.       . prochlorperazine (COMPAZINE) 10 MG tablet Take 1 tablet (10 mg total) by mouth every 6 (six) hours as needed (nausea).  60 tablet  0  . prochlorperazine (COMPAZINE) 25 MG suppository Place 1 suppository (25 mg total) rectally every 12 (twelve) hours as needed for nausea.  12 suppository  0  . simvastatin (ZOCOR) 40 MG tablet Take 40 mg by mouth every evening.       No current facility-administered medications for this visit.    SURGICAL HISTORY:  Past Surgical History  Procedure Laterality Date  . Abdominal hysterectomy    . Inner ear surgery    . Right -sided thoracic outlet surgery      Hx: of  . Colonoscopy w/ biopsies and polypectomy      Hx: of  . Rectal fistula repair      Hx: of  . Hemorrhoid surgery      Hx: of  . Portacath placement Bilateral 02/16/2013    Procedure: INSERTION PORT-A-CATH;  Surgeon: Kerin Perna, MD;  Location: [facility] OR;  Service: Thoracic;  Laterality: Bilateral;    REVIEW OF SYSTEMS:  Pertinent items are noted in HPI.   PHYSICAL EXAMINATION: General appearance: alert, cooperative,  fatigued and no distress Head: Normocephalic, without obvious abnormality, atraumatic Neck: no adenopathy, no JVD, supple, symmetrical, trachea midline and thyroid not enlarged, symmetric, no tenderness/mass/nodules Lymph nodes: Cervical, supraclavicular, and axillary nodes normal. Resp: clear to auscultation bilaterally Back: symmetric, no curvature. ROM normal. No CVA tenderness. Cardio: regular rate and rhythm, S1, S2 normal, no murmur, click, rub or gallop GI: soft, non-tender; bowel sounds normal; no masses,  no organomegaly Extremities:  extremities normal, atraumatic, no cyanosis or edema  ECOG PERFORMANCE STATUS: 1 - Symptomatic but completely ambulatory  Blood pressure 136/61, pulse 56, temperature 98.6 F (37 C), temperature source Oral, resp. rate 18, height 5\' 5"  (1.651 m), weight 160 lb (72.576 kg), SpO2 99.00%.  LABORATORY DATA: Lab Results  Component Value Date   WBC 2.4* 06/06/2013   HGB 9.2* 06/06/2013   HCT 27.3* 06/06/2013   MCV 93.2 06/06/2013   PLT 143* 06/06/2013      Chemistry      Component Value Date/Time   NA 136 06/06/2013 1423   NA 136 04/29/2013 1737   K 4.2 06/06/2013 1423   K 3.8 04/29/2013 1737   CL 100 04/29/2013 1737   CL 96* 02/09/2013 1131   CO2 23 06/06/2013 1423   CO2 27 04/29/2013 1737   BUN 18.9 06/06/2013 1423   BUN 15 04/29/2013 1737   CREATININE 0.8 06/06/2013 1423   CREATININE 0.61 04/29/2013 1737      Component Value Date/Time   CALCIUM 9.8 06/06/2013 1423   CALCIUM 8.5 04/29/2013 1737   ALKPHOS 70 06/06/2013 1423   ALKPHOS 110 03/10/2013 1655   AST 18 06/06/2013 1423   AST 23 03/10/2013 1655   ALT 13 06/06/2013 1423   ALT 27 03/10/2013 1655   BILITOT 0.22 06/06/2013 1423   BILITOT 0.1* 03/10/2013 1655       RADIOGRAPHIC STUDIES: No results found.  ASSESSMENT AND PLAN: This is a very pleasant 52 years old white female with metastatic non-small cell lung cancer, squamous cell carcinoma currently undergoing systemic chemotherapy with  carboplatin and Abraxane status post 5 cycles but she missed a few days secondary to anemia and thrombocytopenia. She is neutropenic today with an ANC of 0.8 and will be unable to proceed with day 8 of cycle 6 today as scheduled. Patient was discussed with also seen by Dr. Arbutus Ped. We will have her return in one week for day 15 of cycle 6 of clear counts would've recovered and she can proceed with that schedule day of chemotherapy. We will have her scheduled for restaging CT scan of the chest, abdomen and pelvis with contrast to reevaluate her disease and returns in 3 weeks for another symptom management visit. A prescription for fusion plus was sent her pharmacy of record via E. scribed: Capsule by mouth daily of 30 with 3 refills. She is asked to postpone her dental visit for which she's completed her chemotherapy and restaging CT scan. Should her counts the in a therapeutic range we will plan to give her Pneumovax vaccination and she returns in one week for day 15 of cycle 6 her systemic chemotherapy with carboplatin and Abraxane.  Laural Benes, Elizabeth Paulsen E, PA-C  She was advised to call immediately if she has any concerning symptoms in the interval. The patient voices understanding of current disease status and treatment options and is in agreement with the current care plan.  All questions were answered. The patient knows to call the clinic with any problems, questions or concerns. We can certainly see the patient much sooner if necessary.  ADDENDUM: Hematology/Oncology Attending: I had a face to face encounter with the patient. I recommended her care plan. The patient is a pleasant 52 years old white female who was diagnosed with metastatic non-small cell lung cancer, squamous cell carcinoma and currently undergoing systemic chemotherapy with carboplatin and Abraxane. The patient missed a few days of her treatment because of neutropenia and thrombocytopenia. Her absolute neutrophil count is low  today. I ask  of the patient come back for follow up visit for day 15 of the 6 cycles. She would have repeat CT scan of the chest, abdomen and pelvis performed in 2 weeks for reevaluation of her disease. For iron deficiency anemia, the patient was given prescription for fusion plus. The patient will continue her pain medication as prescribed by her pain clinic. She was advised to call immediately if she has any concerning symptoms in the interval. Lajuana Matte., MD 06/10/2013

## 2013-06-07 ENCOUNTER — Telehealth: Payer: Self-pay | Admitting: *Deleted

## 2013-06-07 NOTE — Telephone Encounter (Signed)
Per staff message and POF I have scheduled appts.  JMW  

## 2013-06-08 NOTE — Patient Instructions (Signed)
Continue taking Fusion Plus, 1 capsule by mouth daily Keep scheduled appointments for labs and chemotherapy Follow up with Dr. Arbutus Ped in 3 weeks with a restaging CT scan of your chest, abdomen and pelvis to re-evaluate your disease

## 2013-06-13 ENCOUNTER — Ambulatory Visit (HOSPITAL_COMMUNITY)
Admission: RE | Admit: 2013-06-13 | Discharge: 2013-06-13 | Disposition: A | Discharge status: Home / Self Care | Payer: BC Managed Care – PPO | Source: Ambulatory Visit | Attending: Physician Assistant | Admitting: Physician Assistant

## 2013-06-13 ENCOUNTER — Other Ambulatory Visit (HOSPITAL_BASED_OUTPATIENT_CLINIC_OR_DEPARTMENT_OTHER): Payer: BC Managed Care – PPO | Admitting: Lab

## 2013-06-13 ENCOUNTER — Ambulatory Visit (HOSPITAL_BASED_OUTPATIENT_CLINIC_OR_DEPARTMENT_OTHER): Payer: BC Managed Care – PPO

## 2013-06-13 VITALS — BP 126/75 | HR 57 | Temp 97.1°F | Resp 16

## 2013-06-13 DIAGNOSIS — C3490 Malignant neoplasm of unspecified part of unspecified bronchus or lung: Secondary | ICD-10-CM | POA: Insufficient documentation

## 2013-06-13 DIAGNOSIS — Z79899 Other long term (current) drug therapy: Secondary | ICD-10-CM | POA: Insufficient documentation

## 2013-06-13 DIAGNOSIS — D1803 Hemangioma of intra-abdominal structures: Secondary | ICD-10-CM | POA: Insufficient documentation

## 2013-06-13 DIAGNOSIS — R911 Solitary pulmonary nodule: Secondary | ICD-10-CM | POA: Insufficient documentation

## 2013-06-13 DIAGNOSIS — E279 Disorder of adrenal gland, unspecified: Secondary | ICD-10-CM | POA: Insufficient documentation

## 2013-06-13 DIAGNOSIS — R599 Enlarged lymph nodes, unspecified: Secondary | ICD-10-CM | POA: Insufficient documentation

## 2013-06-13 DIAGNOSIS — J438 Other emphysema: Secondary | ICD-10-CM | POA: Insufficient documentation

## 2013-06-13 DIAGNOSIS — C3491 Malignant neoplasm of unspecified part of right bronchus or lung: Secondary | ICD-10-CM

## 2013-06-13 DIAGNOSIS — Z5111 Encounter for antineoplastic chemotherapy: Secondary | ICD-10-CM

## 2013-06-13 LAB — CBC WITH DIFFERENTIAL/PLATELET
BASO%: 0.3 % (ref 0.0–2.0)
Basophils Absolute: 0 10*3/uL (ref 0.0–0.1)
EOS%: 0 % (ref 0.0–7.0)
HCT: 27.7 % — ABNORMAL LOW (ref 34.8–46.6)
HGB: 9.3 g/dL — ABNORMAL LOW (ref 11.6–15.9)
LYMPH%: 40.9 % (ref 14.0–49.7)
MCH: 31.5 pg (ref 25.1–34.0)
MCHC: 33.6 g/dL (ref 31.5–36.0)
MCV: 93.9 fL (ref 79.5–101.0)
MONO%: 15.4 % — ABNORMAL HIGH (ref 0.0–14.0)
NEUT%: 43.4 % (ref 38.4–76.8)
Platelets: 145 10*3/uL (ref 145–400)
RBC: 2.95 10*6/uL — ABNORMAL LOW (ref 3.70–5.45)
WBC: 3 10*3/uL — ABNORMAL LOW (ref 3.9–10.3)
nRBC: 0 % (ref 0–0)

## 2013-06-13 LAB — COMPREHENSIVE METABOLIC PANEL (CC13)
AST: 15 U/L (ref 5–34)
Albumin: 3.4 g/dL — ABNORMAL LOW (ref 3.5–5.0)
Alkaline Phosphatase: 69 U/L (ref 40–150)
BUN: 10.4 mg/dL (ref 7.0–26.0)
Calcium: 9.7 mg/dL (ref 8.4–10.4)
Chloride: 105 mEq/L (ref 98–109)
Creatinine: 0.8 mg/dL (ref 0.6–1.1)
Glucose: 87 mg/dl (ref 70–140)
Potassium: 3.8 mEq/L (ref 3.5–5.1)
Total Bilirubin: 0.2 mg/dL (ref 0.20–1.20)

## 2013-06-13 MED ORDER — DEXAMETHASONE SODIUM PHOSPHATE 10 MG/ML IJ SOLN
10.0000 mg | Freq: Once | INTRAMUSCULAR | Status: AC
  Administered 2013-06-13: 10 mg via INTRAVENOUS

## 2013-06-13 MED ORDER — SODIUM CHLORIDE 0.9 % IJ SOLN
10.0000 mL | INTRAMUSCULAR | Status: DC | PRN
  Administered 2013-06-13: 10 mL
  Filled 2013-06-13: qty 10

## 2013-06-13 MED ORDER — ONDANSETRON 8 MG/NS 50 ML IVPB
INTRAVENOUS | Status: AC
  Filled 2013-06-13: qty 8

## 2013-06-13 MED ORDER — HEPARIN SOD (PORK) LOCK FLUSH 100 UNIT/ML IV SOLN
500.0000 [IU] | Freq: Once | INTRAVENOUS | Status: AC | PRN
  Administered 2013-06-13: 500 [IU]
  Filled 2013-06-13: qty 5

## 2013-06-13 MED ORDER — ONDANSETRON 8 MG/50ML IVPB (CHCC)
8.0000 mg | Freq: Once | INTRAVENOUS | Status: AC
  Administered 2013-06-13: 8 mg via INTRAVENOUS

## 2013-06-13 MED ORDER — PACLITAXEL PROTEIN-BOUND CHEMO INJECTION 100 MG
80.0000 mg/m2 | Freq: Once | INTRAVENOUS | Status: AC
  Administered 2013-06-13: 150 mg via INTRAVENOUS
  Filled 2013-06-13: qty 30

## 2013-06-13 MED ORDER — DEXAMETHASONE SODIUM PHOSPHATE 10 MG/ML IJ SOLN
INTRAMUSCULAR | Status: AC
  Filled 2013-06-13: qty 1

## 2013-06-13 MED ORDER — IOHEXOL 300 MG/ML  SOLN
100.0000 mL | Freq: Once | INTRAMUSCULAR | Status: AC | PRN
  Administered 2013-06-13: 100 mL via INTRAVENOUS

## 2013-06-13 MED ORDER — SODIUM CHLORIDE 0.9 % IV SOLN
Freq: Once | INTRAVENOUS | Status: AC
  Administered 2013-06-13: 16:00:00 via INTRAVENOUS

## 2013-06-13 NOTE — Patient Instructions (Signed)
[facility] Discharge Instructions for Patients Receiving Chemotherapy  Today you received the following chemotherapy agents Abraxane  To help prevent nausea and vomiting after your treatment, we encourage you to take your nausea medication as prescribed.   If you develop nausea and vomiting that is not controlled by your nausea medication, call the clinic.   BELOW ARE SYMPTOMS THAT SHOULD BE REPORTED IMMEDIATELY:  *FEVER GREATER THAN 100.5 F  *CHILLS WITH OR WITHOUT FEVER  NAUSEA AND VOMITING THAT IS NOT CONTROLLED WITH YOUR NAUSEA MEDICATION  *UNUSUAL SHORTNESS OF BREATH  *UNUSUAL BRUISING OR BLEEDING  TENDERNESS IN MOUTH AND THROAT WITH OR WITHOUT PRESENCE OF ULCERS  *URINARY PROBLEMS  *BOWEL PROBLEMS  UNUSUAL RASH Items with * indicate a potential emergency and should be followed up as soon as possible.  Feel free to call the clinic you have any questions or concerns. The clinic phone number is [phone].    

## 2013-06-13 NOTE — Progress Notes (Signed)
Ok to treat with ANC 1.3 per Tiana Loft, PA.

## 2013-06-20 ENCOUNTER — Ambulatory Visit (HOSPITAL_BASED_OUTPATIENT_CLINIC_OR_DEPARTMENT_OTHER): Payer: BC Managed Care – PPO | Admitting: Internal Medicine

## 2013-06-20 ENCOUNTER — Encounter: Payer: Self-pay | Admitting: Internal Medicine

## 2013-06-20 ENCOUNTER — Other Ambulatory Visit (HOSPITAL_BASED_OUTPATIENT_CLINIC_OR_DEPARTMENT_OTHER): Payer: BC Managed Care – PPO | Admitting: Lab

## 2013-06-20 ENCOUNTER — Ambulatory Visit: Payer: BC Managed Care – PPO

## 2013-06-20 ENCOUNTER — Telehealth: Payer: Self-pay | Admitting: Internal Medicine

## 2013-06-20 DIAGNOSIS — D709 Neutropenia, unspecified: Secondary | ICD-10-CM

## 2013-06-20 DIAGNOSIS — C3490 Malignant neoplasm of unspecified part of unspecified bronchus or lung: Secondary | ICD-10-CM

## 2013-06-20 DIAGNOSIS — D696 Thrombocytopenia, unspecified: Secondary | ICD-10-CM

## 2013-06-20 DIAGNOSIS — C3491 Malignant neoplasm of unspecified part of right bronchus or lung: Secondary | ICD-10-CM

## 2013-06-20 DIAGNOSIS — C770 Secondary and unspecified malignant neoplasm of lymph nodes of head, face and neck: Secondary | ICD-10-CM

## 2013-06-20 LAB — CBC WITH DIFFERENTIAL/PLATELET
BASO%: 0.5 % (ref 0.0–2.0)
Eosinophils Absolute: 0 10*3/uL (ref 0.0–0.5)
HGB: 8.4 g/dL — ABNORMAL LOW (ref 11.6–15.9)
LYMPH%: 49.9 % — ABNORMAL HIGH (ref 14.0–49.7)
MCV: 94.3 fL (ref 79.5–101.0)
MONO#: 0.3 10*3/uL (ref 0.1–0.9)
MONO%: 8.1 % (ref 0.0–14.0)
NEUT#: 1.6 10*3/uL (ref 1.5–6.5)
Platelets: 83 10*3/uL — ABNORMAL LOW (ref 145–400)
RBC: 2.62 10*6/uL — ABNORMAL LOW (ref 3.70–5.45)
WBC: 4 10*3/uL (ref 3.9–10.3)
lymph#: 2 10*3/uL (ref 0.9–3.3)
nRBC: 0 % (ref 0–0)

## 2013-06-20 NOTE — Progress Notes (Signed)
[facility] [phone]   [phone]  OFFICE PROGRESS NOTE  WHITLOW, [name], MD [address] [place] [address]  DIAGNOSIS: Stage IV non-small cell lung cancer, squamous cell carcinoma diagnosed in June of 2014.   PRIOR THERAPY: None   CURRENT THERAPY: Systemic chemotherapy with carboplatin for AUC of 5 on day 1 and Abraxane 100 mg/M2 on days 1, 8 and 15 every 3 weeks. Status post 5 cycles as well day 1 of cycle 6   CHEMOTHERAPY INTENT: Palliative  CURRENT # OF CHEMOTHERAPY CYCLES: 6  CURRENT ANTIEMETICS: Zofran, dexamethasone, Compazine  CURRENT SMOKING STATUS: Former smoker, quit 12/31/2012  ORAL CHEMOTHERAPY AND CONSENT: n/a  CURRENT BISPHOSPHONATES USE: none  PAIN MANAGEMENT: oxycodone  NARCOTICS INDUCED CONSTIPATION: none  LIVING WILL AND CODE STATUS: Full code.    INTERVAL HISTORY: Charlisha Market 52 y.o. female returns to the clinic today for follow up visit accompanied by her husband. The patient is feeling fine today with no specific complaints except for the fatigue and pain on the right side of her chest.she is currently on oxycodone for pain management. The patient denied having any significant cough or hemoptysis but has shortness breath with exertion. She tolerated the last cycle of her treatment fairly well except for the fatigue. The patient missed several doses of her chemotherapy secondary to neutropenia and thrombocytopenia. She had repeat CT scan of the chest, abdomen and pelvis performed recently and she is here for evaluation and discussion of her scan results.  MEDICAL HISTORY: Past Medical History  Diagnosis Date  . Hypertension   . Hypothyroidism   . Anxiety   . Depression   . GERD (gastroesophageal reflux disease)   . Cancer     non- small cell squamous  . Shortness of breath   . COPD (chronic obstructive pulmonary disease)   . Headache(784.0)   . Arthritis     Hx; of osteoarthritis  . Diverticulosis     Hx: of     ALLERGIES:  has No Known Allergies.  MEDICATIONS:  Current Outpatient Prescriptions  Medication Sig Dispense Refill  . ALPRAZolam (XANAX) 0.25 MG tablet Take 1 tablet (0.25 mg total) by mouth 3 (three) times daily as needed for anxiety.  30 tablet  0  . atenolol (TENORMIN) 50 MG tablet Take 75 mg by mouth 2 (two) times daily.      . calcium-vitamin D (OSCAL WITH D) 500-200 MG-UNIT per tablet Take 1 tablet by mouth every morning.      . carisoprodol (SOMA) 350 MG tablet Take 350 mg by mouth 4 (four) times daily as needed for muscle spasms.      Marland Kitchen docusate sodium (COLACE) 100 MG capsule Take 100 mg by mouth as needed for constipation.      Marland Kitchen esomeprazole (NEXIUM) 40 MG capsule Take 40 mg by mouth every other day.       . estradiol (ESTRACE) 2 MG tablet Take 2 mg by mouth every morning.       . fexofenadine (ALLEGRA) 180 MG tablet Take 180 mg by mouth daily as needed (alleriges).       Marland Kitchen FLUoxetine (PROZAC) 20 MG capsule Take 20 mg by mouth every morning.       . fluticasone (FLONASE) 50 MCG/ACT nasal spray Place 2 sprays into the nose daily as needed for rhinitis or allergies.       . Iron-FA-B Cmp-C-Biot-Probiotic (FUSION PLUS) CAPS Take 1 capsule by mouth daily.  30  capsule  3  . lamoTRIgine (LAMICTAL) 100 MG tablet Take 100 mg by mouth 2 (two) times daily.       Marland Kitchen levothyroxine (SYNTHROID, LEVOTHROID) 50 MCG tablet Take 50 mcg by mouth daily before breakfast.      . lidocaine-prilocaine (EMLA) cream Apply 1 application topically as needed (to port site).  30 g  0  . lisinopril (PRINIVIL,ZESTRIL) 30 MG tablet Take 30 mg by mouth daily.      . Multiple Vitamin (MULTIVITAMIN) tablet Take 1 tablet by mouth daily.      . NONFORMULARY OR COMPOUNDED ITEM Apply 1 application topically 3 (three) times daily as needed (for pain). Special compounded cream from [facility] in [place], [place] ; contains ketamine, ketoprofen, amitriptyline.      Marland Kitchen oxybutynin (DITROPAN-XL) 10 MG 24 hr tablet Take 10 mg  by mouth at bedtime.      Marland Kitchen oxycodone (ROXICODONE) 30 MG immediate release tablet Take 30 mg by mouth every 4 (four) hours as needed for pain.      Marland Kitchen PROAIR HFA 108 (90 BASE) MCG/ACT inhaler Inhale 2 puffs into the lungs every 4 (four) hours as needed for wheezing or shortness of breath.       . simvastatin (ZOCOR) 40 MG tablet Take 40 mg by mouth every evening.      . ondansetron (ZOFRAN) 8 MG tablet Take 8 mg by mouth every 8 (eight) hours as needed.      . prochlorperazine (COMPAZINE) 10 MG tablet Take 1 tablet (10 mg total) by mouth every 6 (six) hours as needed (nausea).  60 tablet  0  . prochlorperazine (COMPAZINE) 25 MG suppository Place 1 suppository (25 mg total) rectally every 12 (twelve) hours as needed for nausea.  12 suppository  0   No current facility-administered medications for this visit.    SURGICAL HISTORY:  Past Surgical History  Procedure Laterality Date  . Abdominal hysterectomy    . Inner ear surgery    . Right -sided thoracic outlet surgery      Hx: of  . Colonoscopy w/ biopsies and polypectomy      Hx: of  . Rectal fistula repair      Hx: of  . Hemorrhoid surgery      Hx: of  . Portacath placement Bilateral 02/16/2013    Procedure: INSERTION PORT-A-CATH;  Surgeon: Kerin Perna, MD;  Location: [facility] OR;  Service: Thoracic;  Laterality: Bilateral;    REVIEW OF SYSTEMS:  Constitutional: positive for fatigue Eyes: negative Ears, nose, mouth, throat, and face: negative Respiratory: positive for dyspnea on exertion and pleurisy/chest pain Cardiovascular: negative Gastrointestinal: negative Genitourinary:negative Integument/breast: negative Hematologic/lymphatic: negative Musculoskeletal:negative Neurological: negative Behavioral/Psych: negative Endocrine: negative Allergic/Immunologic: negative   PHYSICAL EXAMINATION: General appearance: alert, cooperative, fatigued and no distress Head: Normocephalic, without obvious abnormality, atraumatic Neck: no  adenopathy, no JVD, supple, symmetrical, trachea midline and thyroid not enlarged, symmetric, no tenderness/mass/nodules Lymph nodes: Cervical, supraclavicular, and axillary nodes normal. Resp: clear to auscultation bilaterally Back: symmetric, no curvature. ROM normal. No CVA tenderness. Cardio: regular rate and rhythm, S1, S2 normal, no murmur, click, rub or gallop GI: soft, non-tender; bowel sounds normal; no masses,  no organomegaly Extremities: extremities normal, atraumatic, no cyanosis or edema Neurologic: Alert and oriented X 3, normal strength and tone. Normal symmetric reflexes. Normal coordination and gait  ECOG PERFORMANCE STATUS: 1 - Symptomatic but completely ambulatory  Blood pressure 111/58, pulse 57, temperature 97.1 F (36.2 C), temperature source Oral, resp. rate 19,  height 5\' 5"  (1.651 m), weight 163 lb 9.6 oz (74.208 kg).  LABORATORY DATA: Lab Results  Component Value Date   WBC 4.0 06/20/2013   HGB 8.4* 06/20/2013   HCT 24.7* 06/20/2013   MCV 94.3 06/20/2013   PLT 83* 06/20/2013      Chemistry      Component Value Date/Time   NA 141 06/13/2013 1356   NA 136 04/29/2013 1737   K 3.8 06/13/2013 1356   K 3.8 04/29/2013 1737   CL 100 04/29/2013 1737   CL 96* 02/09/2013 1131   CO2 26 06/13/2013 1356   CO2 27 04/29/2013 1737   BUN 10.4 06/13/2013 1356   BUN 15 04/29/2013 1737   CREATININE 0.8 06/13/2013 1356   CREATININE 0.61 04/29/2013 1737      Component Value Date/Time   CALCIUM 9.7 06/13/2013 1356   CALCIUM 8.5 04/29/2013 1737   ALKPHOS 69 06/13/2013 1356   ALKPHOS 110 03/10/2013 1655   AST 15 06/13/2013 1356   AST 23 03/10/2013 1655   ALT 10 06/13/2013 1356   ALT 27 03/10/2013 1655   BILITOT 0.20 06/13/2013 1356   BILITOT 0.1* 03/10/2013 1655       RADIOGRAPHIC STUDIES: Ct Chest W Contrast  06/13/2013   CLINICAL DATA:  Lung cancer restaging. Non-small cell lung cancer. Chemotherapy ongoing.  EXAM: CT CHEST, ABDOMEN, AND PELVIS WITH CONTRAST  TECHNIQUE:  Multidetector CT imaging of the chest, abdomen and pelvis was performed following the standard protocol during bolus administration of intravenous contrast.  CONTRAST:  OMNIPAQUE IOHEXOL 300 MG/ML  SOLN  COMPARISON:  CT 04/10/2013, PET-CT 02/02/2013.  FINDINGS:   CT CHEST FINDINGS  Port in the right anterior chest wall. No axillary or supraclavicular lymphadenopathy. Right paratracheal lymph node measures 15 mm short axis compared 16 mm on prior. Subcarinal lymph node measures 17 mm compared to 22 mm on prior. Prevascular lymph nodes are also decreased in size with exemplary node measuring 6 mm (image 25) compared to 9 mm on prior. No new mediastinal adenopathy. No pericardial fluid.  Review of the lung parenchyma demonstrates no suspicious pulmonary nodules. There is mild medial atelectasis at the lung bases. Centrilobular emphysema and paraseptal emphysema in the upper lobes. A pleural-based nodule in the superior aspect of the right lower lobe measures 7 mm (image 29) is decreased from 8 mm on prior.    CT ABDOMEN AND PELVIS FINDINGS  Low-density lesion inferior right hepatic lobe is unchanged likely represents a hemangioma as previously described. Gallbladder is normal. The pancreas, spleen, kidneys are normal. Right adrenal nodule measuring 16 mm unchanged from 16 mm on prior remeasured. Smaller 10 mm left adrenal nodule is also unchanged. These nodules are have metabolic activity similar to the liver suggesting a benign lesions.  The stomach, small bowel, and cecum are normal.  Abdominal or is normal caliber. No retroperitoneal periportal lymphadenopathy.  Interval decrease in the periportal lymphadenopathy described on prior exams. No measurable adenopathy in the porta hepatis or retroperitoneum. No pelvic lymphadenopathy.  The bladder is normal. Post hysterectomy anatomy. Next are normal. Review of bone windows demonstrates no aggressive osseous lesions.    IMPRESSION: 1. Interval reduction in  size of mediastinal lymph nodes.  2. Small pleural-based nodule in the right upper lobe is unchanged.  3.  No evidence disease progression in thorax.  4. No evidence of metastatic adenopathy in the abdomen or pelvis. Reduction in periportal and retroperitoneal adenopathy.  5. Stable nodular enlargement adrenal glands likely represent adenomas.  6. Stable hepatic hemangioma.   Electronically Signed   By: Genevive Bi M.D.   On: 06/13/2013 18:38   ASSESSMENT AND PLAN: The patient is a pleasant 52 years old white female who was diagnosed with metastatic non-small cell lung cancer, squamous cell carcinoma completed 6 cycles of systemic chemotherapy with carboplatin and Abraxane. The patient missed a few days of her treatment because of neutropenia and thrombocytopenia.  Her recent CT scan of the chest, abdomen and pelvis showed no evidence for disease progression. I discussed the scan results with the patient and her husband. I gave the patient the option of observation and repeat CT scan of the chest, abdomen and pelvis in 2 months versus consideration of maintenance chemotherapy with single agent Abraxane 80 mg/M2 on days 1 and 8 every 3 weeks. The patient is so tired and would like to have a break off chemotherapy. I would see her back for follow up visit in 2 months with repeat scans For iron deficiency anemia, the patient was given prescription for fusion plus.  The patient will continue her pain medication as prescribed by her pain clinic.  She was advised to call immediately if she has any concerning symptoms in the interval. The patient voices understanding of current disease status and treatment options and is in agreement with the current care plan.  All questions were answered. The patient knows to call the clinic with any problems, questions or concerns. We can certainly see the patient much sooner if necessary.  I spent 15 minutes counseling the patient face to face. The total time spent  in the appointment was 25 minutes.

## 2013-06-20 NOTE — Telephone Encounter (Signed)
gv and printed appt sch3ed and avs for pt for Dec...gv pt barium

## 2013-06-20 NOTE — Patient Instructions (Signed)
Your scan showed no evidence for disease progression. Follow up visit in 2 months with repeat CT scan of the chest, abdomen and pelvis

## 2013-07-03 ENCOUNTER — Telehealth: Payer: Self-pay | Admitting: Pulmonary Disease

## 2013-07-03 NOTE — Telephone Encounter (Signed)
Error.Jodi Shepherd ° °

## 2013-07-06 ENCOUNTER — Institutional Professional Consult (permissible substitution): Payer: BC Managed Care – PPO | Admitting: Pulmonary Disease

## 2013-07-12 ENCOUNTER — Encounter: Payer: Self-pay | Admitting: Internal Medicine

## 2013-07-12 ENCOUNTER — Ambulatory Visit (INDEPENDENT_AMBULATORY_CARE_PROVIDER_SITE_OTHER): Payer: BC Managed Care – PPO | Admitting: Internal Medicine

## 2013-07-12 ENCOUNTER — Ambulatory Visit (INDEPENDENT_AMBULATORY_CARE_PROVIDER_SITE_OTHER)
Admission: RE | Admit: 2013-07-12 | Discharge: 2013-07-12 | Disposition: A | Discharge status: Home / Self Care | Payer: BC Managed Care – PPO | Source: Ambulatory Visit | Attending: Internal Medicine | Admitting: Internal Medicine

## 2013-07-12 VITALS — BP 104/70 | HR 50 | Temp 98.3°F | Ht 65.5 in | Wt 165.6 lb

## 2013-07-12 DIAGNOSIS — I10 Essential (primary) hypertension: Secondary | ICD-10-CM

## 2013-07-12 DIAGNOSIS — J90 Pleural effusion, not elsewhere classified: Secondary | ICD-10-CM

## 2013-07-12 DIAGNOSIS — R0600 Dyspnea, unspecified: Secondary | ICD-10-CM | POA: Insufficient documentation

## 2013-07-12 DIAGNOSIS — R0609 Other forms of dyspnea: Secondary | ICD-10-CM

## 2013-07-12 MED ORDER — OLMESARTAN MEDOXOMIL 20 MG PO TABS: 20.0000 mg | ORAL_TABLET | Freq: Every day | ORAL | Status: DC

## 2013-07-12 MED ORDER — ATENOLOL 50 MG PO TABS: ORAL_TABLET | ORAL | Status: DC

## 2013-07-12 MED ORDER — ESOMEPRAZOLE MAGNESIUM 40 MG PO CPDR: DELAYED_RELEASE_CAPSULE | ORAL | Status: AC

## 2013-07-12 NOTE — Assessment & Plan Note (Signed)
ACE inhibitors are problematic in  pts with airway complaints because  even experienced pulmonologists can't always distinguish ace effects from copd/asthma.  By themselves they don't actually cause a problem, much like oxygen can't by itself start a fire, but they certainly serve as a powerful catalyst or enhancer for any "fire"  or inflammatory process in the upper airway, be it caused by an ET  tube or more commonly reflux (especially in the obese or pts with known GERD or who are on biphoshonates).    In the era of ARB near equivalency until we have a better handle on the reversibility of the airway problem, it just makes sense to avoid ACEI  entirely in the short run and then decide later, having established a level of airway control using a reasonable limited regimen, whether to add back ace but even then being very careful to observe the pt for worsening airway control and number of meds used/ needed to control symptoms.    Will try off acei and on benicar   See instructions for specific recommendations which were reviewed directly with the patient who was given a copy with highlighter outlining the key components.

## 2013-07-12 NOTE — Assessment & Plan Note (Addendum)
Symptoms are markedly disproportionate to objective findings and not clear this is a lung problem but pt does appear to have difficult airway management issues.   DDX of  difficult airways managment all start with A and  include Adherence, Ace Inhibitors, Acid Reflux, Active Sinus Disease, Alpha 1 Antitripsin deficiency, Anxiety masquerading as Airways dz,  ABPA,  allergy(esp in young), Aspiration (esp in elderly), Adverse effects of DPI,  Active smokers, plus two Bs  = Bronchiectasis and Beta blocker use..and one C= CHF  ? Acid (or non-acid) GERD > always difficult to exclude as up to 75% of pts in some series report no assoc GI/ Heartburn symptoms> rec max (24h)  acid suppression and diet restrictions/ reviewed and instructions given in writting   ? acei effects (though note absence of cough) > try off   ? Beta blocker effect > try lower dose of tenormin   ? Anxiety > dx of exclusion but note very large chronic narcotic dependency     Each maintenance medication was reviewed in detail including most importantly the difference between maintenance and as needed and under what circumstances the prns are to be used.  Please see instructions for details which were reviewed in writing and the patient given a copy.

## 2013-07-12 NOTE — Progress Notes (Signed)
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 No effect by eating. Can do HT but mall would be a stretch due to doe.  On nexium 40 mg qod   No better on saba on high dose tenormin   Takes oxycodone x 8 years gradually more but no change since onset of cp = 5 x 30 mg daily   No obvious day to day or daytime variabilty or assoc chronic cough or cp or chest tightness, subjective wheeze overt sinus or hb symptoms. No unusual exp hx or h/o childhood pna/ asthma or knowledge of premature birth.  Sleeping ok without nocturnal  or early am exacerbation  of respiratory  c/o's or need for noct saba. Also denies any obvious fluctuation of symptoms with weather or environmental changes or other aggravating or alleviating factors except as outlined above   Current Medications, Allergies, Complete Past Medical History, Past Surgical History, Family History, and Social History were reviewed in [employer] record.          Review of Systems  Constitutional: Negative for fever, chills and unexpected weight change.  HENT: Positive for dental problem. Negative for congestion, ear pain, nosebleeds, postnasal drip, rhinorrhea, sinus pressure, sneezing, sore throat, trouble swallowing and voice change.   Eyes: Negative for visual disturbance.  Respiratory: Positive for shortness of breath. Negative for cough and choking.   Cardiovascular: Positive for chest pain. Negative for leg swelling.  Gastrointestinal: Negative for vomiting, abdominal pain and diarrhea.  Genitourinary: Negative for  difficulty urinating.  Musculoskeletal: Positive for arthralgias.  Skin: Negative for rash.  Neurological: Negative for tremors, syncope and headaches.  Hematological: Does not bruise/bleed easily.       Objective:   Physical Exam  amb wf nad   Wt Readings from Last 3 Encounters:  07/12/13 165 lb 9.6 oz (75.116 kg)  06/20/13 163 lb 9.6 oz (74.208 kg)  06/06/13 160 lb (72.576 kg)     HEENT: nl dentition, turbinates, and orophanx. Nl external ear canals without cough reflex   NECK :  without JVD/Nodes/TM/ nl carotid upstrokes bilaterally   LUNGS: no acc muscle use, clear to A and P bilaterally without cough on insp or exp maneuvers   CV:  RRR  no s3 or murmur or increase in P2, no edema   ABD:  soft and nontender with nl excursion in the supine position. No bruits or organomegaly, bowel sounds nl  MS:  warm without deformities, calf tenderness, cyanosis or clubbing  SKIN: warm and dry without lesions    NEURO:  alert, approp, no deficits    CT with contrast 06/06/13 1. Interval reduction in size of mediastinal lymph nodes.  2. Small pleural-based nodule in the right upper lobe is unchanged.  3. No evidence disease progression in thorax.  4. No evidence of metastatic adenopathy in the abdomen or pelvis.  Reduction in periportal and retroperitoneal adenopathy.  5. Stable nodular enlargement adrenal glands likely represent  adenomas.  6. Stable hepatic hemangioma.  CXR  07/12/2013 :  No acute cardiopulmonary abnormality seen.  No effusions at all     Assessment & Plan:

## 2013-07-12 NOTE — Patient Instructions (Addendum)
Stop lisinopril Start benicar 20 mg one daily  Reduce Tenormin to 50 mg daily  Nexium Take 30-60 min before first meal of the day and Pepcid 20 mg one at bedtime automatically   GERD (REFLUX)  is an extremely common cause of respiratory symptoms, many times with no significant heartburn at all.    It can be treated with medication, but also with lifestyle changes including avoidance of late meals, excessive alcohol, smoking cessation, and avoid fatty foods, chocolate, peppermint, colas, red wine, and acidic juices such as orange juice.  NO MINT OR MENTHOL PRODUCTS SO NO COUGH DROPS  USE SUGARLESS CANDY INSTEAD (jolley ranchers or Stover's)  NO OIL BASED VITAMINS - use powdered substitutes.  Please remember to go to the x-ray department downstairs for your tests - we will call you with the results when they are available.    Please schedule a follow up office visit in 2  weeks, sooner if needed

## 2013-07-12 NOTE — Assessment & Plan Note (Signed)
No evidence of recurrence, will reassure

## 2013-07-13 NOTE — Progress Notes (Signed)
Quick Note:  LMTCB ______ 

## 2013-07-14 NOTE — Progress Notes (Signed)
Quick Note:  Spoke with pt and notified of results per Dr. Wert. Pt verbalized understanding and denied any questions.  ______ 

## 2013-07-26 ENCOUNTER — Ambulatory Visit: Payer: BC Managed Care – PPO | Admitting: Internal Medicine

## 2013-08-21 ENCOUNTER — Encounter (HOSPITAL_COMMUNITY): Payer: Self-pay

## 2013-08-21 ENCOUNTER — Other Ambulatory Visit (HOSPITAL_BASED_OUTPATIENT_CLINIC_OR_DEPARTMENT_OTHER): Payer: BC Managed Care – PPO

## 2013-08-21 ENCOUNTER — Telehealth: Payer: Self-pay | Admitting: *Deleted

## 2013-08-21 ENCOUNTER — Ambulatory Visit (HOSPITAL_COMMUNITY)
Admission: RE | Admit: 2013-08-21 | Discharge: 2013-08-21 | Disposition: A | Discharge status: Home / Self Care | Payer: BC Managed Care – PPO | Source: Ambulatory Visit | Attending: Internal Medicine | Admitting: Internal Medicine

## 2013-08-21 DIAGNOSIS — Z9221 Personal history of antineoplastic chemotherapy: Secondary | ICD-10-CM | POA: Insufficient documentation

## 2013-08-21 DIAGNOSIS — D696 Thrombocytopenia, unspecified: Secondary | ICD-10-CM

## 2013-08-21 DIAGNOSIS — R079 Chest pain, unspecified: Secondary | ICD-10-CM | POA: Insufficient documentation

## 2013-08-21 DIAGNOSIS — R0602 Shortness of breath: Secondary | ICD-10-CM | POA: Insufficient documentation

## 2013-08-21 DIAGNOSIS — I708 Atherosclerosis of other arteries: Secondary | ICD-10-CM | POA: Insufficient documentation

## 2013-08-21 DIAGNOSIS — K7689 Other specified diseases of liver: Secondary | ICD-10-CM | POA: Insufficient documentation

## 2013-08-21 DIAGNOSIS — C3490 Malignant neoplasm of unspecified part of unspecified bronchus or lung: Secondary | ICD-10-CM

## 2013-08-21 DIAGNOSIS — D709 Neutropenia, unspecified: Secondary | ICD-10-CM

## 2013-08-21 DIAGNOSIS — E278 Other specified disorders of adrenal gland: Secondary | ICD-10-CM | POA: Insufficient documentation

## 2013-08-21 DIAGNOSIS — R599 Enlarged lymph nodes, unspecified: Secondary | ICD-10-CM | POA: Insufficient documentation

## 2013-08-21 LAB — CBC WITH DIFFERENTIAL/PLATELET
BASO%: 0.7 % (ref 0.0–2.0)
Basophils Absolute: 0 10*3/uL (ref 0.0–0.1)
HCT: 35.7 % (ref 34.8–46.6)
HGB: 11.7 g/dL (ref 11.6–15.9)
LYMPH%: 25.8 % (ref 14.0–49.7)
MCHC: 32.9 g/dL (ref 31.5–36.0)
MONO#: 0.6 10*3/uL (ref 0.1–0.9)
MONO%: 9.3 % (ref 0.0–14.0)
NEUT%: 62 % (ref 38.4–76.8)
Platelets: 227 10*3/uL (ref 145–400)
WBC: 6.8 10*3/uL (ref 3.9–10.3)
lymph#: 1.8 10*3/uL (ref 0.9–3.3)

## 2013-08-21 LAB — COMPREHENSIVE METABOLIC PANEL (CC13)
ALT: 15 U/L (ref 0–55)
Albumin: 3.9 g/dL (ref 3.5–5.0)
Anion Gap: 9 mEq/L (ref 3–11)
CO2: 28 mEq/L (ref 22–29)
Calcium: 9.5 mg/dL (ref 8.4–10.4)
Chloride: 103 mEq/L (ref 98–109)
Creatinine: 0.9 mg/dL (ref 0.6–1.1)
Potassium: 4.3 mEq/L (ref 3.5–5.1)

## 2013-08-21 MED ORDER — IOHEXOL 300 MG/ML  SOLN
100.0000 mL | Freq: Once | INTRAMUSCULAR | Status: AC | PRN
  Administered 2013-08-21: 100 mL via INTRAVENOUS

## 2013-08-21 NOTE — Telephone Encounter (Signed)
Pt left phone # with the lab requesting someone to call.  Called number, no answer, left message on vm to call back.  SLJ

## 2013-08-23 ENCOUNTER — Telehealth: Payer: Self-pay | Admitting: Internal Medicine

## 2013-08-23 ENCOUNTER — Ambulatory Visit (HOSPITAL_BASED_OUTPATIENT_CLINIC_OR_DEPARTMENT_OTHER): Payer: BC Managed Care – PPO | Admitting: Internal Medicine

## 2013-08-23 ENCOUNTER — Telehealth: Payer: Self-pay | Admitting: *Deleted

## 2013-08-23 ENCOUNTER — Encounter: Payer: Self-pay | Admitting: Internal Medicine

## 2013-08-23 DIAGNOSIS — C770 Secondary and unspecified malignant neoplasm of lymph nodes of head, face and neck: Secondary | ICD-10-CM

## 2013-08-23 DIAGNOSIS — C3490 Malignant neoplasm of unspecified part of unspecified bronchus or lung: Secondary | ICD-10-CM

## 2013-08-23 DIAGNOSIS — C781 Secondary malignant neoplasm of mediastinum: Secondary | ICD-10-CM

## 2013-08-23 DIAGNOSIS — D509 Iron deficiency anemia, unspecified: Secondary | ICD-10-CM

## 2013-08-23 NOTE — Telephone Encounter (Signed)
appts made per 12/31 POF AVS and CAL given Adv pt MRI will call with appt time for [id number] shh

## 2013-08-23 NOTE — Telephone Encounter (Signed)
Per staff message and POF I have scheduled appts.  JMW  

## 2013-08-23 NOTE — Progress Notes (Signed)
[facility] [phone]   [phone]  OFFICE PROGRESS NOTE  WHITLOW, [name], MD [address] [place] [address]  DIAGNOSIS: Stage IV non-small cell lung cancer, squamous cell carcinoma diagnosed in June of 2014.   PRIOR THERAPY:  Systemic chemotherapy with carboplatin for AUC of 5 on day 1 and Abraxane 100 mg/M2 on days 1, 8 and 15 every 3 weeks. Status post 5 cycles as well day 1 of cycle 6 with partial response.  CURRENT THERAPY: Systemic chemotherapy again with single agent Abraxane 100 mg/M2 on days 1 and 8 every 3 weeks. First dose on 08/31/2013  CHEMOTHERAPY INTENT: Palliative  CURRENT # OF CHEMOTHERAPY CYCLES: 1 CURRENT ANTIEMETICS: Zofran, dexamethasone, Compazine  CURRENT SMOKING STATUS: Former smoker, quit 12/31/2012  ORAL CHEMOTHERAPY AND CONSENT: n/a  CURRENT BISPHOSPHONATES USE: none  PAIN MANAGEMENT: oxycodone  NARCOTICS INDUCED CONSTIPATION: none  LIVING WILL AND CODE STATUS: Full code.    INTERVAL HISTORY: Jodi Shepherd 52 y.o. female returns to the clinic today for follow up visit. The patient has been observation for the last 2 months. She is feeling fine with no specific complaints except for mild fatigue. She denied having any significant chest pain, shortness breath, cough or hemoptysis. The patient has no nausea or vomiting. She has no significant weight loss or night sweats. She had repeat CT scan of the chest, abdomen and pelvis performed recently and she is here for evaluation and discussion of her scan results.  MEDICAL HISTORY: Past Medical History  Diagnosis Date  . Hypertension   . Hypothyroidism   . Anxiety   . Depression   . GERD (gastroesophageal reflux disease)   . Cancer     non- small cell squamous  . Shortness of breath   . COPD (chronic obstructive pulmonary disease)   . Headache(784.0)   . Arthritis     Hx; of osteoarthritis  . Diverticulosis     Hx: of    ALLERGIES:  has No Known  Allergies.  MEDICATIONS:  Current Outpatient Prescriptions  Medication Sig Dispense Refill  . ALPRAZolam (XANAX) 0.25 MG tablet Take 1 tablet (0.25 mg total) by mouth 3 (three) times daily as needed for anxiety.  30 tablet  0  . atenolol (TENORMIN) 50 MG tablet One daily      . calcium-vitamin D (OSCAL WITH D) 500-200 MG-UNIT per tablet Take 1 tablet by mouth every morning.      . carisoprodol (SOMA) 350 MG tablet Take 350 mg by mouth 4 (four) times daily as needed for muscle spasms.      Marland Kitchen docusate sodium (COLACE) 100 MG capsule Take 100 mg by mouth as needed for constipation.      Marland Kitchen esomeprazole (NEXIUM) 40 MG capsule Take 30-60 min before first meal of the day      . estradiol (ESTRACE) 2 MG tablet Take 2 mg by mouth every morning.       . fexofenadine (ALLEGRA) 180 MG tablet Take 180 mg by mouth daily as needed (alleriges).       Marland Kitchen FLUoxetine (PROZAC) 20 MG capsule Take 20 mg by mouth every morning.       . Iron-FA-B Cmp-C-Biot-Probiotic (FUSION PLUS) CAPS Take 1 capsule by mouth daily.  30 capsule  3  . lamoTRIgine (LAMICTAL) 100 MG tablet Take 100 mg by mouth 2 (two) times daily.       Marland Kitchen levothyroxine (SYNTHROID, LEVOTHROID) 50 MCG tablet Take 50 mcg by mouth daily  before breakfast.      . lidocaine-prilocaine (EMLA) cream Apply 1 application topically as needed (to port site).  30 g  0  . Multiple Vitamin (MULTIVITAMIN) tablet Take 1 tablet by mouth daily.      . NONFORMULARY OR COMPOUNDED ITEM Apply 1 application topically 3 (three) times daily as needed (for pain). Special compounded cream from [facility] in [place], [place] ; contains ketamine, ketoprofen, amitriptyline.      . ondansetron (ZOFRAN) 8 MG tablet Take 8 mg by mouth every 8 (eight) hours as needed.      Marland Kitchen oxybutynin (DITROPAN-XL) 10 MG 24 hr tablet Take 10 mg by mouth at bedtime.      Marland Kitchen oxycodone (ROXICODONE) 30 MG immediate release tablet Take 30 mg by mouth every 4 (four) hours as needed for pain.      Marland Kitchen  prochlorperazine (COMPAZINE) 10 MG tablet Take 1 tablet (10 mg total) by mouth every 6 (six) hours as needed (nausea).  60 tablet  0  . prochlorperazine (COMPAZINE) 25 MG suppository Place 1 suppository (25 mg total) rectally every 12 (twelve) hours as needed for nausea.  12 suppository  0  . simvastatin (ZOCOR) 40 MG tablet Take 40 mg by mouth every evening.       No current facility-administered medications for this visit.    SURGICAL HISTORY:  Past Surgical History  Procedure Laterality Date  . Abdominal hysterectomy    . Inner ear surgery    . Right -sided thoracic outlet surgery      Hx: of  . Colonoscopy w/ biopsies and polypectomy      Hx: of  . Rectal fistula repair      Hx: of  . Hemorrhoid surgery      Hx: of  . Portacath placement Bilateral 02/16/2013    Procedure: INSERTION PORT-A-CATH;  Surgeon: Kerin Perna, MD;  Location: [facility] OR;  Service: Thoracic;  Laterality: Bilateral;    REVIEW OF SYSTEMS:  Constitutional: positive for fatigue Eyes: negative Ears, nose, mouth, throat, and face: negative Respiratory: positive for dyspnea on exertion and pleurisy/chest pain Cardiovascular: negative Gastrointestinal: negative Genitourinary:negative Integument/breast: negative Hematologic/lymphatic: negative Musculoskeletal:negative Neurological: negative Behavioral/Psych: negative Endocrine: negative Allergic/Immunologic: negative   PHYSICAL EXAMINATION: General appearance: alert, cooperative, fatigued and no distress Head: Normocephalic, without obvious abnormality, atraumatic Neck: no adenopathy, no JVD, supple, symmetrical, trachea midline and thyroid not enlarged, symmetric, no tenderness/mass/nodules Lymph nodes: Cervical, supraclavicular, and axillary nodes normal. Resp: clear to auscultation bilaterally Back: symmetric, no curvature. ROM normal. No CVA tenderness. Cardio: regular rate and rhythm, S1, S2 normal, no murmur, click, rub or gallop GI: soft,  non-tender; bowel sounds normal; no masses,  no organomegaly Extremities: extremities normal, atraumatic, no cyanosis or edema Neurologic: Alert and oriented X 3, normal strength and tone. Normal symmetric reflexes. Normal coordination and gait  ECOG PERFORMANCE STATUS: 1 - Symptomatic but completely ambulatory  Blood pressure 134/61, pulse 58, temperature 98.1 F (36.7 C), temperature source Oral, resp. rate 18, height 5\' 5"  (1.651 m), weight 166 lb 4.8 oz (75.433 kg), SpO2 100.00%.  LABORATORY DATA: Lab Results  Component Value Date   WBC 6.8 08/21/2013   HGB 11.7 08/21/2013   HCT 35.7 08/21/2013   MCV 98.5 08/21/2013   PLT 227 08/21/2013      Chemistry      Component Value Date/Time   NA 141 08/21/2013 1152   NA 136 04/29/2013 1737   K 4.3 08/21/2013 1152   K 3.8 04/29/2013 1737  CL 100 04/29/2013 1737   CL 96* 02/09/2013 1131   CO2 28 08/21/2013 1152   CO2 27 04/29/2013 1737   BUN 22.2 08/21/2013 1152   BUN 15 04/29/2013 1737   CREATININE 0.9 08/21/2013 1152   CREATININE 0.61 04/29/2013 1737      Component Value Date/Time   CALCIUM 9.5 08/21/2013 1152   CALCIUM 8.5 04/29/2013 1737   ALKPHOS 61 08/21/2013 1152   ALKPHOS 110 03/10/2013 1655   AST 14 08/21/2013 1152   AST 23 03/10/2013 1655   ALT 15 08/21/2013 1152   ALT 27 03/10/2013 1655   BILITOT 0.21 08/21/2013 1152   BILITOT 0.1* 03/10/2013 1655       RADIOGRAPHIC STUDIES: Ct Chest W Contrast  08/21/2013   CLINICAL DATA:  Non-small-cell lung cancer for restaging after completion of chemotherapy 2 months ago. Patient reports chest pain and shortness of breath.  EXAM: CT CHEST, ABDOMEN, AND PELVIS WITH CONTRAST  TECHNIQUE: Multidetector CT imaging of the chest, abdomen and pelvis was performed following the standard protocol during bolus administration of intravenous contrast.  CONTRAST:  OMNIPAQUE IOHEXOL 300 MG/ML  SOLN  COMPARISON:  Prior examinations 06/13/2013 and 04/10/2013.  FINDINGS: CT CHEST FINDINGS  Left  subclavian Port-A-Cath tip extends to the upper SVC and appears unchanged. There is progressive superior mediastinal lymphadenopathy. A superior right paratracheal node measuring 1.3 cm on image 16 previously measured 8 mm. There is a 1.4 cm node lateral to the ascending aorta on image 25 which previously measured 8 mm. A 1.4 cm precarinal node on image 24 and a 1.6 cm subcarinal node on image 29 are not significantly changed. There is no axillary or hilar adenopathy. However, a left supraclavicular node appears slightly larger, measuring 8 mm on image 6.  Previously demonstrated tiny pleural effusions have nearly resolved. A small subpleural nodular density in the superior segment of the right lower lobe on image 26 is stable. The lungs are otherwise clear. Biapical emphysematous changes are again noted.  CT ABDOMEN AND PELVIS FINDINGS  Low-density lesion inferiorly in the right hepatic lobe is stable, measuring 2.0 cm on image 84 and likely representing a hemangioma. No new or enlarging liver lesions are identified. The gallbladder, spleen and pancreas appear normal.  Again demonstrated is a right adrenal nodule. This currently measures 2.3 x 1.6 cm on image 53, similar to the original study of 01/14/2013. A smaller left adrenal nodule is also unchanged. Both kidneys appear normal. There is no hydronephrosis.  Aortoiliac atherosclerosis is again noted. There are no enlarged abdominal pelvic lymph nodes. There is no ascites or peritoneal nodularity. The stomach, small bowel and colon demonstrate no significant findings. There is no adnexal mass following partial hysterectomy. Both ovaries appear unchanged.  There are no worrisome osseous findings.  IMPRESSION: 1. Recurrent/progressive superior mediastinal and left supraclavicular lymphadenopathy worrisome for progressive metastatic lung cancer. Precarinal and subcarinal nodes have not significantly changed. 2. No abdominal pelvic metastatic disease identified. No  residual abdominal adenopathy demonstrated. The bilateral adrenal nodules are unchanged and likely represent adenomas. 3. Stable liver lesion, most consistent with a hemangioma.   Electronically Signed   By: Roxy Horseman M.D.   On: 08/21/2013 15:11   ASSESSMENT AND PLAN: The patient is a pleasant 52 years old white female who was diagnosed with metastatic non-small cell lung cancer, squamous cell carcinoma completed 6 cycles of systemic chemotherapy with carboplatin and Abraxane with partial response. The patient has been observation for the last 2 months. She  had repeat CT scan of the chest, abdomen and pelvis which showed evidence for disease recurrence in the superior mediastinum and left supraclavicular lymphadenopathy. I discussed the scan results with the patient today. I recommended for her to resume systemic chemotherapy but she would be treated with single agent Abraxane 100 mg/M2 on days 1 and 8 every 3 weeks. I also gave the patient the option of palliative care but she would like to proceed with treatment. She is expected to start the first cycle of this chemotherapy on 08/31/2013. She would come back for follow up visit in 4 weeks with the start of cycle #2. For iron deficiency anemia, the patient will continue on Fusion plus.  The patient will continue her pain medication as prescribed by her pain clinic.  She was advised to call immediately if she has any concerning symptoms in the interval. The patient voices understanding of current disease status and treatment options and is in agreement with the current care plan.  All questions were answered. The patient knows to call the clinic with any problems, questions or concerns. We can certainly see the patient much sooner if necessary.  I spent 15 minutes counseling the patient face to face. The total time spent in the appointment was 25 minutes.

## 2013-08-24 NOTE — Patient Instructions (Signed)
CURRENT THERAPY: Systemic chemotherapy again with single agent Abraxane 100 mg/M2 on days 1 and 8 every 3 weeks. First dose on 08/31/2013  CHEMOTHERAPY INTENT: Palliative  CURRENT # OF CHEMOTHERAPY CYCLES: 1  CURRENT ANTIEMETICS: Zofran, dexamethasone, Compazine  CURRENT SMOKING STATUS: Former smoker, quit 12/31/2012  ORAL CHEMOTHERAPY AND CONSENT: n/a  CURRENT BISPHOSPHONATES USE: none  PAIN MANAGEMENT: oxycodone  NARCOTICS INDUCED CONSTIPATION: none  LIVING WILL AND CODE STATUS: Full code.

## 2013-08-28 ENCOUNTER — Other Ambulatory Visit: Payer: Self-pay | Admitting: *Deleted

## 2013-08-28 DIAGNOSIS — C3490 Malignant neoplasm of unspecified part of unspecified bronchus or lung: Secondary | ICD-10-CM

## 2013-08-28 MED ORDER — LIDOCAINE-PRILOCAINE 2.5-2.5 % EX CREA: 1.0000 "application " | TOPICAL_CREAM | CUTANEOUS | Status: AC | PRN

## 2013-08-30 ENCOUNTER — Encounter: Payer: Self-pay | Admitting: Internal Medicine

## 2013-08-30 NOTE — Progress Notes (Signed)
Patient left message and I called her back. She says she has balance over 5000.00 and inquired about financial asst. I verified her address and sent her an application,.

## 2013-08-31 ENCOUNTER — Ambulatory Visit (HOSPITAL_BASED_OUTPATIENT_CLINIC_OR_DEPARTMENT_OTHER): Payer: BC Managed Care – PPO

## 2013-08-31 VITALS — BP 172/83 | HR 69 | Temp 98.8°F | Resp 18

## 2013-08-31 DIAGNOSIS — C770 Secondary and unspecified malignant neoplasm of lymph nodes of head, face and neck: Secondary | ICD-10-CM

## 2013-08-31 DIAGNOSIS — C3490 Malignant neoplasm of unspecified part of unspecified bronchus or lung: Secondary | ICD-10-CM

## 2013-08-31 DIAGNOSIS — Z5111 Encounter for antineoplastic chemotherapy: Secondary | ICD-10-CM

## 2013-08-31 LAB — COMPREHENSIVE METABOLIC PANEL (CC13)
ALT: 10 U/L (ref 0–55)
ANION GAP: 11 meq/L (ref 3–11)
AST: 11 U/L (ref 5–34)
Albumin: 3.7 g/dL (ref 3.5–5.0)
Alkaline Phosphatase: 71 U/L (ref 40–150)
BUN: 16.1 mg/dL (ref 7.0–26.0)
CALCIUM: 9.3 mg/dL (ref 8.4–10.4)
CHLORIDE: 103 meq/L (ref 98–109)
CO2: 24 meq/L (ref 22–29)
Creatinine: 0.8 mg/dL (ref 0.6–1.1)
Glucose: 126 mg/dl (ref 70–140)
POTASSIUM: 3.8 meq/L (ref 3.5–5.1)
SODIUM: 138 meq/L (ref 136–145)
TOTAL PROTEIN: 6.9 g/dL (ref 6.4–8.3)
Total Bilirubin: 0.2 mg/dL (ref 0.20–1.20)

## 2013-08-31 LAB — CBC WITH DIFFERENTIAL/PLATELET
BASO%: 0.5 % (ref 0.0–2.0)
Basophils Absolute: 0 10*3/uL (ref 0.0–0.1)
EOS%: 1.6 % (ref 0.0–7.0)
Eosinophils Absolute: 0.1 10*3/uL (ref 0.0–0.5)
HCT: 35.1 % (ref 34.8–46.6)
HGB: 11.9 g/dL (ref 11.6–15.9)
LYMPH#: 1.5 10*3/uL (ref 0.9–3.3)
LYMPH%: 26 % (ref 14.0–49.7)
MCH: 32.1 pg (ref 25.1–34.0)
MCHC: 33.9 g/dL (ref 31.5–36.0)
MCV: 94.6 fL (ref 79.5–101.0)
MONO#: 0.4 10*3/uL (ref 0.1–0.9)
MONO%: 7.3 % (ref 0.0–14.0)
NEUT#: 3.7 10*3/uL (ref 1.5–6.5)
NEUT%: 64.6 % (ref 38.4–76.8)
Platelets: 182 10*3/uL (ref 145–400)
RBC: 3.71 10*6/uL (ref 3.70–5.45)
RDW: 12.1 % (ref 11.2–14.5)
WBC: 5.8 10*3/uL (ref 3.9–10.3)

## 2013-08-31 MED ORDER — DEXAMETHASONE SODIUM PHOSPHATE 10 MG/ML IJ SOLN
10.0000 mg | Freq: Once | INTRAMUSCULAR | Status: AC
  Administered 2013-08-31: 10 mg via INTRAVENOUS

## 2013-08-31 MED ORDER — ONDANSETRON 8 MG/50ML IVPB (CHCC)
8.0000 mg | Freq: Once | INTRAVENOUS | Status: AC
  Administered 2013-08-31: 8 mg via INTRAVENOUS

## 2013-08-31 MED ORDER — ONDANSETRON 8 MG/NS 50 ML IVPB
INTRAVENOUS | Status: AC
  Filled 2013-08-31: qty 8

## 2013-08-31 MED ORDER — DEXAMETHASONE SODIUM PHOSPHATE 10 MG/ML IJ SOLN
INTRAMUSCULAR | Status: AC
  Filled 2013-08-31: qty 1

## 2013-08-31 MED ORDER — SODIUM CHLORIDE 0.9 % IV SOLN
Freq: Once | INTRAVENOUS | Status: AC
  Administered 2013-08-31: 14:00:00 via INTRAVENOUS

## 2013-08-31 MED ORDER — SODIUM CHLORIDE 0.9 % IJ SOLN
10.0000 mL | INTRAMUSCULAR | Status: DC | PRN
  Administered 2013-08-31: 10 mL
  Filled 2013-08-31: qty 10

## 2013-08-31 MED ORDER — HEPARIN SOD (PORK) LOCK FLUSH 100 UNIT/ML IV SOLN
500.0000 [IU] | Freq: Once | INTRAVENOUS | Status: AC | PRN
  Administered 2013-08-31: 500 [IU]
  Filled 2013-08-31: qty 5

## 2013-08-31 MED ORDER — PACLITAXEL PROTEIN-BOUND CHEMO INJECTION 100 MG
100.0000 mg/m2 | Freq: Once | INTRAVENOUS | Status: AC
  Administered 2013-08-31: 175 mg via INTRAVENOUS
  Filled 2013-08-31: qty 35

## 2013-08-31 NOTE — Patient Instructions (Addendum)
[facility] Discharge Instructions for Patients Receiving Chemotherapy  Today you received the following chemotherapy agents: abraxane  To help prevent nausea and vomiting after your treatment, we encourage you to take your nausea medication.  Take it as often as prescribed.     If you develop nausea and vomiting that is not controlled by your nausea medication, call the clinic. If it is after clinic hours your family physician or the after hours number for the clinic or go to the Emergency Department.   BELOW ARE SYMPTOMS THAT SHOULD BE REPORTED IMMEDIATELY:  *FEVER GREATER THAN 100.5 F  *CHILLS WITH OR WITHOUT FEVER  NAUSEA AND VOMITING THAT IS NOT CONTROLLED WITH YOUR NAUSEA MEDICATION  *UNUSUAL SHORTNESS OF BREATH  *UNUSUAL BRUISING OR BLEEDING  TENDERNESS IN MOUTH AND THROAT WITH OR WITHOUT PRESENCE OF ULCERS  *URINARY PROBLEMS  *BOWEL PROBLEMS  UNUSUAL RASH Items with * indicate a potential emergency and should be followed up as soon as possible.  Feel free to call the clinic you have any questions or concerns. The clinic phone number is [phone].   I have been informed and understand all the instructions given to me. I know to contact the clinic, my physician, or go to the Emergency Department if any problems should occur. I do not have any questions at this time, but understand that I may call the clinic during office hours   should I have any questions or need assistance in obtaining follow up care.    __________________________________________  _____________  __________ Signature of Patient or Authorized Representative            Date                   Time    __________________________________________ Nurse's Signature    Nanoparticle Albumin-Bound Paclitaxel injection (abraxane) What is this medicine? NANOPARTICLE ALBUMIN-BOUND PACLITAXEL (Na no PAHR ti kuhl al BYOO muhn-bound PAK li TAX el) is a chemotherapy drug. It targets fast  dividing cells, like cancer cells, and causes these cells to die. This medicine is used to treat advanced breast cancer and advanced lung cancer. This medicine may be used for other purposes; ask your health care provider or pharmacist if you have questions. COMMON BRAND NAME(S): Abraxane What should I tell my health care provider before I take this medicine? They need to know if you have any of these conditions: -kidney disease -liver disease -low blood counts, like low platelets, red blood cells, or white blood cells -recent or ongoing radiation therapy -an unusual or allergic reaction to paclitaxel, albumin, other chemotherapy, other medicines, foods, dyes, or preservatives -pregnant or trying to get pregnant -breast-feeding How should I use this medicine? This drug is given as an infusion into a vein. It is administered in a hospital or clinic by a specially trained health care professional. Talk to your pediatrician regarding the use of this medicine in children. Special care may be needed. Overdosage: If you think you have taken too much of this medicine contact a poison control center or emergency room at once. NOTE: This medicine is only for you. Do not share this medicine with others. What if I miss a dose? It is important not to miss your dose. Call your doctor or health care professional if you are unable to keep an appointment. What may interact with this medicine? -cyclosporine -diazepam -ketoconazole -medicines to increase blood counts like filgrastim, pegfilgrastim, sargramostim -other chemotherapy drugs like cisplatin, doxorubicin, epirubicin, etoposide, teniposide, vincristine -  quinidine -testosterone -vaccines -verapamil Talk to your doctor or health care professional before taking any of these medicines: -acetaminophen -aspirin -ibuprofen -ketoprofen -naproxen This list may not describe all possible interactions. Give your health care provider a list of all the  medicines, herbs, non-prescription drugs, or dietary supplements you use. Also tell them if you smoke, drink alcohol, or use illegal drugs. Some items may interact with your medicine. What should I watch for while using this medicine? Your condition will be monitored carefully while you are receiving this medicine. You will need important blood work done while you are taking this medicine. This drug may make you feel generally unwell. This is not uncommon, as chemotherapy can affect healthy cells as well as cancer cells. Report any side effects. Continue your course of treatment even though you feel ill unless your doctor tells you to stop. In some cases, you may be given additional medicines to help with side effects. Follow all directions for their use. Call your doctor or health care professional for advice if you get a fever, chills or sore throat, or other symptoms of a cold or flu. Do not treat yourself. This drug decreases your body's ability to fight infections. Try to avoid being around people who are sick. This medicine may increase your risk to bruise or bleed. Call your doctor or health care professional if you notice any unusual bleeding. Be careful brushing and flossing your teeth or using a toothpick because you may get an infection or bleed more easily. If you have any dental work done, tell your dentist you are receiving this medicine. Avoid taking products that contain aspirin, acetaminophen, ibuprofen, naproxen, or ketoprofen unless instructed by your doctor. These medicines may hide a fever. Do not become pregnant while taking this medicine. Women should inform their doctor if they wish to become pregnant or think they might be pregnant. There is a potential for serious side effects to an unborn child. Talk to your health care professional or pharmacist for more information. Do not breast-feed an infant while taking this medicine. Men are advised not to father a child while receiving  this medicine. What side effects may I notice from receiving this medicine? Side effects that you should report to your doctor or health care professional as soon as possible: -allergic reactions like skin rash, itching or hives, swelling of the face, lips, or tongue -low blood counts - This drug may decrease the number of white blood cells, red blood cells and platelets. You may be at increased risk for infections and bleeding. -signs of infection - fever or chills, cough, sore throat, pain or difficulty passing urine -signs of decreased platelets or bleeding - bruising, pinpoint red spots on the skin, black, tarry stools, nosebleeds -signs of decreased red blood cells - unusually weak or tired, fainting spells, lightheadedness -breathing problems -changes in vision -chest pain -high or low blood pressure -mouth sores -nausea and vomiting -pain, swelling, redness or irritation at the injection site -pain, tingling, numbness in the hands or feet -slow or irregular heartbeat -swelling of the ankle, feet, hands Side effects that usually do not require medical attention (report to your doctor or health care professional if they continue or are bothersome): -aches, pains -changes in the color of fingernails -diarrhea -hair loss -loss of appetite This list may not describe all possible side effects. Call your doctor for medical advice about side effects. You may report side effects to FDA at 1-800-FDA-1088. Where should I keep my medicine? This  drug is given in a hospital or clinic and will not be stored at home. NOTE: This sheet is a summary. It may not cover all possible information. If you have questions about this medicine, talk to your doctor, pharmacist, or health care provider.  2014, [employer]vier/Gold Standard. (2012-10-03 16:48:50)

## 2013-09-01 ENCOUNTER — Other Ambulatory Visit: Payer: Self-pay | Admitting: *Deleted

## 2013-09-01 DIAGNOSIS — C3490 Malignant neoplasm of unspecified part of unspecified bronchus or lung: Secondary | ICD-10-CM

## 2013-09-01 NOTE — Progress Notes (Signed)
Late entry because of problems with EPIC. Patient requested chaplain visit while in infusion because she was "discouraged" with having to return to treatment. Normalized her experience; narrative work and facilitated patient's identifying, evaluating, and gaining insight from her emotions. Helped patient explore sources of hope and relational needs and resources. Patient said that she "felt better" after chaplain's visit.  Epifania Gore, [facility] [place], [place], MDiv, PhD, [phone].

## 2013-09-04 ENCOUNTER — Telehealth: Payer: Self-pay | Admitting: Internal Medicine

## 2013-09-04 NOTE — Telephone Encounter (Signed)
lvm for pt regarding to 1.15 MRI d.t

## 2013-09-07 ENCOUNTER — Other Ambulatory Visit (HOSPITAL_BASED_OUTPATIENT_CLINIC_OR_DEPARTMENT_OTHER): Payer: BC Managed Care – PPO

## 2013-09-07 ENCOUNTER — Other Ambulatory Visit: Payer: Self-pay | Admitting: Internal Medicine

## 2013-09-07 ENCOUNTER — Ambulatory Visit (HOSPITAL_BASED_OUTPATIENT_CLINIC_OR_DEPARTMENT_OTHER): Payer: BC Managed Care – PPO

## 2013-09-07 ENCOUNTER — Encounter: Payer: Self-pay | Admitting: *Deleted

## 2013-09-07 ENCOUNTER — Ambulatory Visit
Admission: RE | Admit: 2013-09-07 | Discharge: 2013-09-07 | Disposition: A | Discharge status: Home / Self Care | Payer: BC Managed Care – PPO | Source: Ambulatory Visit | Attending: Internal Medicine | Admitting: Internal Medicine

## 2013-09-07 ENCOUNTER — Other Ambulatory Visit: Payer: Self-pay | Admitting: *Deleted

## 2013-09-07 VITALS — BP 125/63 | HR 58 | Temp 98.1°F | Resp 18

## 2013-09-07 DIAGNOSIS — C3490 Malignant neoplasm of unspecified part of unspecified bronchus or lung: Secondary | ICD-10-CM

## 2013-09-07 DIAGNOSIS — C770 Secondary and unspecified malignant neoplasm of lymph nodes of head, face and neck: Secondary | ICD-10-CM

## 2013-09-07 DIAGNOSIS — Z5111 Encounter for antineoplastic chemotherapy: Secondary | ICD-10-CM

## 2013-09-07 LAB — CBC WITH DIFFERENTIAL/PLATELET
BASO%: 0.4 % (ref 0.0–2.0)
BASOS ABS: 0 10*3/uL (ref 0.0–0.1)
EOS ABS: 0.1 10*3/uL (ref 0.0–0.5)
EOS%: 2.6 % (ref 0.0–7.0)
HCT: 33.4 % — ABNORMAL LOW (ref 34.8–46.6)
HEMOGLOBIN: 11.1 g/dL — AB (ref 11.6–15.9)
LYMPH%: 27.5 % (ref 14.0–49.7)
MCH: 31.4 pg (ref 25.1–34.0)
MCHC: 33.2 g/dL (ref 31.5–36.0)
MCV: 94.6 fL (ref 79.5–101.0)
MONO#: 0.3 10*3/uL (ref 0.1–0.9)
MONO%: 6.5 % (ref 0.0–14.0)
NEUT#: 2.9 10*3/uL (ref 1.5–6.5)
NEUT%: 63 % (ref 38.4–76.8)
Platelets: 203 10*3/uL (ref 145–400)
RBC: 3.53 10*6/uL — ABNORMAL LOW (ref 3.70–5.45)
RDW: 12 % (ref 11.2–14.5)
WBC: 4.6 10*3/uL (ref 3.9–10.3)
lymph#: 1.3 10*3/uL (ref 0.9–3.3)
nRBC: 0 % (ref 0–0)

## 2013-09-07 LAB — COMPREHENSIVE METABOLIC PANEL (CC13)
ALBUMIN: 3.6 g/dL (ref 3.5–5.0)
ALT: 13 U/L (ref 0–55)
AST: 12 U/L (ref 5–34)
Alkaline Phosphatase: 79 U/L (ref 40–150)
Anion Gap: 10 mEq/L (ref 3–11)
BUN: 15.6 mg/dL (ref 7.0–26.0)
CO2: 26 meq/L (ref 22–29)
Calcium: 9.5 mg/dL (ref 8.4–10.4)
Chloride: 104 mEq/L (ref 98–109)
Creatinine: 0.8 mg/dL (ref 0.6–1.1)
GLUCOSE: 90 mg/dL (ref 70–140)
Potassium: 4.2 mEq/L (ref 3.5–5.1)
Sodium: 140 mEq/L (ref 136–145)
Total Protein: 6.9 g/dL (ref 6.4–8.3)

## 2013-09-07 MED ORDER — HEPARIN SOD (PORK) LOCK FLUSH 100 UNIT/ML IV SOLN
500.0000 [IU] | Freq: Once | INTRAVENOUS | Status: AC | PRN
  Administered 2013-09-07: 500 [IU]
  Filled 2013-09-07: qty 5

## 2013-09-07 MED ORDER — SODIUM CHLORIDE 0.9 % IV SOLN
Freq: Once | INTRAVENOUS | Status: AC
  Administered 2013-09-07: 13:00:00 via INTRAVENOUS

## 2013-09-07 MED ORDER — SODIUM CHLORIDE 0.9 % IJ SOLN
10.0000 mL | INTRAMUSCULAR | Status: DC | PRN
  Administered 2013-09-07: 10 mL
  Filled 2013-09-07: qty 10

## 2013-09-07 MED ORDER — DEXAMETHASONE 4 MG PO TABS: 4.0000 mg | ORAL_TABLET | Freq: Two times a day (BID) | ORAL | Status: AC

## 2013-09-07 MED ORDER — DEXAMETHASONE SODIUM PHOSPHATE 10 MG/ML IJ SOLN
10.0000 mg | Freq: Once | INTRAMUSCULAR | Status: AC
  Administered 2013-09-07: 10 mg via INTRAVENOUS

## 2013-09-07 MED ORDER — ONDANSETRON 8 MG/NS 50 ML IVPB
INTRAVENOUS | Status: AC
  Filled 2013-09-07: qty 8

## 2013-09-07 MED ORDER — ONDANSETRON 8 MG/50ML IVPB (CHCC)
8.0000 mg | Freq: Once | INTRAVENOUS | Status: AC
  Administered 2013-09-07: 8 mg via INTRAVENOUS

## 2013-09-07 MED ORDER — DEXAMETHASONE SODIUM PHOSPHATE 10 MG/ML IJ SOLN
INTRAMUSCULAR | Status: AC
  Filled 2013-09-07: qty 1

## 2013-09-07 MED ORDER — GADOBENATE DIMEGLUMINE 529 MG/ML IV SOLN
15.0000 mL | Freq: Once | INTRAVENOUS | Status: AC | PRN
  Administered 2013-09-07: 15 mL via INTRAVENOUS

## 2013-09-07 MED ORDER — PACLITAXEL PROTEIN-BOUND CHEMO INJECTION 100 MG
100.0000 mg/m2 | Freq: Once | INTRAVENOUS | Status: AC
  Administered 2013-09-07: 175 mg via INTRAVENOUS
  Filled 2013-09-07: qty 35

## 2013-09-07 NOTE — Patient Instructions (Signed)
[facility] Discharge Instructions for Patients Receiving Chemotherapy  Today you received the following chemotherapy agents: Abraxane  To help prevent nausea and vomiting after your treatment, we encourage you to take your nausea medication as prescribed.    If you develop nausea and vomiting that is not controlled by your nausea medication, call the clinic.   BELOW ARE SYMPTOMS THAT SHOULD BE REPORTED IMMEDIATELY:  *FEVER GREATER THAN 100.5 F  *CHILLS WITH OR WITHOUT FEVER  NAUSEA AND VOMITING THAT IS NOT CONTROLLED WITH YOUR NAUSEA MEDICATION  *UNUSUAL SHORTNESS OF BREATH  *UNUSUAL BRUISING OR BLEEDING  TENDERNESS IN MOUTH AND THROAT WITH OR WITHOUT PRESENCE OF ULCERS  *URINARY PROBLEMS  *BOWEL PROBLEMS  UNUSUAL RASH Items with * indicate a potential emergency and should be followed up as soon as possible.  Feel free to call the clinic you have any questions or concerns. The clinic phone number is [phone].

## 2013-09-07 NOTE — Progress Notes (Signed)
Chaplain made initial visit. Pt had spoken with chaplain Ubaldo Glassing last week. She said she was doing okay but was discouraged about having to come back for more treatment. MD came over to visit her. Chaplain will follow up as necessary.

## 2013-09-11 ENCOUNTER — Telehealth: Payer: Self-pay | Admitting: Internal Medicine

## 2013-09-11 NOTE — Telephone Encounter (Signed)
lvm for pt regarding to 1.22.15 appt with Dr. Lisbeth Renshaw @ 1:30pm

## 2013-09-13 NOTE — Progress Notes (Signed)
Thoracic Location of Tumor / Histology:non small cell lung cancer  Stage IV squamous cell dx 01/2013 left supraclavicular node   Now Brain mets=MRI 09/07/13:IMPRESSION: Findings consistent with metastatic disease. 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 There is considerable edema in the cerebellar hemispheres, right greater than left. There is mass effect on the fourth ventricle  without hydrocephalus.   Patient presented  months ago with symptoms of: fatigue,sob, dizzyness, unsteady  Biopsies of  (if applicable) revealed: Diagnosis 01/26/13 Lymph node, biopsy, Left supraclavicular node- METASTATIC CARCINOMA.   Tobacco/Marijuana/Snuff/ETOH use: quit cigarettes 5/10//2014, no alcohol or illicit drugs  Past/Anticipated interventions by cardiothoracic surgery, if any: right sided thoracic outlet surgery   Past/Anticipated interventions by medical oncology, if any: DIAGNOSIS: Stage IV non-small cell lung cancer, squamous cell carcinoma diagnosed in June of 2014.  PRIOR THERAPY: Systemic chemotherapy with carboplatin for AUC of 5 on day 1 and Abraxane 100 mg/M2 on days 1, 8 and 15 every 3 weeks. Status post 5 cycles as well day 1 of cycle 6 with partial response.  CURRENT THERAPY: Systemic chemotherapy again with single agent Abraxane 100 mg/M2 on days 1 and 8 every 3 weeks. First dose on 08/31/2013  CHEMOTHERAPY INTENT: Palliative  CURRENT # OF CHEMOTHERAPY CYCLES: 1,08/31/13   Signs/Symptoms  Weight changes, if any: slight weight gain 4 lbs  Respiratory complaints, if any: no  Hemoptysis, if any: no  Pain issues, if any: headache 5 on 1-10 pain scale, gets 30 minutes relief  , deep breathing chest hurts, generalized  achiness all over   SAFETY ISSUES:yes   Prior radiation? no  Pacemaker/ICD? no  Possible current pregnancy? no  Is the patient on methotrexate?  no  Current Complaints / other details:  Married,  3 grown children,   Depression,COPD,colonoscopy w/polypectomy,hysterectomy,Anxiety

## 2013-09-14 ENCOUNTER — Ambulatory Visit
Admission: RE | Admit: 2013-09-14 | Discharge: 2013-09-14 | Disposition: A | Discharge status: Home / Self Care | Payer: BC Managed Care – PPO | Source: Ambulatory Visit | Attending: Radiation Oncology | Admitting: Radiation Oncology

## 2013-09-14 VITALS — BP 134/81 | HR 60 | Temp 97.9°F | Resp 20 | Ht 65.0 in | Wt 171.2 lb

## 2013-09-14 DIAGNOSIS — I10 Essential (primary) hypertension: Secondary | ICD-10-CM | POA: Insufficient documentation

## 2013-09-14 DIAGNOSIS — Z9221 Personal history of antineoplastic chemotherapy: Secondary | ICD-10-CM | POA: Insufficient documentation

## 2013-09-14 DIAGNOSIS — I708 Atherosclerosis of other arteries: Secondary | ICD-10-CM | POA: Insufficient documentation

## 2013-09-14 DIAGNOSIS — C3490 Malignant neoplasm of unspecified part of unspecified bronchus or lung: Secondary | ICD-10-CM

## 2013-09-14 DIAGNOSIS — C7931 Secondary malignant neoplasm of brain: Secondary | ICD-10-CM | POA: Insufficient documentation

## 2013-09-14 DIAGNOSIS — Z833 Family history of diabetes mellitus: Secondary | ICD-10-CM | POA: Insufficient documentation

## 2013-09-14 DIAGNOSIS — Z87891 Personal history of nicotine dependence: Secondary | ICD-10-CM | POA: Insufficient documentation

## 2013-09-14 DIAGNOSIS — F329 Major depressive disorder, single episode, unspecified: Secondary | ICD-10-CM | POA: Insufficient documentation

## 2013-09-14 DIAGNOSIS — E279 Disorder of adrenal gland, unspecified: Secondary | ICD-10-CM | POA: Insufficient documentation

## 2013-09-14 DIAGNOSIS — J4489 Other specified chronic obstructive pulmonary disease: Secondary | ICD-10-CM | POA: Insufficient documentation

## 2013-09-14 DIAGNOSIS — Z79899 Other long term (current) drug therapy: Secondary | ICD-10-CM | POA: Insufficient documentation

## 2013-09-14 DIAGNOSIS — E039 Hypothyroidism, unspecified: Secondary | ICD-10-CM | POA: Insufficient documentation

## 2013-09-14 DIAGNOSIS — F3289 Other specified depressive episodes: Secondary | ICD-10-CM | POA: Insufficient documentation

## 2013-09-14 DIAGNOSIS — K219 Gastro-esophageal reflux disease without esophagitis: Secondary | ICD-10-CM | POA: Insufficient documentation

## 2013-09-14 DIAGNOSIS — R599 Enlarged lymph nodes, unspecified: Secondary | ICD-10-CM | POA: Insufficient documentation

## 2013-09-14 DIAGNOSIS — K7689 Other specified diseases of liver: Secondary | ICD-10-CM | POA: Insufficient documentation

## 2013-09-14 DIAGNOSIS — J449 Chronic obstructive pulmonary disease, unspecified: Secondary | ICD-10-CM | POA: Insufficient documentation

## 2013-09-14 DIAGNOSIS — C7949 Secondary malignant neoplasm of other parts of nervous system: Principal | ICD-10-CM

## 2013-09-14 DIAGNOSIS — Z808 Family history of malignant neoplasm of other organs or systems: Secondary | ICD-10-CM | POA: Insufficient documentation

## 2013-09-14 DIAGNOSIS — K5730 Diverticulosis of large intestine without perforation or abscess without bleeding: Secondary | ICD-10-CM | POA: Insufficient documentation

## 2013-09-14 DIAGNOSIS — G936 Cerebral edema: Secondary | ICD-10-CM | POA: Insufficient documentation

## 2013-09-14 NOTE — Progress Notes (Signed)
Please see the Nurse Progress Note in the MD Initial Consult Encounter for this patient. 

## 2013-09-15 DIAGNOSIS — C7931 Secondary malignant neoplasm of brain: Secondary | ICD-10-CM | POA: Insufficient documentation

## 2013-09-15 DIAGNOSIS — C7949 Secondary malignant neoplasm of other parts of nervous system: Principal | ICD-10-CM

## 2013-09-15 NOTE — Progress Notes (Signed)
Radiation Oncology         [phone] ________________________________  Name: Jodi Shepherd MRN: [id number]  Date: 09/14/2013  DOB: 1961-06-03  WN:[id number], Daiva Eves., MD  Curt Bears, MD     REFERRING PHYSICIAN: Curt Bears, MD   DIAGNOSIS: The primary encounter diagnosis was Brain metastases. A diagnosis of Secondary malignant neoplasm of brain and spinal cord was also pertinent to this visit.   HISTORY OF PRESENT ILLNESS::Jodi Shepherd is a 53 y.o. female who is seen for an initial consultation visit. The patient is a history of stage IV non-small cell lung cancer diagnosed in June of 2014. She has undergone systemic chemotherapy with Dr. Julien Nordmann. She has undergone treatment with carboplatin and abraxane and the patient more recently has proceeded with single agent abraxane.  Overall she feels that she has done relatively well with her chemotherapy. She describes having some difficulty with nausea and decreased blood counts.  The patient indicates that she began experiencing some dizziness possibly 6-8 weeks ago. She denies any falls but has been having some headaches more recently as well. The patient proceeded to undergo an MRI scan of the brain on 09/07/2013. Findings were consistent with metastatic disease. Abnormal edema was present throughout the cerebellar hemispheres bilaterally, greater on the right. Extensive enhancement was seen throughout the subarachnoid space of the cerebellar hemispheres consistent with metastatic disease. 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 Mass effect on the fourth ventricle was present without hydrocephalus.  The patient has begun steroids, Decadron 4 mg twice a day. I have been asked to see the patient for consideration of radiation treatment given this recent finding.   PREVIOUS RADIATION THERAPY: No   PAST MEDICAL HISTORY:  has a past medical history of  Hypertension; Hypothyroidism; Anxiety; Depression; GERD (gastroesophageal reflux disease); Cancer; Shortness of breath; COPD (chronic obstructive pulmonary disease); Headache(784.0); Arthritis; and Diverticulosis.     PAST SURGICAL HISTORY: Past Surgical History  Procedure Laterality Date  . Abdominal hysterectomy    . Inner ear surgery    . Right -sided thoracic outlet surgery      Hx: of  . Colonoscopy w/ biopsies and polypectomy      Hx: of  . Rectal fistula repair      Hx: of  . Hemorrhoid surgery      Hx: of  . Portacath placement Bilateral 02/16/2013    Procedure: INSERTION PORT-A-CATH;  Surgeon: Ivin Poot, MD;  Location: [facility];  Service: Thoracic;  Laterality: Bilateral;     FAMILY HISTORY: family history includes Brain cancer in her maternal aunt; Breast cancer in her maternal aunt; Diabetes Mellitus II in her father; Emphysema in her father; Heart failure in her sister; Lung cancer in her maternal aunt; Rheum arthritis in her maternal grandmother; Skin cancer in her mother.   SOCIAL HISTORY:  reports that she quit smoking about 8 months ago. Her smoking use included Cigarettes. She has a 20 pack-year smoking history. She has never used smokeless tobacco. She reports that she does not drink alcohol or use illicit drugs.   ALLERGIES: Review of patient's allergies indicates no known allergies.   MEDICATIONS:  Current Outpatient Prescriptions  Medication Sig Dispense Refill  . atenolol (TENORMIN) 50 MG tablet 50 mg 2 (two) times daily. One daily      . calcium-vitamin D (OSCAL WITH D) 500-200 MG-UNIT per tablet Take 1 tablet by mouth every morning.      . carisoprodol (SOMA) 350  MG tablet Take 350 mg by mouth 4 (four) times daily as needed for muscle spasms.      Marland Kitchen desonide (DESOWEN) 0.05 % ointment       . dexamethasone (DECADRON) 4 MG tablet Take 1 tablet (4 mg total) by mouth 2 (two) times daily.  40 tablet  1  . docusate sodium (COLACE) 100 MG capsule Take 100 mg  by mouth as needed for constipation.      Marland Kitchen esomeprazole (NEXIUM) 40 MG capsule Take 30-60 min before first meal of the day      . estradiol (ESTRACE) 2 MG tablet Take 2 mg by mouth every morning.       . fexofenadine (ALLEGRA) 180 MG tablet Take 180 mg by mouth daily as needed (alleriges).       Marland Kitchen FLUoxetine (PROZAC) 20 MG capsule Take 60 mg by mouth daily.       . Iron-FA-B Cmp-C-Biot-Probiotic (FUSION PLUS) CAPS Take 1 capsule by mouth daily.  30 capsule  3  . lamoTRIgine (LAMICTAL) 100 MG tablet Take 100 mg by mouth 2 (two) times daily.       Marland Kitchen levothyroxine (SYNTHROID, LEVOTHROID) 50 MCG tablet Take 50 mcg by mouth daily before breakfast.      . lisinopril (PRINIVIL,ZESTRIL) 30 MG tablet Take 30 mg by mouth daily.      . meclizine (ANTIVERT) 25 MG tablet Take 25 mg by mouth as needed.      . Multiple Vitamin (MULTIVITAMIN) tablet Take 1 tablet by mouth daily.      . NONFORMULARY OR COMPOUNDED ITEM Apply 1 application topically 3 (three) times daily as needed (for pain). Special compounded cream from [facility] in [place], [place] ; contains ketamine, ketoprofen, amitriptyline.      . ondansetron (ZOFRAN) 8 MG tablet Take 8 mg by mouth every 8 (eight) hours as needed.      Marland Kitchen oxybutynin (DITROPAN-XL) 10 MG 24 hr tablet Take 10 mg by mouth at bedtime.      Marland Kitchen oxycodone (ROXICODONE) 30 MG immediate release tablet Take 30 mg by mouth every 4 (four) hours as needed for pain.      Marland Kitchen prochlorperazine (COMPAZINE) 10 MG tablet Take 1 tablet (10 mg total) by mouth every 6 (six) hours as needed (nausea).  60 tablet  0  . simvastatin (ZOCOR) 40 MG tablet Take 40 mg by mouth every evening.      Marland Kitchen ALPRAZolam (XANAX) 0.25 MG tablet Take 1 tablet (0.25 mg total) by mouth 3 (three) times daily as needed for anxiety.  30 tablet  0  . lidocaine-prilocaine (EMLA) cream Apply 1 application topically as needed (to port site).  30 g  0  . prochlorperazine (COMPAZINE) 25 MG suppository Place 1 suppository (25 mg total)  rectally every 12 (twelve) hours as needed for nausea.  12 suppository  0   No current facility-administered medications for this encounter.     REVIEW OF SYSTEMS:  A 15 point review of systems is documented in the electronic medical record. This was obtained by the nursing staff. However, I reviewed this with the patient to discuss relevant findings and make appropriate changes.  Pertinent items are noted in HPI.    PHYSICAL EXAM:  height is 5\' 5"  (1.651 m) and weight is 171 lb 3.2 oz (77.656 kg). Her oral temperature is 97.9 F (36.6 C). Her blood pressure is 134/81 and her pulse is 60. Her respiration is 20 and oxygen saturation is 97%.   ECOG =  1  0 - Asymptomatic (Fully active, able to carry on all predisease activities without restriction)  1 - Symptomatic but completely ambulatory (Restricted in physically strenuous activity but ambulatory and able to carry out work of a light or sedentary nature. For example, light housework, office work)  2 - Symptomatic, <50% in bed during the day (Ambulatory and capable of all self care but unable to carry out any work activities. Up and about more than 50% of waking hours)  3 - Symptomatic, >50% in bed, but not bedbound (Capable of only limited self-care, confined to bed or chair 50% or more of waking hours)  4 - Bedbound (Completely disabled. Cannot carry on any self-care. Totally confined to bed or chair)  5 - Death   Eustace Pen MM, Creech RH, Tormey DC, et al. [phone]). "Toxicity and response criteria of the [employer]". [place] Oncol. 5 (6): 649-55  General: Well-developed, in no acute distress HEENT: Normocephalic, atraumatic; oral cavity clear Neck: Supple without any lymphadenopathy Cardiovascular: Regular rate and rhythm Respiratory: Clear to auscultation bilaterally GI: Soft, nontender, normal bowel sounds Extremities: No edema present Neuro: No focal deficits     LABORATORY DATA:  Lab Results    Component Value Date   WBC 4.6 09/07/2013   HGB 11.1* 09/07/2013   HCT 33.4* 09/07/2013   MCV 94.6 09/07/2013   PLT 203 09/07/2013   Lab Results  Component Value Date   NA 140 09/07/2013   K 4.2 09/07/2013   CL 100 04/29/2013   CO2 26 09/07/2013   Lab Results  Component Value Date   ALT 13 09/07/2013   AST 12 09/07/2013   ALKPHOS 79 09/07/2013   BILITOT <0.20 09/07/2013      RADIOGRAPHY: Ct Chest W Contrast  08/21/2013   CLINICAL DATA:  Non-small-cell lung cancer for restaging after completion of chemotherapy 2 months ago. Patient reports chest pain and shortness of breath.  EXAM: CT CHEST, ABDOMEN, AND PELVIS WITH CONTRAST  TECHNIQUE: Multidetector CT imaging of the chest, abdomen and pelvis was performed following the standard protocol during bolus administration of intravenous contrast.  CONTRAST:  159mL OMNIPAQUE IOHEXOL 300 MG/ML  SOLN  COMPARISON:  Prior examinations 06/13/2013 and 04/10/2013.  FINDINGS: CT CHEST FINDINGS  Left subclavian Port-A-Cath tip extends to the upper SVC and appears unchanged. There is progressive superior mediastinal lymphadenopathy. A superior right paratracheal node measuring 1.3 cm on image 16 previously measured 8 mm. There is a 1.4 cm node lateral to the ascending aorta on image 25 which previously measured 8 mm. A 1.4 cm precarinal node on image 24 and a 1.6 cm subcarinal node on image 29 are not significantly changed. There is no axillary or hilar adenopathy. However, a left supraclavicular node appears slightly larger, measuring 8 mm on image 6.  Previously demonstrated tiny pleural effusions have nearly resolved. A small subpleural nodular density in the superior segment of the right lower lobe on image 26 is stable. The lungs are otherwise clear. Biapical emphysematous changes are again noted.  CT ABDOMEN AND PELVIS FINDINGS  Low-density lesion inferiorly in the right hepatic lobe is stable, measuring 2.0 cm on image 84 and likely representing a hemangioma. No  new or enlarging liver lesions are identified. The gallbladder, spleen and pancreas appear normal.  Again demonstrated is a right adrenal nodule. This currently measures 2.3 x 1.6 cm on image 53, similar to the original study of 01/14/2013. A smaller left adrenal nodule is also unchanged. Both kidneys  appear normal. There is no hydronephrosis.  Aortoiliac atherosclerosis is again noted. There are no enlarged abdominal pelvic lymph nodes. There is no ascites or peritoneal nodularity. The stomach, small bowel and colon demonstrate no significant findings. There is no adnexal mass following partial hysterectomy. Both ovaries appear unchanged.  There are no worrisome osseous findings.  IMPRESSION: 1. Recurrent/progressive superior mediastinal and left supraclavicular lymphadenopathy worrisome for progressive metastatic lung cancer. Precarinal and subcarinal nodes have not significantly changed. 2. No abdominal pelvic metastatic disease identified. No residual abdominal adenopathy demonstrated. The bilateral adrenal nodules are unchanged and likely represent adenomas. 3. Stable liver lesion, most consistent with a hemangioma.   Electronically Signed   By: Camie Patience M.D.   On: 08/21/2013 15:11   Mr Jeri Cos ZS Contrast  09/07/2013   CLINICAL DATA:  Non-small cell lung cancer.  Staging  EXAM: MRI HEAD WITHOUT AND WITH CONTRAST  TECHNIQUE: Multiplanar, multiecho pulse sequences of the brain and surrounding structures were obtained without and with intravenous contrast.  CONTRAST:  85mL MULTIHANCE GADOBENATE DIMEGLUMINE 529 MG/ML IV SOLN  COMPARISON:  MRI head 02/02/2013  FINDINGS: There is abnormal edema throughout the cerebellar hemispheres bilaterally, right greater than left. Postcontrast, there is extensive enhancement throughout the subarachnoid space of the cerebellar hemispheres bilaterally consistent with metastatic disease. This appears to be primarily in the subarachnoid space and is consistent with  leptomeningeal carcinomatosis. There is enhancement within the fourth ventricle consistent with tumor. Mild mass effect on the fourth ventricle. Negative for hydrocephalus.  Small subtle areas of enhancement are present in both cerebral hemispheres also suggestive of leptomeningeal carcinomatosis. These are difficult to differentiate from parenchymal lesion but appear linear and most consistent with enhancement within the subarachnoid space involving the right frontal lobe and both parietal lobes and the right temporal lobe.  Negative for acute infarct. Negative for hemorrhage. No bony lesion identified. No shift of the midline structures.  IMPRESSION: Findings consistent with metastatic disease. This pattern is most consistent with leptomeningeal carcinomatosis, most prominent in the posterior fossa but also in the cerebral hemispheres bilaterally. There is considerable edema in the cerebellar hemispheres, right greater than left. There is mass effect on the fourth ventricle without hydrocephalus.   Electronically Signed   By: Franchot Gallo M.D.   On: 09/07/2013 13:51   Ct Abdomen Pelvis W Contrast  08/21/2013   CLINICAL DATA:  Non-small-cell lung cancer for restaging after completion of chemotherapy 2 months ago. Patient reports chest pain and shortness of breath.  EXAM: CT CHEST, ABDOMEN, AND PELVIS WITH CONTRAST  TECHNIQUE: Multidetector CT imaging of the chest, abdomen and pelvis was performed following the standard protocol during bolus administration of intravenous contrast.  CONTRAST:  154mL OMNIPAQUE IOHEXOL 300 MG/ML  SOLN  COMPARISON:  Prior examinations 06/13/2013 and 04/10/2013.  FINDINGS: CT CHEST FINDINGS  Left subclavian Port-A-Cath tip extends to the upper SVC and appears unchanged. There is progressive superior mediastinal lymphadenopathy. A superior right paratracheal node measuring 1.3 cm on image 16 previously measured 8 mm. There is a 1.4 cm node lateral to the ascending aorta on image 25  which previously measured 8 mm. A 1.4 cm precarinal node on image 24 and a 1.6 cm subcarinal node on image 29 are not significantly changed. There is no axillary or hilar adenopathy. However, a left supraclavicular node appears slightly larger, measuring 8 mm on image 6.  Previously demonstrated tiny pleural effusions have nearly resolved. A small subpleural nodular density in the superior segment of the  right lower lobe on image 26 is stable. The lungs are otherwise clear. Biapical emphysematous changes are again noted.  CT ABDOMEN AND PELVIS FINDINGS  Low-density lesion inferiorly in the right hepatic lobe is stable, measuring 2.0 cm on image 84 and likely representing a hemangioma. No new or enlarging liver lesions are identified. The gallbladder, spleen and pancreas appear normal.  Again demonstrated is a right adrenal nodule. This currently measures 2.3 x 1.6 cm on image 53, similar to the original study of 01/14/2013. A smaller left adrenal nodule is also unchanged. Both kidneys appear normal. There is no hydronephrosis.  Aortoiliac atherosclerosis is again noted. There are no enlarged abdominal pelvic lymph nodes. There is no ascites or peritoneal nodularity. The stomach, small bowel and colon demonstrate no significant findings. There is no adnexal mass following partial hysterectomy. Both ovaries appear unchanged.  There are no worrisome osseous findings.  IMPRESSION: 1. Recurrent/progressive superior mediastinal and left supraclavicular lymphadenopathy worrisome for progressive metastatic lung cancer. Precarinal and subcarinal nodes have not significantly changed. 2. No abdominal pelvic metastatic disease identified. No residual abdominal adenopathy demonstrated. The bilateral adrenal nodules are unchanged and likely represent adenomas. 3. Stable liver lesion, most consistent with a hemangioma.   Electronically Signed   By: Camie Patience M.D.   On: 08/21/2013 15:11       IMPRESSION: The patient has a  known diagnosis of metastatic non-small cell lung cancer. She has findings of intracranial disease seen on recent MRI scan of the brain. This was indicative of a pattern most consistent with leptomeningeal carcinomatosis. No intra-parenchymal disease seen. The patient has begun Decadron.  The patient I believe is an appropriate candidate for whole brain radiation treatment. I discussed her with the patient. She does live a couple of hours away from [place] although she stated that she wanted treatment here. I would therefore anticipate a 2 week course of treatment. I discussed with her the benefit of such a treatment as well as the possible side effects and risks of treatment as well. All of her questions were answered.  In reviewing the patient's case, I believe that aggressive treatment may be warranted. Given the findings of leptomeningeal carcinomatosis, I believe it would be reasonable to evaluate the C/T/L-spine as well.    PLAN: The patient will be scheduled for a simulation in the near future such that we can begin treatment planning. We will try to coordinate this with an upcoming MRI scan of the spine. Again, I anticipate 3 weeks of whole brain radiation treatment and this plan can be altered if necessary given findings from additional imaging.   I spent 60 minutes face to face with the patient and more than 50% of that time was spent in counseling and/or coordination of care.    ________________________________   Jodelle Gross, MD, PhD

## 2013-09-15 NOTE — Addendum Note (Signed)
Encounter addended by: Marye Round, MD on: 09/15/2013  9:58 AM<BR>     Documentation filed: Flowsheet VN, Orders

## 2013-09-16 ENCOUNTER — Inpatient Hospital Stay (HOSPITAL_COMMUNITY): Payer: BC Managed Care – PPO | Admitting: Anesthesiology

## 2013-09-16 ENCOUNTER — Inpatient Hospital Stay (HOSPITAL_COMMUNITY): Payer: BC Managed Care – PPO

## 2013-09-16 ENCOUNTER — Encounter (HOSPITAL_COMMUNITY): Payer: Self-pay | Admitting: Emergency Medicine

## 2013-09-16 ENCOUNTER — Emergency Department (HOSPITAL_COMMUNITY): Payer: BC Managed Care – PPO

## 2013-09-16 ENCOUNTER — Inpatient Hospital Stay (HOSPITAL_COMMUNITY)
Admission: EM | Admit: 2013-09-16 | Discharge: 2013-09-24 | DRG: 054 | Disposition: E | Payer: BC Managed Care – PPO | Attending: Internal Medicine | Admitting: Internal Medicine

## 2013-09-16 ENCOUNTER — Encounter (HOSPITAL_COMMUNITY): Payer: BC Managed Care – PPO | Admitting: Anesthesiology

## 2013-09-16 DIAGNOSIS — I959 Hypotension, unspecified: Secondary | ICD-10-CM | POA: Diagnosis present

## 2013-09-16 DIAGNOSIS — I10 Essential (primary) hypertension: Secondary | ICD-10-CM | POA: Diagnosis present

## 2013-09-16 DIAGNOSIS — Z808 Family history of malignant neoplasm of other organs or systems: Secondary | ICD-10-CM

## 2013-09-16 DIAGNOSIS — G911 Obstructive hydrocephalus: Secondary | ICD-10-CM | POA: Diagnosis present

## 2013-09-16 DIAGNOSIS — D649 Anemia, unspecified: Secondary | ICD-10-CM | POA: Diagnosis present

## 2013-09-16 DIAGNOSIS — Z87891 Personal history of nicotine dependence: Secondary | ICD-10-CM

## 2013-09-16 DIAGNOSIS — R42 Dizziness and giddiness: Secondary | ICD-10-CM | POA: Diagnosis present

## 2013-09-16 DIAGNOSIS — Z801 Family history of malignant neoplasm of trachea, bronchus and lung: Secondary | ICD-10-CM

## 2013-09-16 DIAGNOSIS — J9600 Acute respiratory failure, unspecified whether with hypoxia or hypercapnia: Secondary | ICD-10-CM | POA: Diagnosis not present

## 2013-09-16 DIAGNOSIS — E785 Hyperlipidemia, unspecified: Secondary | ICD-10-CM | POA: Diagnosis present

## 2013-09-16 DIAGNOSIS — C7931 Secondary malignant neoplasm of brain: Principal | ICD-10-CM | POA: Diagnosis present

## 2013-09-16 DIAGNOSIS — Z803 Family history of malignant neoplasm of breast: Secondary | ICD-10-CM

## 2013-09-16 DIAGNOSIS — J9601 Acute respiratory failure with hypoxia: Secondary | ICD-10-CM

## 2013-09-16 DIAGNOSIS — R4020 Unspecified coma: Secondary | ICD-10-CM

## 2013-09-16 DIAGNOSIS — R51 Headache: Secondary | ICD-10-CM | POA: Diagnosis present

## 2013-09-16 DIAGNOSIS — I4729 Other ventricular tachycardia: Secondary | ICD-10-CM | POA: Diagnosis not present

## 2013-09-16 DIAGNOSIS — Z833 Family history of diabetes mellitus: Secondary | ICD-10-CM

## 2013-09-16 DIAGNOSIS — E039 Hypothyroidism, unspecified: Secondary | ICD-10-CM | POA: Diagnosis present

## 2013-09-16 DIAGNOSIS — Z515 Encounter for palliative care: Secondary | ICD-10-CM

## 2013-09-16 DIAGNOSIS — C7949 Secondary malignant neoplasm of other parts of nervous system: Secondary | ICD-10-CM | POA: Diagnosis present

## 2013-09-16 DIAGNOSIS — I472 Ventricular tachycardia, unspecified: Secondary | ICD-10-CM | POA: Diagnosis not present

## 2013-09-16 DIAGNOSIS — E236 Other disorders of pituitary gland: Secondary | ICD-10-CM | POA: Diagnosis present

## 2013-09-16 DIAGNOSIS — K219 Gastro-esophageal reflux disease without esophagitis: Secondary | ICD-10-CM | POA: Diagnosis present

## 2013-09-16 DIAGNOSIS — E871 Hypo-osmolality and hyponatremia: Secondary | ICD-10-CM | POA: Diagnosis present

## 2013-09-16 DIAGNOSIS — R509 Fever, unspecified: Secondary | ICD-10-CM | POA: Diagnosis present

## 2013-09-16 DIAGNOSIS — J449 Chronic obstructive pulmonary disease, unspecified: Secondary | ICD-10-CM | POA: Diagnosis present

## 2013-09-16 DIAGNOSIS — F3289 Other specified depressive episodes: Secondary | ICD-10-CM | POA: Diagnosis present

## 2013-09-16 DIAGNOSIS — F411 Generalized anxiety disorder: Secondary | ICD-10-CM | POA: Diagnosis present

## 2013-09-16 DIAGNOSIS — C3490 Malignant neoplasm of unspecified part of unspecified bronchus or lung: Secondary | ICD-10-CM | POA: Diagnosis present

## 2013-09-16 DIAGNOSIS — F329 Major depressive disorder, single episode, unspecified: Secondary | ICD-10-CM | POA: Diagnosis present

## 2013-09-16 DIAGNOSIS — J4489 Other specified chronic obstructive pulmonary disease: Secondary | ICD-10-CM | POA: Diagnosis present

## 2013-09-16 LAB — BASIC METABOLIC PANEL
BUN: 16 mg/dL (ref 6–23)
BUN: 13 mg/dL (ref 6–23)
CALCIUM: 9.1 mg/dL (ref 8.4–10.5)
CALCIUM: 8.8 mg/dL (ref 8.4–10.5)
CHLORIDE: 89 meq/L — AB (ref 96–112)
CHLORIDE: 84 meq/L — AB (ref 96–112)
CO2: 28 meq/L (ref 19–32)
CO2: 23 mEq/L (ref 19–32)
CREATININE: 0.4 mg/dL — AB (ref 0.50–1.10)
Creatinine, Ser: 0.46 mg/dL — ABNORMAL LOW (ref 0.50–1.10)
GFR calc Af Amer: >90 mL/min (ref >90)
GFR calc non Af Amer: >90 mL/min (ref >90)
GFR calc non Af Amer: >90 mL/min (ref >90)
GLUCOSE: 117 mg/dL — AB (ref 70–99)
Glucose, Bld: 139 mg/dL — ABNORMAL HIGH (ref 70–99)
Potassium: 3.3 mEq/L — ABNORMAL LOW (ref 3.7–5.3)
Potassium: 3.4 mEq/L — ABNORMAL LOW (ref 3.7–5.3)
Sodium: 129 mEq/L — ABNORMAL LOW (ref 137–147)
Sodium: 125 mEq/L — ABNORMAL LOW (ref 137–147)

## 2013-09-16 LAB — CBC WITH DIFFERENTIAL/PLATELET
BASOS ABS: 0 10*3/uL (ref 0.0–0.1)
BASOS ABS: 0 10*3/uL (ref 0.0–0.1)
Basophils Relative: 0 % (ref 0–1)
Basophils Relative: 0 % (ref 0–1)
EOS PCT: 0 % (ref 0–5)
EOS PCT: 0 % (ref 0–5)
Eosinophils Absolute: 0 10*3/uL (ref 0.0–0.7)
Eosinophils Absolute: 0 10*3/uL (ref 0.0–0.7)
HCT: 32 % — ABNORMAL LOW (ref 36.0–46.0)
HEMATOCRIT: 37.3 % (ref 36.0–46.0)
Hemoglobin: 11 g/dL — ABNORMAL LOW (ref 12.0–15.0)
Hemoglobin: 13.1 g/dL (ref 12.0–15.0)
LYMPHS PCT: 5 % — AB (ref 12–46)
Lymphocytes Relative: 13 % (ref 12–46)
Lymphs Abs: 1.3 10*3/uL (ref 0.7–4.0)
Lymphs Abs: 0.5 10*3/uL — ABNORMAL LOW (ref 0.7–4.0)
MCH: 32 pg (ref 26.0–34.0)
MCH: 31.7 pg (ref 26.0–34.0)
MCHC: 34.4 g/dL (ref 30.0–36.0)
MCHC: 35.1 g/dL (ref 30.0–36.0)
MCV: 93 fL (ref 78.0–100.0)
MCV: 90.3 fL (ref 78.0–100.0)
MONO ABS: 0.6 10*3/uL (ref 0.1–1.0)
Monocytes Absolute: 1.1 10*3/uL — ABNORMAL HIGH (ref 0.1–1.0)
Monocytes Relative: 11 % (ref 3–12)
Monocytes Relative: 6 % (ref 3–12)
NEUTROS PCT: 76 % (ref 43–77)
Neutro Abs: 7.7 10*3/uL (ref 1.7–7.7)
Neutro Abs: 8.7 10*3/uL — ABNORMAL HIGH (ref 1.7–7.7)
Neutrophils Relative %: 89 % — ABNORMAL HIGH (ref 43–77)
Platelets: 265 10*3/uL (ref 150–400)
Platelets: 244 10*3/uL (ref 150–400)
RBC: 3.44 MIL/uL — ABNORMAL LOW (ref 3.87–5.11)
RBC: 4.13 MIL/uL (ref 3.87–5.11)
RDW: 12.4 % (ref 11.5–15.5)
RDW: 12.2 % (ref 11.5–15.5)
WBC: 10.1 10*3/uL (ref 4.0–10.5)
WBC: 9.7 10*3/uL (ref 4.0–10.5)

## 2013-09-16 LAB — TSH: TSH: 0.426 u[IU]/mL (ref 0.350–4.500)

## 2013-09-16 LAB — BLOOD GAS, ARTERIAL
Acid-Base Excess: 0.4 mmol/L (ref 0.0–2.0)
Bicarbonate: 25.5 mEq/L — ABNORMAL HIGH (ref 20.0–24.0)
DRAWN BY: 331471
FIO2: 1 %
LHR: 15 {breaths}/min
O2 Saturation: 99.4 %
PCO2 ART: 45.3 mmHg — AB (ref 35.0–45.0)
PEEP: 5 cmH2O
PH ART: 7.369 (ref 7.350–7.450)
PO2 ART: 314 mmHg — AB (ref 80.0–100.0)
Patient temperature: 98.6
TCO2: 23 mmol/L (ref 0–100)
VT: 380 mL

## 2013-09-16 LAB — HEPATIC FUNCTION PANEL
ALK PHOS: 70 U/L (ref 39–117)
ALT: 13 U/L (ref 0–35)
AST: 17 U/L (ref 0–37)
Albumin: 3.1 g/dL — ABNORMAL LOW (ref 3.5–5.2)
Bilirubin, Direct: <0.2 mg/dL (ref 0.0–0.3)
TOTAL PROTEIN: 6.9 g/dL (ref 6.0–8.3)
Total Bilirubin: 0.3 mg/dL (ref 0.3–1.2)

## 2013-09-16 LAB — LACTIC ACID, PLASMA: LACTIC ACID, VENOUS: 1.3 mmol/L (ref 0.5–2.2)

## 2013-09-16 LAB — PROCALCITONIN: PROCALCITONIN: 5.25 ng/mL

## 2013-09-16 LAB — GLUCOSE, CAPILLARY
GLUCOSE-CAPILLARY: 162 mg/dL — AB (ref 70–99)
GLUCOSE-CAPILLARY: 156 mg/dL — AB (ref 70–99)

## 2013-09-16 LAB — CK TOTAL AND CKMB (NOT AT ARMC)
CK, MB: 2.4 ng/mL (ref 0.3–4.0)
RELATIVE INDEX: INVALID (ref 0.0–2.5)
Total CK: 96 U/L (ref 7–177)

## 2013-09-16 LAB — TRIGLYCERIDES: TRIGLYCERIDES: 118 mg/dL (ref <150)

## 2013-09-16 LAB — MAGNESIUM: MAGNESIUM: 1.1 mg/dL — AB (ref 1.5–2.5)

## 2013-09-16 LAB — TROPONIN I: TROPONIN I: 0.36 ng/mL — AB (ref <0.30)

## 2013-09-16 LAB — MRSA PCR SCREENING: MRSA by PCR: NEGATIVE

## 2013-09-16 LAB — PHOSPHORUS: Phosphorus: 3.1 mg/dL (ref 2.3–4.6)

## 2013-09-16 MED ORDER — LORAZEPAM 1 MG PO TABS: 1.0000 mg | ORAL_TABLET | ORAL | Status: DC | PRN

## 2013-09-16 MED ORDER — DOCUSATE SODIUM 100 MG PO CAPS
100.0000 mg | ORAL_CAPSULE | ORAL | Status: DC | PRN
  Filled 2013-09-16: qty 1

## 2013-09-16 MED ORDER — PROPOFOL 10 MG/ML IV EMUL
0.0000 ug/kg/min | INTRAVENOUS | Status: DC
  Administered 2013-09-16: 20 ug/kg/min via INTRAVENOUS
  Filled 2013-09-16: qty 100

## 2013-09-16 MED ORDER — ATENOLOL 50 MG PO TABS
50.0000 mg | ORAL_TABLET | Freq: Two times a day (BID) | ORAL | Status: DC
  Filled 2013-09-16 (×4): qty 1

## 2013-09-16 MED ORDER — OXYCODONE HCL 5 MG PO TABS: 30.0000 mg | ORAL_TABLET | ORAL | Status: DC | PRN

## 2013-09-16 MED ORDER — SIMVASTATIN 40 MG PO TABS
40.0000 mg | ORAL_TABLET | Freq: Every evening | ORAL | Status: DC
  Filled 2013-09-16: qty 1

## 2013-09-16 MED ORDER — SODIUM CHLORIDE 0.9 % IJ SOLN
3.0000 mL | Freq: Two times a day (BID) | INTRAMUSCULAR | Status: DC
Start: 2013-09-16 — End: 2013-09-17

## 2013-09-16 MED ORDER — INSULIN ASPART 100 UNIT/ML ~~LOC~~ SOLN
2.0000 [IU] | SUBCUTANEOUS | Status: DC
  Administered 2013-09-16: 4 [IU] via SUBCUTANEOUS
  Administered 2013-09-17: 2 [IU] via SUBCUTANEOUS

## 2013-09-16 MED ORDER — ACETAMINOPHEN 160 MG/5ML PO SOLN
650.0000 mg | Freq: Four times a day (QID) | ORAL | Status: DC | PRN
  Administered 2013-09-16: 650 mg
  Filled 2013-09-16 (×2): qty 20.3

## 2013-09-16 MED ORDER — SODIUM CHLORIDE 0.9 % IV SOLN
1200.0000 mg | Freq: Once | INTRAVENOUS | Status: AC
  Administered 2013-09-16: 1200 mg via INTRAVENOUS
  Filled 2013-09-16: qty 24

## 2013-09-16 MED ORDER — LORATADINE 10 MG PO TABS
10.0000 mg | ORAL_TABLET | Freq: Every day | ORAL | Status: DC
  Filled 2013-09-16 (×2): qty 1

## 2013-09-16 MED ORDER — ONDANSETRON HCL 4 MG PO TABS: 8.0000 mg | ORAL_TABLET | Freq: Three times a day (TID) | ORAL | Status: DC | PRN

## 2013-09-16 MED ORDER — BIOTENE DRY MOUTH MT LIQD
15.0000 mL | Freq: Four times a day (QID) | OROMUCOSAL | Status: DC
  Administered 2013-09-16 – 2013-09-17 (×3): 15 mL via OROMUCOSAL

## 2013-09-16 MED ORDER — ALPRAZOLAM 0.25 MG PO TABS: 0.2500 mg | ORAL_TABLET | Freq: Three times a day (TID) | ORAL | Status: DC | PRN

## 2013-09-16 MED ORDER — ONDANSETRON HCL 4 MG/2ML IJ SOLN: 4.0000 mg | Freq: Four times a day (QID) | INTRAMUSCULAR | Status: DC | PRN

## 2013-09-16 MED ORDER — MIDAZOLAM HCL 2 MG/2ML IJ SOLN
INTRAMUSCULAR | Status: AC
  Administered 2013-09-16: 4 mg
  Filled 2013-09-16: qty 4

## 2013-09-16 MED ORDER — ADULT MULTIVITAMIN W/MINERALS CH
1.0000 | ORAL_TABLET | Freq: Every day | ORAL | Status: DC
  Filled 2013-09-16: qty 1

## 2013-09-16 MED ORDER — ENOXAPARIN SODIUM 40 MG/0.4ML ~~LOC~~ SOLN
40.0000 mg | SUBCUTANEOUS | Status: DC
  Filled 2013-09-16: qty 0.4

## 2013-09-16 MED ORDER — ACETAMINOPHEN 650 MG RE SUPP: 650.0000 mg | Freq: Four times a day (QID) | RECTAL | Status: DC | PRN

## 2013-09-16 MED ORDER — HYDROMORPHONE HCL PF 1 MG/ML IJ SOLN: 1.0000 mg | INTRAMUSCULAR | Status: DC | PRN

## 2013-09-16 MED ORDER — MECLIZINE HCL 25 MG PO TABS
25.0000 mg | ORAL_TABLET | ORAL | Status: DC | PRN
  Filled 2013-09-16: qty 1

## 2013-09-16 MED ORDER — DEXAMETHASONE SODIUM PHOSPHATE 10 MG/ML IJ SOLN
10.0000 mg | Freq: Once | INTRAMUSCULAR | Status: AC
  Administered 2013-09-16: 10 mg via INTRAVENOUS
  Filled 2013-09-16: qty 1

## 2013-09-16 MED ORDER — OXYBUTYNIN CHLORIDE ER 10 MG PO TB24
10.0000 mg | ORAL_TABLET | Freq: Every day | ORAL | Status: DC
  Filled 2013-09-16: qty 1

## 2013-09-16 MED ORDER — SODIUM CHLORIDE 0.9 % IV BOLUS (SEPSIS)
500.0000 mL | Freq: Once | INTRAVENOUS | Status: AC
  Administered 2013-09-16: 500 mL via INTRAVENOUS

## 2013-09-16 MED ORDER — SACCHAROMYCES BOULARDII 250 MG PO CAPS
250.0000 mg | ORAL_CAPSULE | Freq: Two times a day (BID) | ORAL | Status: DC
  Administered 2013-09-16: 250 mg via ORAL
  Filled 2013-09-16 (×4): qty 1

## 2013-09-16 MED ORDER — CARISOPRODOL 350 MG PO TABS: 350.0000 mg | ORAL_TABLET | Freq: Four times a day (QID) | ORAL | Status: DC | PRN

## 2013-09-16 MED ORDER — PROCHLORPERAZINE MALEATE 10 MG PO TABS
10.0000 mg | ORAL_TABLET | Freq: Four times a day (QID) | ORAL | Status: DC | PRN
  Filled 2013-09-16: qty 1

## 2013-09-16 MED ORDER — HYDROMORPHONE HCL PF 1 MG/ML IJ SOLN
1.0000 mg | Freq: Once | INTRAMUSCULAR | Status: AC
  Administered 2013-09-16: 1 mg via INTRAVENOUS
  Filled 2013-09-16: qty 1

## 2013-09-16 MED ORDER — PANTOPRAZOLE SODIUM 40 MG IV SOLR
40.0000 mg | INTRAVENOUS | Status: DC
  Filled 2013-09-16 (×2): qty 40

## 2013-09-16 MED ORDER — IBUPROFEN 800 MG PO TABS
800.0000 mg | ORAL_TABLET | Freq: Four times a day (QID) | ORAL | Status: DC | PRN
  Filled 2013-09-16: qty 1

## 2013-09-16 MED ORDER — ONDANSETRON HCL 4 MG PO TABS: 4.0000 mg | ORAL_TABLET | Freq: Four times a day (QID) | ORAL | Status: DC | PRN

## 2013-09-16 MED ORDER — FENTANYL CITRATE 0.05 MG/ML IJ SOLN: 100.0000 ug | INTRAMUSCULAR | Status: DC | PRN

## 2013-09-16 MED ORDER — DESONIDE 0.05 % EX OINT
1.0000 "application " | TOPICAL_OINTMENT | Freq: Two times a day (BID) | CUTANEOUS | Status: DC
  Filled 2013-09-16: qty 15

## 2013-09-16 MED ORDER — LISINOPRIL 20 MG PO TABS
30.0000 mg | ORAL_TABLET | Freq: Every day | ORAL | Status: DC
  Filled 2013-09-16 (×2): qty 1

## 2013-09-16 MED ORDER — PROCHLORPERAZINE EDISYLATE 5 MG/ML IJ SOLN: 10.0000 mg | Freq: Four times a day (QID) | INTRAMUSCULAR | Status: DC | PRN

## 2013-09-16 MED ORDER — LEVOTHYROXINE SODIUM 50 MCG PO TABS
50.0000 ug | ORAL_TABLET | Freq: Every day | ORAL | Status: DC
  Filled 2013-09-16 (×3): qty 1

## 2013-09-16 MED ORDER — CALCIUM CARBONATE-VITAMIN D 500-200 MG-UNIT PO TABS
1.0000 | ORAL_TABLET | Freq: Every morning | ORAL | Status: DC
  Filled 2013-09-16 (×2): qty 1

## 2013-09-16 MED ORDER — LAMOTRIGINE 100 MG PO TABS
100.0000 mg | ORAL_TABLET | Freq: Two times a day (BID) | ORAL | Status: DC
  Administered 2013-09-16: 100 mg via ORAL
  Filled 2013-09-16 (×4): qty 1

## 2013-09-16 MED ORDER — PHENYTOIN SODIUM 50 MG/ML IJ SOLN
100.0000 mg | Freq: Three times a day (TID) | INTRAMUSCULAR | Status: DC
  Administered 2013-09-17: 100 mg via INTRAVENOUS
  Filled 2013-09-16 (×6): qty 2

## 2013-09-16 MED ORDER — FUSION PLUS PO CAPS: 1.0000 | ORAL_CAPSULE | Freq: Every day | ORAL | Status: DC

## 2013-09-16 MED ORDER — PHENYLEPHRINE HCL 10 MG/ML IJ SOLN
30.0000 ug/min | INTRAVENOUS | Status: DC
  Administered 2013-09-16: 75 ug/min via INTRAVENOUS
  Administered 2013-09-16: 40 ug/min via INTRAVENOUS
  Filled 2013-09-16 (×3): qty 1

## 2013-09-16 MED ORDER — PROCHLORPERAZINE 25 MG RE SUPP
25.0000 mg | Freq: Two times a day (BID) | RECTAL | Status: DC | PRN
  Filled 2013-09-16: qty 1

## 2013-09-16 MED ORDER — ACETAMINOPHEN 325 MG PO TABS: 650.0000 mg | ORAL_TABLET | Freq: Four times a day (QID) | ORAL | Status: DC | PRN

## 2013-09-16 MED ORDER — SUCCINYLCHOLINE CHLORIDE 20 MG/ML IJ SOLN
INTRAMUSCULAR | Status: DC | PRN
  Administered 2013-09-16: 100 mg via INTRAVENOUS

## 2013-09-16 MED ORDER — PIPERACILLIN-TAZOBACTAM 3.375 G IVPB
3.3750 g | Freq: Three times a day (TID) | INTRAVENOUS | Status: DC
  Administered 2013-09-16 – 2013-09-17 (×2): 3.375 g via INTRAVENOUS
  Filled 2013-09-16 (×4): qty 50

## 2013-09-16 MED ORDER — FENTANYL CITRATE 0.05 MG/ML IJ SOLN
INTRAMUSCULAR | Status: AC
  Administered 2013-09-16: 100 ug
  Filled 2013-09-16: qty 2

## 2013-09-16 MED ORDER — CHLORHEXIDINE GLUCONATE 0.12 % MT SOLN
15.0000 mL | Freq: Two times a day (BID) | OROMUCOSAL | Status: DC
  Administered 2013-09-17: 15 mL via OROMUCOSAL

## 2013-09-16 MED ORDER — LABETALOL HCL 5 MG/ML IV SOLN: 10.0000 mg | INTRAVENOUS | Status: DC | PRN

## 2013-09-16 MED ORDER — DEXAMETHASONE SODIUM PHOSPHATE 4 MG/ML IJ SOLN
8.0000 mg | Freq: Two times a day (BID) | INTRAMUSCULAR | Status: DC
  Administered 2013-09-16: 8 mg via INTRAVENOUS
  Filled 2013-09-16 (×4): qty 2

## 2013-09-16 MED ORDER — VANCOMYCIN HCL 10 G IV SOLR
1250.0000 mg | Freq: Two times a day (BID) | INTRAVENOUS | Status: DC
  Administered 2013-09-16 – 2013-09-17 (×2): 1250 mg via INTRAVENOUS
  Filled 2013-09-16 (×3): qty 1250

## 2013-09-16 MED ORDER — FLUOXETINE HCL 20 MG PO CAPS
60.0000 mg | ORAL_CAPSULE | Freq: Every day | ORAL | Status: DC
  Filled 2013-09-16: qty 3

## 2013-09-16 MED ORDER — PANTOPRAZOLE SODIUM 40 MG PO TBEC: 80.0000 mg | DELAYED_RELEASE_TABLET | Freq: Every day | ORAL | Status: DC

## 2013-09-16 NOTE — ED Notes (Signed)
Per husband at bedside pt has lung cancer found out this week mets to brain,  New onset confusion

## 2013-09-16 NOTE — Progress Notes (Signed)
MEDICATION RELATED CONSULT NOTE - INITIAL   Pharmacy Consult for Phenytoin Indication: Possible seizure  No Known Allergies  Patient Measurements: Height: 5\' 1"  (154.9 cm) Weight: 171 lb 4.8 oz (77.7 kg) IBW/kg (Calculated) : 47.8 Adjusted Body Weight: 60 kg  Vital Signs: Temp: 98.3 F (36.8 C) (01/24 1217) Temp src: Oral (01/24 1217) BP: 169/92 mmHg (01/24 1217) Pulse Rate: 81 (01/24 1217) Intake/Output from previous day:   Intake/Output from this shift:    Labs:  Recent Labs  09/15/2013 0335  WBC 10.1  HGB 11.0*  HCT 32.0*  PLT 265  CREATININE 0.46*   Estimated Creatinine Clearance: 75.9 ml/min (by C-G formula based on Cr of 0.46).   Microbiology: No results found for this or any previous visit (from the past 720 hour(s)).  Medical History: Past Medical History  Diagnosis Date  . Hypertension   . Hypothyroidism   . Anxiety   . Depression   . GERD (gastroesophageal reflux disease)   . Cancer     non- small cell squamous  . Shortness of breath   . COPD (chronic obstructive pulmonary disease)   . Headache(784.0)   . Arthritis     Hx; of osteoarthritis  . Diverticulosis     Hx: of    Medications:  Prescriptions prior to admission  Medication Sig Dispense Refill  . ALPRAZolam (XANAX) 0.25 MG tablet Take 1 tablet (0.25 mg total) by mouth 3 (three) times daily as needed for anxiety.  30 tablet  0  . atenolol (TENORMIN) 50 MG tablet 50 mg 2 (two) times daily. One daily      . calcium-vitamin D (OSCAL WITH D) 500-200 MG-UNIT per tablet Take 1 tablet by mouth every morning.      . carisoprodol (SOMA) 350 MG tablet Take 350 mg by mouth 4 (four) times daily as needed for muscle spasms.      Marland Kitchen desonide (DESOWEN) 0.05 % ointment Apply 1 application topically 2 (two) times daily.       Marland Kitchen dexamethasone (DECADRON) 4 MG tablet Take 1 tablet (4 mg total) by mouth 2 (two) times daily.  40 tablet  1  . docusate sodium (COLACE) 100 MG capsule Take 100 mg by mouth as  needed for constipation.      Marland Kitchen esomeprazole (NEXIUM) 40 MG capsule Take 30-60 min before first meal of the day      . estradiol (ESTRACE) 2 MG tablet Take 2 mg by mouth every morning.       . fexofenadine (ALLEGRA) 180 MG tablet Take 180 mg by mouth daily as needed (alleriges).       Marland Kitchen FLUoxetine (PROZAC) 20 MG capsule Take 60 mg by mouth daily.       Marland Kitchen ibuprofen (ADVIL,MOTRIN) 800 MG tablet Take 800 mg by mouth every 6 (six) hours as needed.       . Iron-FA-B Cmp-C-Biot-Probiotic (FUSION PLUS) CAPS Take 1 capsule by mouth daily.  30 capsule  3  . Isometheptene-Caffeine-APAP 65-20-325 MG TABS Take 1 tablet by mouth every 6 (six) hours as needed.       . lamoTRIgine (LAMICTAL) 100 MG tablet Take 100 mg by mouth 2 (two) times daily.       Marland Kitchen levothyroxine (SYNTHROID, LEVOTHROID) 50 MCG tablet Take 50 mcg by mouth daily before breakfast.      . lidocaine-prilocaine (EMLA) cream Apply 1 application topically as needed (to port site).  30 g  0  . lisinopril (PRINIVIL,ZESTRIL) 30 MG tablet Take 30 mg  by mouth daily.      Marland Kitchen LORazepam (ATIVAN) 1 MG tablet Take 1 mg by mouth every 4 (four) hours as needed.       . meclizine (ANTIVERT) 25 MG tablet Take 25 mg by mouth as needed.      . Multiple Vitamin (MULTIVITAMIN) tablet Take 1 tablet by mouth daily.      . NONFORMULARY OR COMPOUNDED ITEM Apply 1 application topically 3 (three) times daily as needed (for pain). Special compounded cream from [facility] in [place], [place] ; contains ketamine, ketoprofen, amitriptyline.      . ondansetron (ZOFRAN) 8 MG tablet Take 8 mg by mouth every 8 (eight) hours as needed.      Marland Kitchen oxybutynin (DITROPAN-XL) 10 MG 24 hr tablet Take 10 mg by mouth at bedtime.      Marland Kitchen oxycodone (ROXICODONE) 30 MG immediate release tablet Take 30 mg by mouth every 4 (four) hours as needed for pain.      Marland Kitchen prochlorperazine (COMPAZINE) 10 MG tablet Take 1 tablet (10 mg total) by mouth every 6 (six) hours as needed (nausea).  60 tablet  0  .  prochlorperazine (COMPAZINE) 25 MG suppository Place 1 suppository (25 mg total) rectally every 12 (twelve) hours as needed for nausea.  12 suppository  0  . simvastatin (ZOCOR) 40 MG tablet Take 40 mg by mouth every evening.       Scheduled:  . antiseptic oral rinse  15 mL Mouth Rinse QID  . atenolol  50 mg Oral BID  . calcium-vitamin D  1 tablet Oral q morning - 10a  . chlorhexidine  15 mL Mouth Rinse BID  . desonide  1 application Topical BID  . dexamethasone  8 mg Intravenous Q12H  . fentaNYL      . lamoTRIgine  100 mg Oral BID  . levothyroxine  50 mcg Oral QAC breakfast  . lisinopril  30 mg Oral Daily  . loratadine  10 mg Oral Daily  . midazolam      . pantoprazole (PROTONIX) IV  40 mg Intravenous Q24H  . saccharomyces boulardii  250 mg Oral BID  . sodium chloride  3 mL Intravenous Q12H  . sodium chloride  3 mL Intravenous Q12H   Infusions:  . propofol      Assessment: 53 yo female with stage 4 lung cancer and leptomeningeal carcinomatosis, undergoing whole brain XRT. Presents 1/24 with headache, confusion and difficulty walking, subsequently transferred to ICU requiring intubation after being found unresponsive on the floor. Patient was taking lamictal PTA.  Goal of Therapy:  Total phenytoin levels 10-20 mcg/ml  Plan:   Phenytoin 1200mg  IV x 1 (~20 mg/kg adjusted body weight)  Phenytoin 100mg  IV q8h (~5 mg/kg/day) - begin 12 hrs after initial load  Plan to check phenytoin level and serum albumin at steady state in 4-5 days, sooner if renal function changes or seizure activity ocurrs  Peggyann Juba, PharmD, BCPS Pager: [phone] 09/23/2013,1:57 PM

## 2013-09-16 NOTE — Progress Notes (Signed)
On change of shift patient blood pressures hypotensive 62/33. CCM MD Zubelevitskiy aware. 1000cc bolus continuing to infuse. Neo ordered. Pt experienced runs of Vtach. Code cart moved near room. Family present at this time. Will continue to monitor.

## 2013-09-16 NOTE — Consult Note (Signed)
ANTIBIOTIC CONSULT NOTE - INITIAL  Pharmacy Consult for Zosyn/Vancomycin Indication: rule out pneumonia  No Known Allergies  Patient Measurements: Height: 5\' 1"  (154.9 cm) Weight: 171 lb 4.8 oz (77.7 kg) IBW/kg (Calculated) : 47.8  Vital Signs: Temp: 100.2 F (37.9 C) (01/24 1900) Temp src: Oral (01/24 1217) BP: 63/27 mmHg (01/24 1900) Pulse Rate: 73 (01/24 1900) Intake/Output from previous day:   Intake/Output from this shift:    Labs:  Recent Labs  08/24/2013 0335 09/11/2013 1501  WBC 10.1 9.7  HGB 11.0* 13.1  PLT 265 244  CREATININE 0.46* 0.40*   Estimated Creatinine Clearance: 75.9 ml/min (by C-G formula based on Cr of 0.4). No results found for this basename: VANCOTROUGH, Corlis Leak, VANCORANDOM, Hawkins, GENTPEAK, GENTRANDOM, TOBRATROUGH, TOBRAPEAK, TOBRARND, AMIKACINPEAK, AMIKACINTROU, AMIKACIN,  in the last 72 hours   Microbiology: Recent Results (from the past 720 hour(s))  MRSA PCR SCREENING     Status: None   Collection Time    09/22/2013  3:00 PM      Result Value Range Status   MRSA by PCR NEGATIVE  NEGATIVE Final   Comment:            The GeneXpert MRSA Assay (FDA     approved for NASAL specimens     only), is one component of a     comprehensive MRSA colonization     surveillance program. It is not     intended to diagnose MRSA     infection nor to guide or     monitor treatment for     MRSA infections.    Medical History: Past Medical History  Diagnosis Date  . Hypertension   . Hypothyroidism   . Anxiety   . Depression   . GERD (gastroesophageal reflux disease)   . Cancer     non- small cell squamous  . Shortness of breath   . COPD (chronic obstructive pulmonary disease)   . Headache(784.0)   . Arthritis     Hx; of osteoarthritis  . Diverticulosis     Hx: of    Medications:  Scheduled:  . antiseptic oral rinse  15 mL Mouth Rinse QID  . atenolol  50 mg Oral BID  . calcium-vitamin D  1 tablet Oral q morning - 10a  .  chlorhexidine  15 mL Mouth Rinse BID  . desonide  1 application Topical BID  . dexamethasone  8 mg Intravenous Q12H  . insulin aspart  2-6 Units Subcutaneous Q4H  . lamoTRIgine  100 mg Oral BID  . levothyroxine  50 mcg Oral QAC breakfast  . lisinopril  30 mg Oral Daily  . loratadine  10 mg Oral Daily  . pantoprazole (PROTONIX) IV  40 mg Intravenous Q24H  . phenytoin (DILANTIN) IV  100 mg Intravenous Q8H  . piperacillin-tazobactam (ZOSYN)  IV  3.375 g Intravenous Q8H  . saccharomyces boulardii  250 mg Oral BID  . sodium chloride  3 mL Intravenous Q12H  . sodium chloride  3 mL Intravenous Q12H  . vancomycin  1,250 mg Intravenous Q12H   Infusions:  . phenylephrine (NEO-SYNEPHRINE) Adult infusion    . 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  Vancomycin and Zosyn for suspected aspiration PNA.  Goal of Therapy:  Vancomycin trough level 15-20 mcg/ml  Plan:   Zosyn 3.375 Gm IV q8h EI infusion  Vancomycin 1250mg  IV q12h  F/u SCr/levels/cultures as needed.  Nyoka Cowden,  Radonna Bracher R 09/14/2013,7:37 PM

## 2013-09-16 NOTE — Progress Notes (Addendum)
H and P from this AM reviewed. 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 The patient was reportedly started on decadron one week prior. In the Ed, the patient was noted to have radiographic evidence of hydrocephalus and admitted with increased dose of decadron. During her hospital course, the patient was noted by staff to be spontaneously unresponsive and not protecting her airway. An RRT was called and the pt was brought to the ICU where she was intubated by anesthesia for airway support. Critical care was consulted. The patient currently remains on the ventilator. Care has since been transferred to [facility] service.  Pt's husband was contacted and updated on patient's worsening condition. Pt's code status was confirmed with husband to be FULL CODE. Pt's husband is currently en route to the hospital at this time.

## 2013-09-16 NOTE — Progress Notes (Signed)
[facility] Progress Note Patient Name: Delitha Elms DOB: 03/02/1961 MRN: [id number]  Date of Service  09/22/2013   HPI/Events of Note   Hypotension, no response to fluids   eICU Interventions   Neo-Synephrine gtt   Intervention Category Major Interventions: Hypertension - evaluation and management  Jaiyah Beining 09/05/2013, 7:29 PM

## 2013-09-16 NOTE — H&P (Addendum)
[facility] History and Physical  Jodi Shepherd YWV:[id number] DOB: 1960/12/05 DOA: 08/30/2013  Referring physician: ER physician. PCP: Tressa Busman., MD  Specialists: Dr. Inda Merlin. Oncologist.  Chief Complaint: Headache and confusion.  HPI: Jodi Shepherd is a 53 y.o. 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 Patient had an MRI on January 15 of this month and on admission tonight patient had CT head done which shows features in addition to the known metastasis concerning for developing hydrocephalus. On-call oncologist Dr. Learta Codding was consulted by the ER physician and Dr. Learta Codding has advised to increase Decadron to 8 mg IV every 12 hourly after a bolus of 10 mg. Patient is on a schedule to have brain radiation by Dr. Lisbeth Renshaw later. Patient otherwise denies any shortness of breath chest pain fever chills abdominal pain diarrhea. As per patient's husband patient has been finding it increasingly difficult to walk.  Review of Systems: As presented in the history of presenting illness, rest negative.  Past Medical History  Diagnosis Date  . Hypertension   . Hypothyroidism   . Anxiety   . Depression   . GERD (gastroesophageal reflux disease)   . Cancer     non- small cell squamous  . Shortness of breath   . COPD (chronic obstructive pulmonary disease)   . Headache(784.0)   . Arthritis     Hx; of osteoarthritis  . Diverticulosis     Hx: of   Past Surgical History  Procedure Laterality Date  . Abdominal hysterectomy    . Inner ear surgery    . Right -sided thoracic outlet surgery      Hx: of  . Colonoscopy w/ biopsies and polypectomy      Hx: of  . Rectal fistula repair      Hx: of  . Hemorrhoid surgery      Hx: of  . Portacath placement Bilateral 02/16/2013   Procedure: INSERTION PORT-A-CATH;  Surgeon: Ivin Poot, MD;  Location: [facility];  Service: Thoracic;  Laterality: Bilateral;   Social History:  reports that she quit smoking about 8 months ago. Her smoking use included Cigarettes. She has a 20 pack-year smoking history. She has never used smokeless tobacco. She reports that she does not drink alcohol or use illicit drugs. Where does patient live home. Can patient participate in ADLs? Yes.  No Known Allergies  Family History:  Family History  Problem Relation Age of Onset  . Diabetes Mellitus II Father   . Heart failure Sister   . Emphysema Father     smoked  . Rheum arthritis Maternal Grandmother   . Skin cancer Mother   . Brain cancer Maternal Aunt   . Lung cancer Maternal Aunt     smoked  . Breast cancer Maternal Aunt       Prior to Admission medications   Medication Sig Start Date End Date Taking? Authorizing Provider  ALPRAZolam (XANAX) 0.25 MG tablet Take 1 tablet (0.25 mg total) by mouth 3 (three) times daily as needed for anxiety. 01/15/13  Yes Debbe Odea, MD  atenolol (TENORMIN) 50 MG tablet 50 mg 2 (two) times daily. One daily 07/12/13  Yes Tanda Rockers, MD  calcium-vitamin D (OSCAL WITH D) 500-200 MG-UNIT per tablet Take 1 tablet by mouth every morning.   Yes Historical Provider, MD  carisoprodol (SOMA) 350 MG tablet Take 350 mg by mouth 4 (four) times daily as needed for muscle spasms.   Yes Historical Provider, MD  desonide (DESOWEN) 0.05 % ointment Apply 1 application topically 2 (two) times daily.  08/28/13  Yes Historical Provider, MD  dexamethasone (DECADRON) 4 MG tablet Take 1 tablet (4 mg total) by mouth 2 (two) times daily. 09/07/13  Yes Curt Bears, MD  docusate sodium (COLACE) 100 MG capsule Take 100 mg by mouth as needed for constipation.   Yes Historical Provider, MD  esomeprazole (NEXIUM) 40 MG capsule Take 30-60 min before first meal of the day 07/12/13  Yes Tanda Rockers, MD  estradiol (ESTRACE) 2 MG  tablet Take 2 mg by mouth every morning.    Yes Historical Provider, MD  fexofenadine (ALLEGRA) 180 MG tablet Take 180 mg by mouth daily as needed (alleriges).    Yes Historical Provider, MD  FLUoxetine (PROZAC) 20 MG capsule Take 60 mg by mouth daily.  12/19/12  Yes Historical Provider, MD  ibuprofen (ADVIL,MOTRIN) 800 MG tablet Take 800 mg by mouth every 6 (six) hours as needed.  07/27/13  Yes Historical Provider, MD  Iron-FA-B Cmp-C-Biot-Probiotic (FUSION PLUS) CAPS Take 1 capsule by mouth daily. 06/06/13  Yes Adrena E Johnson, PA-C  Isometheptene-Caffeine-APAP 65-20-325 MG TABS Take 1 tablet by mouth every 6 (six) hours as needed.  09/12/13  Yes Historical Provider, MD  lamoTRIgine (LAMICTAL) 100 MG tablet Take 100 mg by mouth 2 (two) times daily.  12/19/12  Yes Historical Provider, MD  levothyroxine (SYNTHROID, LEVOTHROID) 50 MCG tablet Take 50 mcg by mouth daily before breakfast.   Yes Historical Provider, MD  lidocaine-prilocaine (EMLA) cream Apply 1 application topically as needed (to port site). 08/28/13  Yes Curt Bears, MD  lisinopril (PRINIVIL,ZESTRIL) 30 MG tablet Take 30 mg by mouth daily. 08/12/13  Yes Historical Provider, MD  LORazepam (ATIVAN) 1 MG tablet Take 1 mg by mouth every 4 (four) hours as needed.  09/11/13  Yes Historical Provider, MD  meclizine (ANTIVERT) 25 MG tablet Take 25 mg by mouth as needed. 08/30/13  Yes Historical Provider, MD  Multiple Vitamin (MULTIVITAMIN) tablet Take 1 tablet by mouth daily.   Yes Historical Provider, MD  NONFORMULARY OR COMPOUNDED ITEM Apply 1 application topically 3 (three) times daily as needed (for pain). Special compounded cream from [facility] in [place], [place] ; contains ketamine, ketoprofen, amitriptyline.   Yes Historical Provider, MD  ondansetron (ZOFRAN) 8 MG tablet Take 8 mg by mouth every 8 (eight) hours as needed. 06/14/13  Yes Historical Provider, MD  oxybutynin (DITROPAN-XL) 10 MG 24 hr tablet Take 10 mg by mouth at bedtime.   Yes  Historical Provider, MD  oxycodone (ROXICODONE) 30 MG immediate release tablet Take 30 mg by mouth every 4 (four) hours as needed for pain.   Yes Historical Provider, MD  prochlorperazine (COMPAZINE) 10 MG tablet Take 1 tablet (10 mg total) by mouth every 6 (six) hours as needed (nausea). 05/09/13  Yes Curt Bears, MD  prochlorperazine (COMPAZINE) 25 MG suppository Place 1 suppository (25 mg total) rectally every 12 (twelve) hours as needed for nausea. 03/07/13  Yes Curt Bears, MD  simvastatin (ZOCOR) 40 MG tablet Take 40 mg by mouth every evening.   Yes Historical Provider, MD    Physical Exam: Filed Vitals:   09/02/2013 0300 08/29/2013 0451  BP: 180/86 179/86  Pulse: 56 55  Temp: 98.5 F (36.9 C)   TempSrc: Oral   Resp: 20 18  SpO2: 96% 93%     General:  Well-developed well-nourished.  Eyes: Anicteric no pallor.  ENT: No discharge from the ears eyes nose mouth.  Neck: No mass felt.  Cardiovascular: S1-S2 heard.  Respiratory: No rhonchi or crepitations.  Abdomen: Soft nontender bowel sounds present.  Skin: No rash.  Musculoskeletal: No edema.  Psychiatric: Appears normal though mildly drowsy.  Neurologic: Patient is oriented to time place and person but mildly drowsy. Moves all extremities.  Labs on Admission:  Basic Metabolic Panel:  Recent Labs Lab 09/23/2013 0335  NA 129*  K 3.3*  CL 89*  CO2 28  GLUCOSE 117*  BUN 16  CREATININE 0.46*  CALCIUM 9.1   Liver Function Tests: No results found for this basename: AST, ALT, ALKPHOS, BILITOT, PROT, ALBUMIN,  in the last 168 hours No results found for this basename: LIPASE, AMYLASE,  in the last 168 hours No results found for this basename: AMMONIA,  in the last 168 hours CBC:  Recent Labs Lab 09/11/2013 0335  WBC 10.1  NEUTROABS 7.7  HGB 11.0*  HCT 32.0*  MCV 93.0  PLT 265   Cardiac Enzymes: No results found for this basename: CKTOTAL, CKMB, CKMBINDEX, TROPONINI,  in the last 168 hours  BNP (last  3 results) No results found for this basename: PROBNP,  in the last 8760 hours CBG: No results found for this basename: GLUCAP,  in the last 168 hours  Radiological Exams on Admission: Ct Head Wo Contrast  09/06/2013   CLINICAL DATA:  Confusion  EXAM: CT HEAD WITHOUT CONTRAST  TECHNIQUE: Contiguous axial images were obtained from the base of the skull through the vertex without intravenous contrast.  COMPARISON:  Prior MRI from 09/07/2013  FINDINGS: Vasogenic edema is seen throughout the posterior fossa, grossly similar as compared to previous MRI, compatible with known leptomeningeal metastases. The fourth ventricle is mildly compressed, unchanged. Ventricle size is slightly increased as compared to the prior study without frank evidence of hydrocephalus. No transependymal flow CSF. There is crowding of the basilar cisterns.  No acute intracranial hemorrhage or large vessel territory infarct. No extra-axial fluid collection. Calvarium is intact. Orbits are within normal limits.  Paranasal sinuses are clear. Sequelae of prior canal wall up mastoidectomy noted on the right. Left mastoid air cells are clear.  IMPRESSION: 1. Vasogenic edema within the bilateral cerebellar hemispheres with secondary mass effect on the fourth ventricle. Finding is compatible with known intracranial leptomeningeal metastases, and better evaluated on recent MRI from 09/07/2013. Overall size of the lateral and third ventricles is increased as compared to the previous examination, worrisome for possible developing hydrocephalus. 2. No acute intracranial hemorrhage.   Electronically Signed   By: Jeannine Boga M.D.   On: 09/09/2013 04:16     Assessment/Plan Principal Problem:   Metastasis to brain Active Problems:   HTN (hypertension)   Hypothyroidism   Lung cancer   Hyponatremia   Brain metastasis   1. Brain metastasis with known history of stage IV lung cancer - at this time concerning for developing  hydrocephalus. Patient's Decadron dose has been increased. Radiation oncologist Dr. Lisbeth Renshaw has to be notified about the admission in a.m. Dr. Orpah Clinton oncologist to follow for further recommendations. Patient will be continued on pain relief medications. Placed on neurochecks. 2. 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 Place patient on fluid restriction. Closely follow metabolic panel. Check urine sodium and osmolality. 3. Hypertension - continue home medications. 4. Hypothyroidism - check TSH. Continue Synthroid. 5. Hyperlipidemia - continue home  medications.  I have reviewed patient's old charts and labs. Palliative team consult requested.  Code Status: Full code.  Family Communication: Patient's husband at the bedside.  Disposition Plan: Admit to inpatient.    Daina Cara N. [facility] Pager [phone].  If 7PM-7AM, please contact night-coverage [email or web address] Password [facility] 09/06/2013, 6:34 AM

## 2013-09-16 NOTE — Progress Notes (Signed)
eLink Physician-Brief Progress Note Patient Name: Tenecia Ignasiak DOB: 07/01/1961 MRN: [id number]  Date of Service  09/22/2013   HPI/Events of Note   Fever, hypotension, suspected aspiration  eICU Interventions   Zosyn / Vancomycin started   Intervention Category Major Interventions: Sepsis - evaluation and management  Rod Majerus 09/05/2013, 7:09 PM

## 2013-09-16 NOTE — Progress Notes (Signed)
Met with family (including Parks Neptune, husband) at bedside for update. Family is aware of the above mentioned events including bedside intubation and transfer to the ICU. Family is aware of pt's overall poor prognosis. Family is in agreement with current plan of care. All questions answered. Will defer further management to Critical Care service.

## 2013-09-16 NOTE — Progress Notes (Signed)
Patient GD:Jodi Shepherd      DOB: Oct 18, 1960      MHD:[id number]  Consult received.  In the interim patient required intubation after being found unresponsive.  Discussed case with Dr. Wyline Copas and Dr. Onnie Graham.  CCM will evaluate patient and I will touch base with them in them before moving forward.   Rosie Golson L. Lovena Le, MD MBA The Palliative Medicine Team at [facility] Phone: [phone] Pager: [phone]

## 2013-09-16 NOTE — Progress Notes (Signed)
Upon admission to the 4th floor patient was on 2L of oxygen with an O2 level of 87%. BP slightly elevated. Assessed patient and noticed that she was lethargic and not responding to verbal stimuli. Paged Dr. Wyline Copas, rapid response and respiratory therapy. Dr. Wyline Copas told us to get her down to ICU. Patient immediately transferred to ICU.  Report given to rapid response nurse on the way down to ICU. I stayed at bedside until all questions were answered by receiving nurse and receiving MD.

## 2013-09-16 NOTE — ED Provider Notes (Signed)
CSN: [id number]     Arrival date & time 08/25/2013  3568 History   First MD Initiated Contact with Patient 08/29/2013 0309     Chief Complaint  Patient presents with  . Altered Mental Status  . Cancer    brain, diagnosed this week,  mets   (Consider location/radiation/quality/duration/timing/severity/associated sxs/prior Treatment) HPI 53 year old female presents to emergency apartment from home with complaint of severe headache, confusion, dizziness.  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  She was seen by radiation oncology, who has recommended whole brain radiation.  She has had ongoing vertigo symptoms for last 6-8 weeks.  Husband reports yesterday afternoon around 1:00, she began to become more confused and disoriented.  He is also noted worsening difficulties with walking and balance.  Around 1 AM, she began to complain of a severe headache.  Patient is on oxycodone 30 mg when necessary pain, she took this with no improvement in her headache.  No nausea no vomiting.  No fever.  Headache is global and posterior cervical region. Past Medical History  Diagnosis Date  . Hypertension   . Hypothyroidism   . Anxiety   . Depression   . GERD (gastroesophageal reflux disease)   . Cancer     non- small cell squamous  . Shortness of breath   . COPD (chronic obstructive pulmonary disease)   . Headache(784.0)   . Arthritis     Hx; of osteoarthritis  . Diverticulosis     Hx: of   Past Surgical History  Procedure Laterality Date  . Abdominal hysterectomy    . Inner ear surgery    . Right -sided thoracic outlet surgery      Hx: of  . Colonoscopy w/ biopsies and polypectomy      Hx: of  . Rectal fistula repair      Hx: of  . Hemorrhoid surgery      Hx: of  . Portacath placement Bilateral 02/16/2013    Procedure: INSERTION PORT-A-CATH;  Surgeon: Ivin Poot, MD;  Location: [facility] OR;  Service: Thoracic;   Laterality: Bilateral;   Family History  Problem Relation Age of Onset  . Diabetes Mellitus II Father   . Heart failure Sister   . Emphysema Father     smoked  . Rheum arthritis Maternal Grandmother   . Skin cancer Mother   . Brain cancer Maternal Aunt   . Lung cancer Maternal Aunt     smoked  . Breast cancer Maternal Aunt    History  Substance Use Topics  . Smoking status: Former Smoker -- 1.00 packs/day for 20 years    Types: Cigarettes    Quit date: 12/31/2012  . Smokeless tobacco: Never Used  . Alcohol Use: No   OB History   Grav Para Term Preterm Abortions TAB SAB Ect Mult Living                 Review of Systems  Unable to perform ROS: Mental status change    Allergies  Review of patient's allergies indicates no known allergies.  Home Medications   Current Outpatient Rx  Name  Route  Sig  Dispense  Refill  . ALPRAZolam (XANAX) 0.25 MG tablet   Oral   Take 1 tablet (0.25 mg total) by mouth 3 (three) times daily as needed for anxiety.   30 tablet   0   . atenolol (TENORMIN) 50 MG tablet  50 mg 2 (two) times daily. One daily         . calcium-vitamin D (OSCAL WITH D) 500-200 MG-UNIT per tablet   Oral   Take 1 tablet by mouth every morning.         . carisoprodol (SOMA) 350 MG tablet   Oral   Take 350 mg by mouth 4 (four) times daily as needed for muscle spasms.         Marland Kitchen desonide (DESOWEN) 0.05 % ointment               . dexamethasone (DECADRON) 4 MG tablet   Oral   Take 1 tablet (4 mg total) by mouth 2 (two) times daily.   40 tablet   1   . docusate sodium (COLACE) 100 MG capsule   Oral   Take 100 mg by mouth as needed for constipation.         Marland Kitchen esomeprazole (NEXIUM) 40 MG capsule      Take 30-60 min before first meal of the day         . estradiol (ESTRACE) 2 MG tablet   Oral   Take 2 mg by mouth every morning.          . fexofenadine (ALLEGRA) 180 MG tablet   Oral   Take 180 mg by mouth daily as needed  (alleriges).          Marland Kitchen FLUoxetine (PROZAC) 20 MG capsule   Oral   Take 60 mg by mouth daily.          Marland Kitchen ibuprofen (ADVIL,MOTRIN) 800 MG tablet               . Iron-FA-B Cmp-C-Biot-Probiotic (FUSION PLUS) CAPS   Oral   Take 1 capsule by mouth daily.   30 capsule   3   . Isometheptene-Caffeine-APAP 65-20-325 MG TABS               . lamoTRIgine (LAMICTAL) 100 MG tablet   Oral   Take 100 mg by mouth 2 (two) times daily.          Marland Kitchen levothyroxine (SYNTHROID, LEVOTHROID) 50 MCG tablet   Oral   Take 50 mcg by mouth daily before breakfast.         . lidocaine-prilocaine (EMLA) cream   Topical   Apply 1 application topically as needed (to port site).   30 g   0   . lisinopril (PRINIVIL,ZESTRIL) 30 MG tablet   Oral   Take 30 mg by mouth daily.         Marland Kitchen LORazepam (ATIVAN) 1 MG tablet               . meclizine (ANTIVERT) 25 MG tablet   Oral   Take 25 mg by mouth as needed.         . Multiple Vitamin (MULTIVITAMIN) tablet   Oral   Take 1 tablet by mouth daily.         . NONFORMULARY OR COMPOUNDED ITEM   Topical   Apply 1 application topically 3 (three) times daily as needed (for pain). Special compounded cream from [facility] in [place], [place] ; contains ketamine, ketoprofen, amitriptyline.         . ondansetron (ZOFRAN) 8 MG tablet   Oral   Take 8 mg by mouth every 8 (eight) hours as needed.         Marland Kitchen oxybutynin (DITROPAN-XL) 10 MG 24 hr tablet   Oral  Take 10 mg by mouth at bedtime.         Marland Kitchen oxycodone (ROXICODONE) 30 MG immediate release tablet   Oral   Take 30 mg by mouth every 4 (four) hours as needed for pain.         Marland Kitchen prochlorperazine (COMPAZINE) 10 MG tablet   Oral   Take 1 tablet (10 mg total) by mouth every 6 (six) hours as needed (nausea).   60 tablet   0   . prochlorperazine (COMPAZINE) 25 MG suppository   Rectal   Place 1 suppository (25 mg total) rectally every 12 (twelve) hours as needed for nausea.   12  suppository   0   . simvastatin (ZOCOR) 40 MG tablet   Oral   Take 40 mg by mouth every evening.          BP 180/86  Pulse 56  Temp(Src) 98.5 F (36.9 C) (Oral)  Resp 20  SpO2 96% Physical Exam  Nursing note and vitals reviewed. Constitutional: She is oriented to person, place, and time. She appears well-developed and well-nourished. She appears distressed (uncomfortable appearing).  HENT:  Head: Normocephalic and atraumatic.  Right Ear: External ear normal.  Left Ear: External ear normal.  Nose: Nose normal.  Mouth/Throat: Oropharynx is clear and moist.  Eyes: Conjunctivae and EOM are normal. Pupils are equal, round, and reactive to light.  Neck: Normal range of motion. Neck supple. No JVD present. No tracheal deviation present. No thyromegaly present.  Cardiovascular: Normal rate, regular rhythm, normal heart sounds and intact distal pulses.  Exam reveals no gallop and no friction rub.   No murmur heard. Pulmonary/Chest: Effort normal and breath sounds normal. No stridor. No respiratory distress. She has no wheezes. She has no rales. She exhibits no tenderness.  Abdominal: Soft. Bowel sounds are normal. She exhibits no distension and no mass. There is no tenderness. There is no rebound and no guarding.  Musculoskeletal: Normal range of motion. She exhibits no edema and no tenderness.  Lymphadenopathy:    She has no cervical adenopathy.  Neurological: She is alert and oriented to person, place, and time. She has normal reflexes. No cranial nerve deficit. She exhibits normal muscle tone. Coordination (patient has abnormal finger-nose-finger, heel shin.) abnormal.  Skin: Skin is warm and dry. No rash noted. No erythema. No pallor.  Psychiatric: She has a normal mood and affect. Her behavior is normal. Judgment and thought content normal.    ED Course  Procedures (including critical care time) Labs Review Labs Reviewed  CBC WITH DIFFERENTIAL - Abnormal; Notable for the  following:    RBC 3.44 (*)    Hemoglobin 11.0 (*)    HCT 32.0 (*)    Monocytes Absolute 1.1 (*)    All other components within normal limits  BASIC METABOLIC PANEL - Abnormal; Notable for the following:    Sodium 129 (*)    Potassium 3.3 (*)    Chloride 89 (*)    Glucose, Bld 117 (*)    Creatinine, Ser 0.46 (*)    All other components within normal limits   Imaging Review Ct Head Wo Contrast  09/12/2013   CLINICAL DATA:  Confusion  EXAM: CT HEAD WITHOUT CONTRAST  TECHNIQUE: Contiguous axial images were obtained from the base of the skull through the vertex without intravenous contrast.  COMPARISON:  Prior MRI from 09/07/2013  FINDINGS: Vasogenic edema is seen throughout the posterior fossa, grossly similar as compared to previous MRI, compatible with known leptomeningeal metastases.  The fourth ventricle is mildly compressed, unchanged. Ventricle size is slightly increased as compared to the prior study without frank evidence of hydrocephalus. No transependymal flow CSF. There is crowding of the basilar cisterns.  No acute intracranial hemorrhage or large vessel territory infarct. No extra-axial fluid collection. Calvarium is intact. Orbits are within normal limits.  Paranasal sinuses are clear. Sequelae of prior canal wall up mastoidectomy noted on the right. Left mastoid air cells are clear.  IMPRESSION: 1. Vasogenic edema within the bilateral cerebellar hemispheres with secondary mass effect on the fourth ventricle. Finding is compatible with known intracranial leptomeningeal metastases, and better evaluated on recent MRI from 09/07/2013. Overall size of the lateral and third ventricles is increased as compared to the previous examination, worrisome for possible developing hydrocephalus. 2. No acute intracranial hemorrhage.   Electronically Signed   By: Jeannine Boga M.D.   On: 09/15/2013 04:16    EKG Interpretation   None       MDM   1. Hyponatremia   2. Secondary malignant  neoplasm of brain and spinal cord   3. Malignant neoplasm of bronchus and lung, unspecified site   4. Brain metastasis   5. HTN (hypertension)   6. Lung cancer    53 year old female with known brain metastasis, who now has worsening headache, confusion, and ataxia.  Plan for head CT, labs, Dilaudid for pain  4:34 AM Patient reports only minimal improvement with the Dilaudid.  She seemed more somnolent than earlier but does easily arouse to voice.  Results reviewed with patient and husband.  She has new hyponatremia and some worsening of her ventricle size concerning for developing hydrocephalus.  Will discuss with hospitalist for admission.  Will also consult neurosurgery, although I'm not sure there is much to be done by them.  Oncology recommends doubling her decadron dosing, will see in hospital, will need consult to rad/onc in hospital and possibly to pallative care.  Kalman Drape, MD 08/25/2013 0730

## 2013-09-16 NOTE — Progress Notes (Signed)
CRITICAL VALUE ALERT  Critical value received: troponin  Date of notification:  Sep 16 2013  Time of notification: 2130  Critical value read back:yes  Nurse who received alert:  Manuela Neptune RN  MD notified (1st page): Dr Earnest Conroy CCM  Time of first page: 2135 MD notified (2nd page):  Time of second page:  Responding MD:  DR Earnest Conroy  Time MD responded: 2135

## 2013-09-16 NOTE — Consult Note (Signed)
PULMONARY  / CRITICAL CARE MEDICINE  Name: Jodi Shepherd MRN: [id number] DOB: Jan 04, 1961 PCP WHITLOW, Daiva Eves., MD    ADMISSION DATE:  09/11/2013  LOS 0 days   CONSULTATION DATE:  08/24/2013  REFERRING MD :  Dr Marylu Lund  PRIMARY SERVICE: TR[facility] -> PCCM   CHIEF COMPLAINT:  Coma and intubated  BRIEF PATIENT DESCRIPTION: see HPI   SIGNIFICANT EVENTS / STUDIES:  09/05/2013 - admit 1/09/27/13 - move to ICU and tinbuated   LINES / TUBES: Left portocath ETT 09/02/2013 >>  CULTURES: Results for orders placed in visit on 03/31/13  URINE CULTURE     Status: None   Collection Time    03/31/13  3:53 PM      Result Value Range Status   Urine Culture, Routine Culture, Urine   Final   Comment: Final - ===== COLONY COUNT: =====     NO GROWTH     NO GROWTH     ANTIBIOTICS: Anti-infectives   None       HISTORY OF PRESENT ILLNESS: 53 year old female, full code. Followed by Dr Julien Nordmann. Diagnosed with stage 4 NSCLC (supraclav node) June 2014. 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 Followed by Dr Lisbeth Renshaw in office 09/15/13 with spine MRI and 2-3 week whole brain XRT set up in progress along with decardron. But presented to ER 09/15/2013 with headached and confusion and difficulty walking. CT head suggested evolving hydrocephalus. Also noticed to have low Na.  Then in afgernoon of 09/14/2013 found on medical floor unresponsive without ability to protect airway. Moved emergently to ICU and itnubated by CRNA. PCCM called for consult. At time of eval patient post parlytic and hypertensive on 07/27/14      PAST MEDICAL HISTORY :  Past Medical History  Diagnosis Date  . Hypertension   . Hypothyroidism   . Anxiety   . Depression   . GERD (gastroesophageal reflux disease)   . Cancer     non- small cell squamous  . Shortness of breath   . COPD (chronic obstructive pulmonary disease)   . Headache(784.0)   . Arthritis     Hx; of  osteoarthritis  . Diverticulosis     Hx: of     Family History  Problem Relation Age of Onset  . Diabetes Mellitus II Father   . Heart failure Sister   . Emphysema Father     smoked  . Rheum arthritis Maternal Grandmother   . Skin cancer Mother   . Brain cancer Maternal Aunt   . Lung cancer Maternal Aunt     smoked  . Breast cancer Maternal Aunt      History   Social History  . Marital Status: Married    Spouse Name: N/A    Number of Children: 2  . Years of Education: N/A   Occupational History  . Disabled    Social History Main Topics  . Smoking status: Former Smoker -- 1.00 packs/day for 20 years    Types: Cigarettes    Quit date: 12/31/2012  . Smokeless tobacco: Never Used  . Alcohol Use: No  . Drug Use: No  . Sexual Activity: Yes    Birth Control/ Protection: None   Other Topics Concern  . Not on file   Social History Narrative  . No narrative on file     No Known Allergies    (Not in an outpatient encounter)     REVIEW OF SYSTEMS:  Per HPI  SUBJECTIVE:   VITAL SIGNS: Filed Vitals:   08/27/2013 1000 08/25/2013 1100 09/04/2013 1217 09/13/2013 1322  BP: 192/84 171/84 169/92   Pulse: 74 76 81   Temp:   98.3 F (36.8 C)   TempSrc:   Oral   Resp: 21 22    Height:   _0  (1.651 m) _1  (1.549 m)  Weight:   77.7 kg (171 lb 4.8 oz)   SpO2: 90% 90% 88%          VENTILATOR SETTINGS: Vent Mode:  [-] PRVC FiO2 (%):  [30 %] 30 % Set Rate:  [28 bmp] 28 bmp Vt Set:  [420 mL] 420 mL PEEP:  [5 cmH20] 5 cmH20 Plateau Pressure:  [4 [id number] cmH20] 20 cmH20 INTAKE / OUTPUT:       PHYSICAL EXAMINATION: General:  Well built female in bed. Post intubation. Looks critically ill Neuro:  Unresponsieve post intubation HEENT:  PEERL. Not dilated Cardiovascular:  Tachycardic and hypertensive Lungs:  Coarse and ? Crack;es. In sync with vent Abdomen:  Soft, no mass. Normal bowel sounds Musculoskeletal:  No cyanosis. No clubbing. No edema Skin:   intact  LABS: PULMONARY No results found for this basename: PHART, PCO2, PCO2ART, PO2, PO2ART, HCO3, TCO2, O2SAT,  in the last 168 hours  CBC  Recent Labs Lab 08/29/2013 0335  HGB 11.0*  HCT 32.0*  WBC 10.1  PLT 265    COAGULATION No results found for this basename: INR,  in the last 168 hours  CARDIAC  No results found for this basename: TROPONINI,  in the last 168 hours No results found for this basename: PROBNP,  in the last 168 hours   CHEMISTRY  Recent Labs Lab 09/15/2013 0335  NA 129*  K 3.3*  CL 89*  CO2 28  GLUCOSE 117*  BUN 16  CREATININE 0.46*  CALCIUM 9.1   Estimated Creatinine Clearance: 75.9 ml/min (by C-G formula based on Cr of 0.46).   LIVER No results found for this basename: AST, ALT, ALKPHOS, BILITOT, PROT, ALBUMIN, INR,  in the last 168 hours   INFECTIOUS No results found for this basename: LATICACIDVEN, PROCALCITON,  in the last 168 hours   ENDOCRINE CBG (last 3)  No results found for this basename: GLUCAP,  in the last 72 hours       IMAGING x48h  Ct Head Wo Contrast  09/04/2013   CLINICAL DATA:  Confusion  EXAM: CT HEAD WITHOUT CONTRAST  TECHNIQUE: Contiguous axial images were obtained from the base of the skull through the vertex without intravenous contrast.  COMPARISON:  Prior MRI from 09/07/2013  FINDINGS: Vasogenic edema is seen throughout the posterior fossa, grossly similar as compared to previous MRI, compatible with known leptomeningeal metastases. The fourth ventricle is mildly compressed, unchanged. Ventricle size is slightly increased as compared to the prior study without frank evidence of hydrocephalus. No transependymal flow CSF. There is crowding of the basilar cisterns.  No acute intracranial hemorrhage or large vessel territory infarct. No extra-axial fluid collection. Calvarium is intact. Orbits are within normal limits.  Paranasal sinuses are clear. Sequelae of prior canal wall up mastoidectomy noted on the right.  Left mastoid air cells are clear.  IMPRESSION: 1. Vasogenic edema within the bilateral cerebellar hemispheres with secondary mass effect on the fourth ventricle. Finding is compatible with known intracranial leptomeningeal metastases, and better evaluated on recent MRI from 09/07/2013. Overall size of the lateral and third ventricles is increased as compared to the previous examination,  worrisome for possible developing hydrocephalus. 2. No acute intracranial hemorrhage.   Electronically Signed   By: Jeannine Boga M.D.   On: 09/05/2013 04:16       ASSESSMENT / PLAN:  PULMONARY A:Acute resp failure after being found comatose, ? aspiration P:   Full vent support Check abg Check cxr  CARDIOVASCULAR A: Hx of hypertension and currently hypertensive P:  Diprivan gtt and reassess  RENAL A:  At risk for renal failure. 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  This is a pre-terminal diagnosis in lung caner with 1-4 month prognosis. Current intubation could be her terminal event P:   Rpeat CT head to see if there is met related hge Once stable, get MRI BRain and spine Poor p  TODAY'S SUMMARY:   09/11/2013: husband enroute. [facility] discuss[facility] with him : full code. I am concerned she is pre-terminal or terminal.  Await more data before discussing with him. Will need to involve oncology     The patient is critically ill with multiple organ systems failure and requires high complexity decision making for  assessment and support, frequent evaluation and titration of therapies, application of advanced monitoring technologies and extensive interpretation of multiple databases.   Critical Care Time devoted to patient care services described in this note is  45 Minutes.    Dr. Brand Males, M.D., [facility].C.P Pulmonary and Critical Care Medicine Staff Physician [place] Pager: [phone], If no answer or between  15:00h - 7:00h: call [phone]  09/07/2013 2:00 PM

## 2013-09-17 ENCOUNTER — Other Ambulatory Visit: Payer: Self-pay | Admitting: Internal Medicine

## 2013-09-17 ENCOUNTER — Inpatient Hospital Stay (HOSPITAL_COMMUNITY): Payer: BC Managed Care – PPO

## 2013-09-17 DIAGNOSIS — Z515 Encounter for palliative care: Secondary | ICD-10-CM

## 2013-09-17 LAB — GLUCOSE, CAPILLARY
GLUCOSE-CAPILLARY: 125 mg/dL — AB (ref 70–99)
Glucose-Capillary: 114 mg/dL — ABNORMAL HIGH (ref 70–99)
Glucose-Capillary: 128 mg/dL — ABNORMAL HIGH (ref 70–99)
Glucose-Capillary: 145 mg/dL — ABNORMAL HIGH (ref 70–99)

## 2013-09-17 LAB — BASIC METABOLIC PANEL
BUN: 12 mg/dL (ref 6–23)
CO2: 29 mEq/L (ref 19–32)
Calcium: 8.5 mg/dL (ref 8.4–10.5)
Chloride: 93 mEq/L — ABNORMAL LOW (ref 96–112)
Creatinine, Ser: 0.57 mg/dL (ref 0.50–1.10)
Glucose, Bld: 126 mg/dL — ABNORMAL HIGH (ref 70–99)
Potassium: 4.3 mEq/L (ref 3.7–5.3)
SODIUM: 131 meq/L — AB (ref 137–147)

## 2013-09-17 LAB — LACTIC ACID, PLASMA: Lactic Acid, Venous: 1 mmol/L (ref 0.5–2.2)

## 2013-09-17 LAB — SODIUM, URINE, RANDOM: Sodium, Ur: 22 mEq/L

## 2013-09-17 LAB — MAGNESIUM: MAGNESIUM: 1.4 mg/dL — AB (ref 1.5–2.5)

## 2013-09-17 LAB — TROPONIN I: Troponin I: <0.3 ng/mL (ref <0.30)

## 2013-09-17 LAB — PROCALCITONIN: Procalcitonin: 20.93 ng/mL

## 2013-09-17 LAB — PHOSPHORUS: PHOSPHORUS: 3.4 mg/dL (ref 2.3–4.6)

## 2013-09-17 LAB — OSMOLALITY, URINE: Osmolality, Ur: 151 mOsm/kg — ABNORMAL LOW (ref 390–1090)

## 2013-09-17 MED ORDER — LORAZEPAM 2 MG/ML IJ SOLN
5.0000 mg/h | INTRAVENOUS | Status: DC
  Administered 2013-09-17: 2 mg/h via INTRAVENOUS
  Filled 2013-09-17: qty 25

## 2013-09-17 MED ORDER — LORAZEPAM BOLUS VIA INFUSION
2.0000 mg | INTRAVENOUS | Status: DC | PRN
  Filled 2013-09-17: qty 5

## 2013-09-17 MED ORDER — MAGNESIUM SULFATE 40 MG/ML IJ SOLN
2.0000 g | Freq: Once | INTRAMUSCULAR | Status: DC
  Filled 2013-09-17: qty 50

## 2013-09-17 MED ORDER — MORPHINE BOLUS VIA INFUSION
5.0000 mg | INTRAVENOUS | Status: DC | PRN
  Filled 2013-09-17: qty 20

## 2013-09-17 MED ORDER — MORPHINE SULFATE 10 MG/ML IJ SOLN
10.0000 mg/h | INTRAVENOUS | Status: DC
  Administered 2013-09-17: 5 mg/h via INTRAVENOUS
  Filled 2013-09-17: qty 10

## 2013-09-17 MED ORDER — PHENYLEPHRINE HCL 10 MG/ML IJ SOLN
30.0000 ug/min | INTRAVENOUS | Status: DC
  Administered 2013-09-17: 50 ug/min via INTRAVENOUS
  Administered 2013-09-17: 100 ug/min via INTRAVENOUS
  Filled 2013-09-17 (×2): qty 4

## 2013-09-17 MED ORDER — DEXTROSE 5 % IV SOLN: INTRAVENOUS | Status: DC

## 2013-09-18 DIAGNOSIS — Z515 Encounter for palliative care: Secondary | ICD-10-CM

## 2013-09-18 LAB — URINE CULTURE
Colony Count: NO GROWTH
Culture: NO GROWTH

## 2013-09-21 ENCOUNTER — Ambulatory Visit: Payer: BC Managed Care – PPO | Admitting: Physician Assistant

## 2013-09-21 ENCOUNTER — Other Ambulatory Visit: Payer: BC Managed Care – PPO

## 2013-09-21 ENCOUNTER — Ambulatory Visit: Payer: BC Managed Care – PPO

## 2013-09-22 LAB — CULTURE, BLOOD (ROUTINE X 2)
CULTURE: NO GROWTH
Culture: NO GROWTH

## 2013-09-24 NOTE — Progress Notes (Signed)
Bair hugger applied to patient at [facility] for continuing low body temp

## 2013-09-24 NOTE — Consult Note (Signed)
PULMONARY  / CRITICAL CARE MEDICINE  Name: Jodi Shepherd MRN: [id number] DOB: 03-13-1961 PCP WHITLOW, Daiva Eves., MD    ADMISSION DATE:  09/02/2013  LOS 1 days   CONSULTATION DATE:  09/06/2013  REFERRING MD :  Dr Marylu Lund  PRIMARY SERVICE: [facility] -> PCCM   CHIEF COMPLAINT:  Coma and intubated  BRIEF PATIENT DESCRIPTION: see HPI   SIGNIFICANT EVENTS / STUDIES:  09/11/2013 - admit 1/09/27/13 - move to ICU and tinbuated   LINES / TUBES: Left portocath ETT 08/25/2013 >>  CULTURES: Results for orders placed during the hospital encounter of 09/03/2013  MRSA PCR SCREENING     Status: None   Collection Time    09/09/2013  3:00 PM      Result Value Range Status   MRSA by PCR NEGATIVE  NEGATIVE Final   Comment:            The GeneXpert MRSA Assay (FDA     approved for NASAL specimens     only), is one component of a     comprehensive MRSA colonization     surveillance program. It is not     intended to diagnose MRSA     infection nor to guide or     monitor treatment for     MRSA infections.     ANTIBIOTICS: Anti-infectives   Start     Dose/Rate Route Frequency Ordered Stop   09/03/2013 2000  piperacillin-tazobactam (ZOSYN) IVPB 3.375 g     3.375 g 12.5 mL/hr over 240 Minutes Intravenous Every 8 hours 09/14/2013 1931     08/29/2013 2000  vancomycin (VANCOCIN) 1,250 mg in sodium chloride 0.9 % 250 mL IVPB     1,250 mg 166.7 mL/hr over 90 Minutes Intravenous Every 12 hours 09/04/2013 1931         SUBJECTIVE/OVERNIGHT/INTERVAL HX   1/25: Posturing, unresponsive off sedation, temperature labile (high and low), On pressors. Family present: Neice is neuro ICU nurse and says daughter "not getting it" but "geting there"  per RN.   Pupils not reactive. Breathing with vent. Posturing with pain response  VITAL SIGNS: Filed Vitals:   October 14, 2013 0500 2013/10/14 0530 14-Oct-2013 0600 Oct 14, 2013 0630  BP: 102/56 108/54 110/53 112/53  Pulse: 74 74 76 77  Temp: 99.5 F (37.5 C) 99.7 F (37.6 C)  99.9 F (37.7 C) 99.9 F (37.7 C)  TempSrc:      Resp: _0 Height:      Weight: 79.4 kg (175 lb 0.7 oz)     SpO2: 97% 97% 98% 98%         VENTILATOR SETTINGS: Vent Mode:  [-] PRVC FiO2 (%):  [30 %] 30 % Set Rate:  [28 bmp] 28 bmp Vt Set:  [420 mL] 420 mL PEEP:  [5 cmH20] 5 cmH20 Plateau Pressure:  [4 cmH20-21 cmH20] 20 cmH20 INTAKE / OUTPUT: I/O last 3 completed shifts: In: 1529.9 [I.V.:649.9; NG/GT:30; IV Piggyback:850] Out: 3760 [Urine:3760]     PHYSICAL EXAMINATION: General:  Well built female in bed. Post intubation. Looks critically ill Neuro:  Not on sedatiion: RASS -5, Pupils fixed and dilated, Mild RLE movement with deeep pain but does not breathe over vent HEENT:  Fixed and dilated pupls Cardiovascular:  In shock Lungs:  In sync with vent Abdomen:  Soft, no mass. Normal bowel sounds Musculoskeletal:  No cyanosis. No clubbing. No edema Skin:  intact  LABS: PULMONARY  Recent Labs Lab 09/09/2013 1403  PHART 7.369  PCO2ART 45.3*  PO2ART 314.0*  HCO3 25.5*  TCO2 23.0  O2SAT 99.4    CBC  Recent Labs Lab 09/20/2013 0335 09/21/2013 1501  HGB 11.0* 13.1  HCT 32.0* 37.3  WBC 10.1 9.7  PLT 265 244    COAGULATION No results found for this basename: INR,  in the last 168 hours  CARDIAC    Recent Labs Lab 09/10/2013 1515 09/02/2013 1952 09/30/2013 0030  TROPONINI <0.30 0.36* <0.30   No results found for this basename: PROBNP,  in the last 168 hours   CHEMISTRY  Recent Labs Lab 09/12/2013 0335 09/21/2013 1501 09/03/2013 1515 2013/09/30 0505  NA 129* 125*  --  131*  K 3.3* 3.4*  --  4.3  CL 89* 84*  --  93*  CO2 28 23  --  29  GLUCOSE 117* 139*  --  126*  BUN 16 13  --  12  CREATININE 0.46* 0.40*  --  0.57  CALCIUM 9.1 8.8  --  8.5  MG  --   --  1.1* 1.4*  PHOS  --   --  3.1 3.4   Estimated Creatinine Clearance: 76.7 ml/min (by C-G formula based on Cr of 0.57).   LIVER  Recent Labs Lab 09/03/2013 1501  AST 17  ALT 13  ALKPHOS  70  BILITOT 0.3  PROT 6.9  ALBUMIN 3.1*     INFECTIOUS  Recent Labs Lab 09/15/2013 1501 09/06/2013 1952 September 30, 2013 0505  LATICACIDVEN  --  1.3 1.0  PROCALCITON 5.25  --  20.93     ENDOCRINE CBG (last 3)   Recent Labs  09/01/2013 2042 09-30-2013 0014 09-30-2013 0358  GLUCAP 156* 125* 114*         IMAGING x48h  Ct Head Wo Contrast  08/26/2013   CLINICAL DATA:  Metastatic lung carcinoma. Hydrocephalus. Decreased mental status. Headache and confusion.  EXAM: CT HEAD WITHOUT CONTRAST  TECHNIQUE: Contiguous axial images were obtained from the base of the skull through the vertex without intravenous contrast.  COMPARISON:  Prior today on 08/31/2013  FINDINGS: Moderate hydrocephalus shows interval increase since previous study. Left frontal wound measures 17 mm compared to 11 mm previously.  Vasogenic edema is again seen in the cerebellar vermis in both cerebellar hemispheres, right side greater than left, with mass effect on the fourth ventricle. There is obliteration of the perimesencephalic cisterns. These findings are unchanged in appearance.  There is no evidence of intracranial hemorrhage.  IMPRESSION: Increased moderate hydrocephalus compared to prior study earlier today.  Stable appearance of vasogenic edema in the cerebellar vermis and bilateral cerebellar hemispheres secondary to metastatic disease. Stable mass effect on fourth ventricle and obliteration of perimesencephalic cisterns.  No evidence of intracranial hemorrhage.  These results were called by telephone at the time of interpretation on 09/18/2013 at 2:57 PM to the patient's ICU nurse Langley Gauss, who verbally acknowledged these results.   Electronically Signed   By: Earle Gell M.D.   On: 09/05/2013 14:58   Ct Head Wo Contrast  09/12/2013   CLINICAL DATA:  Confusion  EXAM: CT HEAD WITHOUT CONTRAST  TECHNIQUE: Contiguous axial images were obtained from the base of the skull through the vertex without intravenous contrast.   COMPARISON:  Prior MRI from 09/07/2013  FINDINGS: Vasogenic edema is seen throughout the posterior fossa, grossly similar as compared to previous MRI, compatible with known leptomeningeal metastases. The fourth ventricle is mildly compressed, unchanged. Ventricle size is slightly increased as compared to the prior study without frank evidence of hydrocephalus. No  transependymal flow CSF. There is crowding of the basilar cisterns.  No acute intracranial hemorrhage or large vessel territory infarct. No extra-axial fluid collection. Calvarium is intact. Orbits are within normal limits.  Paranasal sinuses are clear. Sequelae of prior canal wall up mastoidectomy noted on the right. Left mastoid air cells are clear.  IMPRESSION: 1. Vasogenic edema within the bilateral cerebellar hemispheres with secondary mass effect on the fourth ventricle. Finding is compatible with known intracranial leptomeningeal metastases, and better evaluated on recent MRI from 09/07/2013. Overall size of the lateral and third ventricles is increased as compared to the previous examination, worrisome for possible developing hydrocephalus. 2. No acute intracranial hemorrhage.   Electronically Signed   By: Jeannine Boga M.D.   On: 08/30/2013 04:16   Dg Chest Port 1 View  Sep 22, 2013   CLINICAL DATA:  Respiratory difficulty  EXAM: PORTABLE CHEST - 1 VIEW  COMPARISON:  08/24/2013  FINDINGS: Tubular device is stable. Right airspace disease stable. Increased left basilar airspace disease and left pleural effusion. No pneumothorax.  IMPRESSION: Stable right airspace disease.  Increased late basilar airspace disease and pleural effusion.   Electronically Signed   By: Maryclare Bean M.D.   On: 22-Sep-2013 07:12   Dg Chest Port 1 View  08/25/2013   CLINICAL DATA:  Respiratory failure.  EXAM: PORTABLE CHEST - 1 VIEW  COMPARISON:  07/12/2013  FINDINGS: Left subclavian Port-A-Cath is unchanged. A nasogastric tube courses through the region of the stomach  and off the inferior portion of the film as tip is not visualized. Endotracheal tube is present with tip 4 cm above the carina. Lungs are adequately inflated and demonstrate moderate airspace opacification over the right mid to lower lung and left retrocardiac region suggesting infection. No definite effusion. Cardiomediastinal silhouette and remainder the exam is unchanged.  IMPRESSION: Airspace process over the right mid to lower lung and left retrocardiac region suggesting infection.  Tubes and lines as described.   Electronically Signed   By: Marin Olp M.D.   On: 08/31/2013 14:16       ASSESSMENT / PLAN:  PULMONARY A:Acute resp failure due to herniation from brain mets  P:   Full vent support   CARDIOVASCULAR A: Hx of hypertension   2013/09/22: In shock due to likely ongling herniation. Had v tach with low mag  P:  Pressor support mag 2gm  RENAL A:  Low mag  P:   2gm mag IV  GASTROINTESTINAL A:  Nil acutte P:   NG tube to LIS  HEMATOLOGIC A:  Anemia of critical illness AT risk for DVT/PE P:  PRBC for hgb < 7gm% SCDs (dc lovenox in case there is  A bleed in brain met)   INFECTIOUS A:  Possible sepis  P:   Cultures and pct and lactate Hold off abx given impending herniation  ENDOCRINE A:  Nil acute . 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  This is a pre-terminal diagnosis in lung caner with 1-4 month prognosis. Current intubation could be her terminal event  1/25: CT is worse. Ongoing herniation. Nearing brain death  P:   Supportive care   ONC A: leptomeningeal carcinomatosis - worse P:   Preterminal dx explained to niece and sister   Global 1/25: Updated niece (neuro icu nurse at [place]) and sister: explained actively dying, Impending brain death.  Husband and dtr are "getting there". Advised to have  them be here next few hours; they live 2h  away. Niece prefers palliation and dnr but huband and dtr to make call; explained hours left. Palliative care consult will come in few hours but if family get here first and agree o palliation based on my conversation will cancel consult   The patient is critically ill with multiple organ systems failure and requires high complexity decision making for assessment and support, frequent evaluation and titration of therapies, application of advanced monitoring technologies and extensive interpretation of multiple databases.   Critical Care Time devoted to patient care services described in this note is  45 Minutes.    Dr. Brand Males, M.D., [facility].C.P Pulmonary and Critical Care Medicine Staff Physician [place] [facility] Pager: [phone], If no answer or between  15:00h - 7:00h: call [phone]  09-30-13 8:25 AM

## 2013-09-24 NOTE — Progress Notes (Addendum)
Family meet with husband, daughter, niece, sister, 3 other family members. Charlett Nose on speaker phone. Son from [facility] on way  Explained grim prognosis. Actively dying v brain dead.   They agreed on terminal wean but though she is unresponsive without gag or corenals and possibly brain dead they are requesting morphine/ativan to ensure confort. Doctrine of double effect explained. They understand. [place] requesteed. [facility] collectively and in unison felt that best not to wait for son from [place] because is too later   APalliative Care status  P Terminal wean  Dr. Brand Males, M.D., [facility].C.P Pulmonary and Critical Care Medicine Staff Physician [place] [facility] Pager: [phone], If no answer or between  15:00h - 7:00h: call [phone]  September 22, 2013 12:53 PM

## 2013-09-24 NOTE — Progress Notes (Signed)
Brief Nutrition Note Patient intubated, chart reviewed and discussed with nurse.  Nutrition intervention n/a secondary to poor prognosis.  Antonieta Iba, RD, LDN Clinical Inpatient Dietitian Pager:  [phone] Weekend and after hours pager:  [phone]

## 2013-09-24 NOTE — Progress Notes (Addendum)
Chaplain paged at [phone] and was in unit at [place] "Jodi Hawking" Shepherd is brain dead of cancer. Her family is at her bedside and in visitor room awaiting the terminal wean. They appear to be at peace with the grim reality and are coping well in anticipation of the final stage of her physical death.   Jodi Shepherd is a practicing Darrick Meigs, who is denominationally a Psychologist, forensic. She has remained faithful to her spiritual practice her entire life.  Family has a mixed grief response to the terminal wean procedures, but all agree that Jodi Shepherd deserved to die with dignity. She died quickly after diagnosis and this did not allow the family to anticipate her death.  Family prayer was prayed and grief counsel given to all present.  Page Bonney Roussel if family request further spiritual support.  Sallee Lange. Daymien Goth, [place]

## 2013-09-24 NOTE — Progress Notes (Signed)
60 ml of Morphine 250mg /271ml wasted in sink, and  30 ml of Ativan 1mg /ml wasted in sink.  Witnessed by Jacklynn Lewis RN and Barnetta Chapel RN

## 2013-09-24 DEATH — deceased

## 2013-09-25 ENCOUNTER — Other Ambulatory Visit (HOSPITAL_COMMUNITY): Payer: BC Managed Care – PPO

## 2013-09-25 ENCOUNTER — Ambulatory Visit (HOSPITAL_COMMUNITY): Payer: BC Managed Care – PPO

## 2013-09-25 ENCOUNTER — Other Ambulatory Visit: Payer: Self-pay | Admitting: Radiation Oncology

## 2013-09-25 NOTE — Discharge Summary (Signed)
DISCHARGE SUMMARY    Date of admit: 09/15/2013  3:00 AM Date of discharge: 22-Sep-2013  4:29 PM Length of Stay: 1 days  PCP is WHITLOW, Daiva Eves., MD   PROBLEM LIST Principal Problem:   Metastasis to brain Active Problems:   HTN (hypertension)   Hypothyroidism   Lung cancer   Hyponatremia   Brain metastasis   Acute respiratory failure with hypoxia   Coma   Palliative care status    SUMMARY Jodi Shepherd was 53 y.o. patient with    has a past medical history of Hypertension; Hypothyroidism; Anxiety; Depression; GERD (gastroesophageal reflux disease); Cancer; Shortness of breath; COPD (chronic obstructive pulmonary disease); Headache(784.0); Arthritis; and Diverticulosis.   has past surgical history that includes Abdominal hysterectomy; Inner ear surgery; Right -sided thoracic outlet surgery; Colonoscopy w/ biopsies and polypectomy; rectal fistula repair; Hemorrhoid surgery; and Portacath placement (Bilateral, 02/16/2013).   Admitted on 09/10/2013   Mid-Jan 2015 diagnosed with new brain mets and leptomeningeal carcinomatosis. Prior to admission she saw rad onc and palliative XRT planned but she was having worsening dizziness and admitted 08/24/2013. Her mental status quickly deteriorated and she becaome comatose. CT head showed worsening hydrocephalus and brain mets with progression between 09/14/2013 and 09/22/13. She was comatose. Goals of care discussion held with family. Explained that leptomeningeal carcinomatosis meant pre-terminal diagnosis and currently patient was terminal. A palliative wean performed at their consent based on best interest of patient and substituted judgement and she expired September 22, 2013    SIGNED Dr. Brand Males, M.D., [facility].C.P Pulmonary and Critical Care Medicine Staff Physician [place] [facility] Pager: [phone], If no answer or between  15:00h - 7:00h: call [phone]  09/25/2013 10:54  PM

## 2013-09-28 ENCOUNTER — Other Ambulatory Visit: Payer: BC Managed Care – PPO

## 2013-09-28 ENCOUNTER — Ambulatory Visit: Payer: BC Managed Care – PPO

## 2014-04-20 ENCOUNTER — Other Ambulatory Visit: Payer: Self-pay | Admitting: *Deleted

## 2014-05-26 IMAGING — CT CT ABD-PELV W/ CM
2 of 5 series · 16 of 46 positions shown, 18 images · IV contrast (OMNIPAQUE)
Comparison: CT 04/10/2013, PET-CT 02/02/2013.

CLINICAL DATA: Lung cancer restaging. Non-small cell lung cancer.
Chemotherapy ongoing.

EXAM:
CT CHEST, ABDOMEN, AND PELVIS WITH CONTRAST
TECHNIQUE: Multidetector CT imaging of the chest, abdomen and pelvis was
performed following the standard protocol during bolus
administration of intravenous contrast.
CONTRAST:  100mL OMNIPAQUE IOHEXOL 300 MG/ML  SOLN

[Series 2: cap with st · axial · 0.81mm/px · z∈[-356,+214]mm · 13 of 130 slices shown, 15 images]
[im 8/130  soft-tissue]
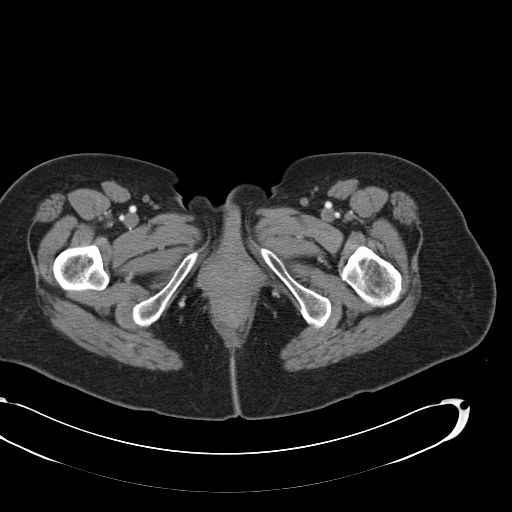
[im 8/130  bone]
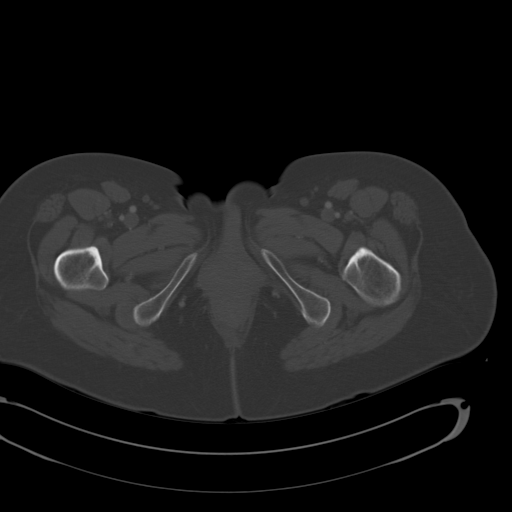
[im 16/130  soft-tissue]
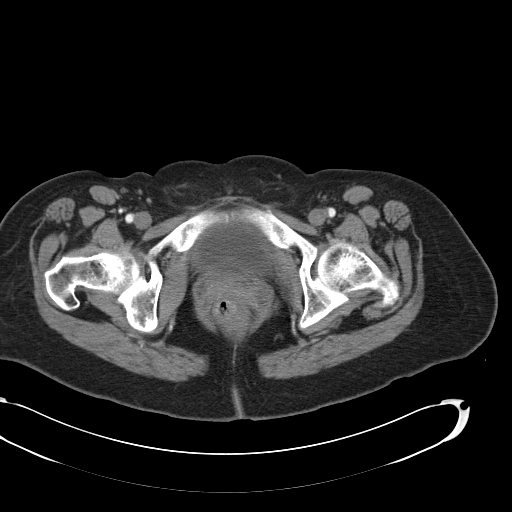
[im 31/130  soft-tissue]
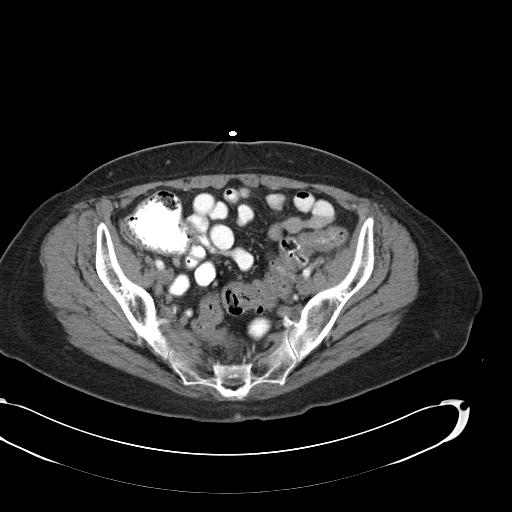
[im 38/130  soft-tissue]
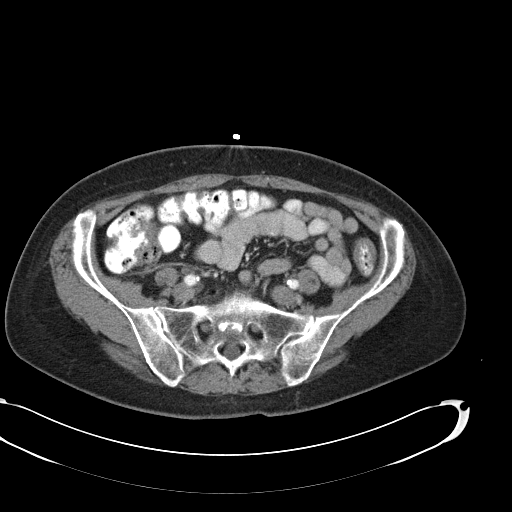
[im 46/130  soft-tissue]
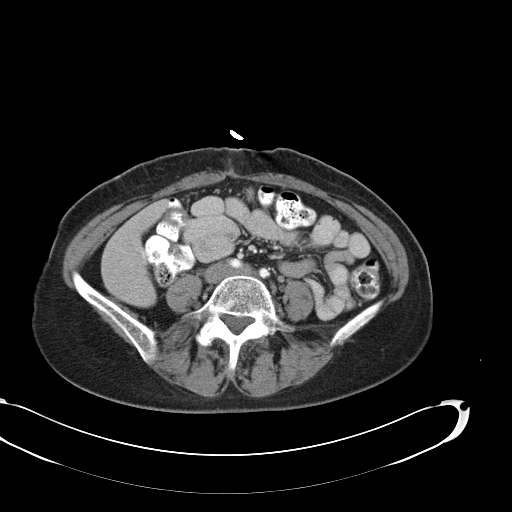
[im 54/130  soft-tissue]
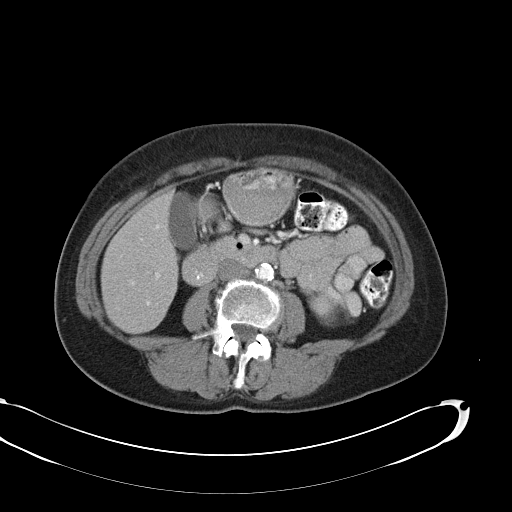
[im 69/130  soft-tissue]
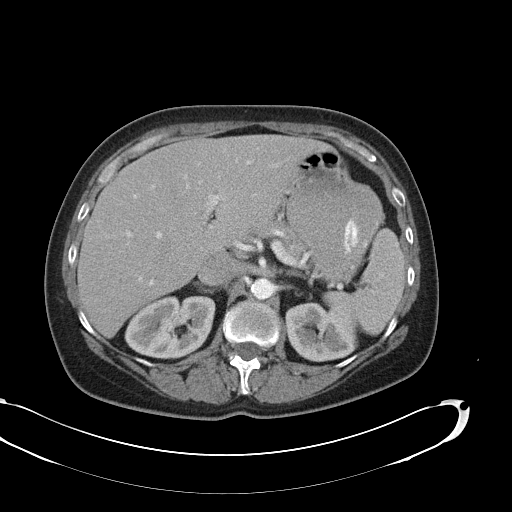
[im 76/130  soft-tissue]
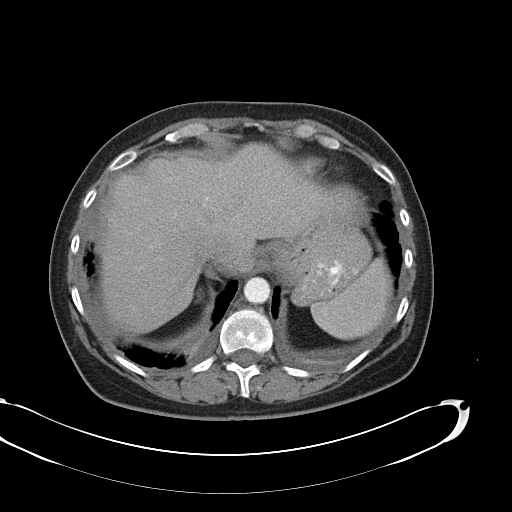
[im 84/130  soft-tissue]
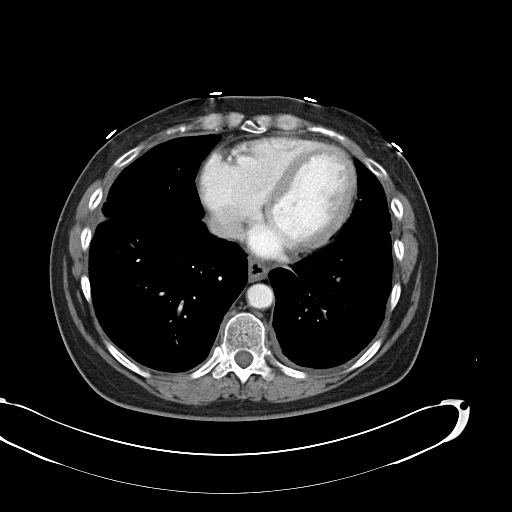
[im 84/130  bone]
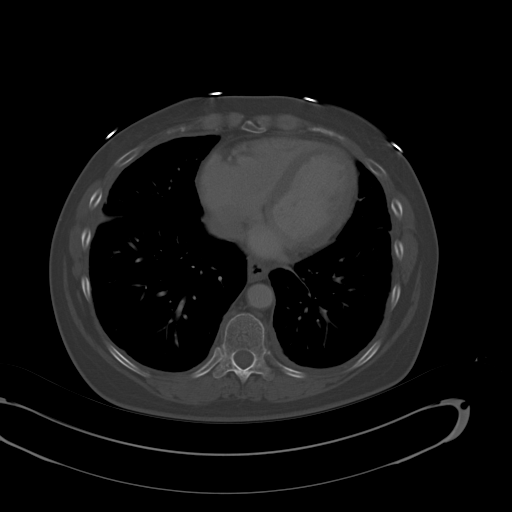
[im 92/130  soft-tissue]
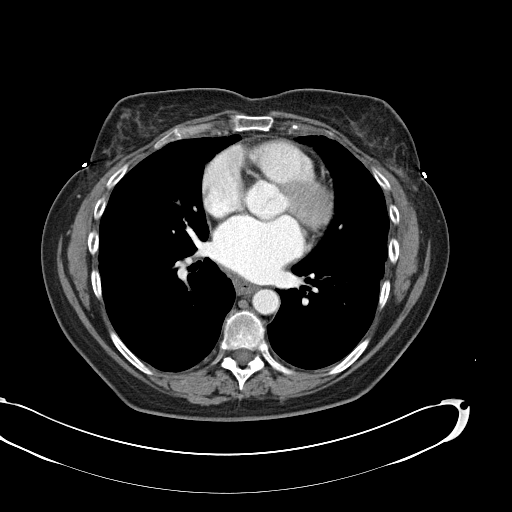
[im 99/130  soft-tissue]
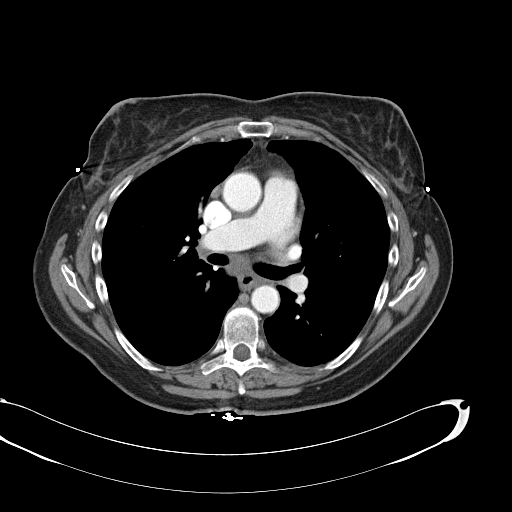
[im 114/130  soft-tissue]
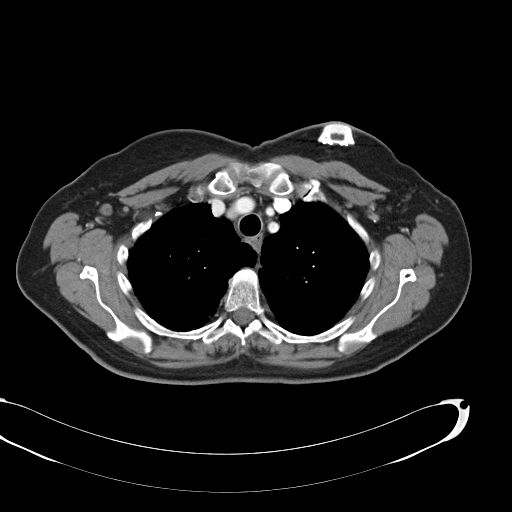
[im 122/130  soft-tissue]
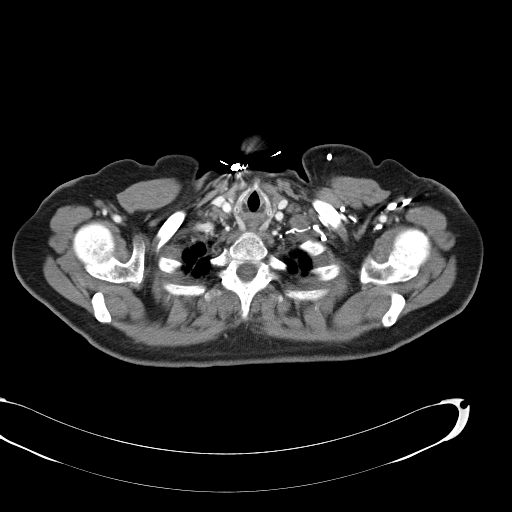

[Series 602: cor · coronal · 1.26mm/px · 3 of 120 slices shown]
[im 40/120  soft-tissue]
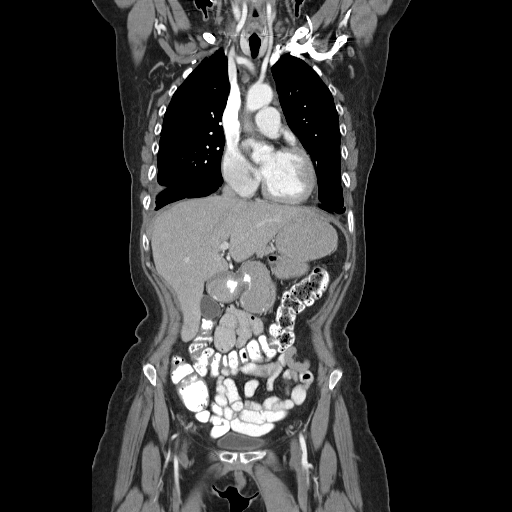
[im 53/120  soft-tissue]
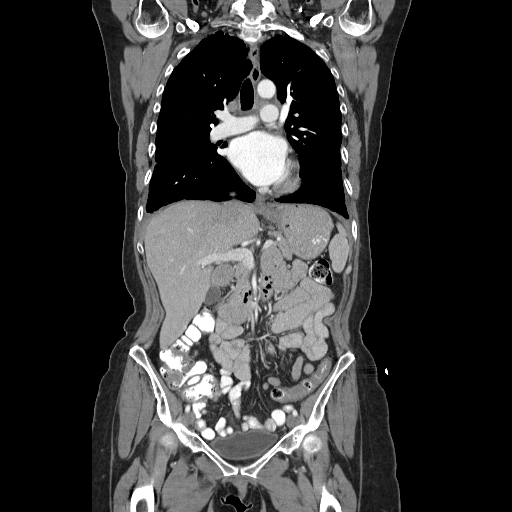
[im 67/120  soft-tissue]
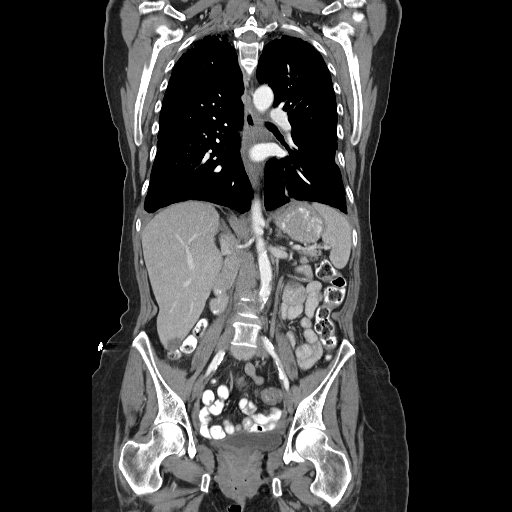

[16 of 46 positions shown; findings below may reference images not displayed]

FINDINGS: CT CHEST FINDINGS

Port in the right anterior chest wall. No axillary or
supraclavicular lymphadenopathy. Right paratracheal lymph node
measures 15 mm short axis compared 16 mm on prior. Subcarinal lymph
node measures 17 mm compared to 22 mm on prior. Prevascular lymph
nodes are also decreased in size with exemplary node measuring 6 mm
(image 25) compared to 9 mm on prior. No new mediastinal adenopathy.
No pericardial fluid.

Review of the lung parenchyma demonstrates no suspicious pulmonary
nodules. There is mild medial atelectasis at the lung bases.
Centrilobular emphysema and paraseptal emphysema in the upper lobes.
A pleural-based nodule in the superior aspect of the right lower
lobe measures 7 mm (image 29) is decreased from 8 mm on prior.

CT ABDOMEN AND PELVIS FINDINGS

Low-density lesion inferior right hepatic lobe is unchanged likely
represents a hemangioma as previously described. Gallbladder is
normal. The pancreas, spleen, kidneys are normal. Right adrenal
nodule measuring 16 mm unchanged from 16 mm on prior remeasured.
Smaller 10 mm left adrenal nodule is also unchanged. These nodules
are have metabolic activity similar to the liver suggesting a benign
lesions.

The stomach, small bowel, and cecum are normal.

Abdominal or is normal caliber. No retroperitoneal periportal
lymphadenopathy.

Interval decrease in the periportal lymphadenopathy described on
prior exams. No measurable adenopathy in the porta hepatis or
retroperitoneum. No pelvic lymphadenopathy.

The bladder is normal. Post hysterectomy anatomy. Next are normal.
Review of bone windows demonstrates no aggressive osseous lesions.
IMPRESSION: 1. Interval reduction in size of mediastinal lymph nodes.

2. Small pleural-based nodule in the right upper lobe is unchanged.

3.  No evidence disease progression in thorax.

4. No evidence of metastatic adenopathy in the abdomen or pelvis.
Reduction in periportal and retroperitoneal adenopathy.

5. Stable nodular enlargement adrenal glands likely represent
adenomas.

6. Stable hepatic hemangioma.
